# Patient Record
Sex: Male | Born: 1960 | Race: White | Hispanic: Yes | Marital: Married
Health system: Southern US, Academic
[De-identification: ages and names within clinical notes are randomized; demographics above are authoritative.]

## PROBLEM LIST (undated history)

## (undated) ENCOUNTER — Encounter

## (undated) ENCOUNTER — Telehealth

## (undated) ENCOUNTER — Ambulatory Visit: Payer: PRIVATE HEALTH INSURANCE

## (undated) ENCOUNTER — Encounter: Attending: Internal Medicine | Primary: Internal Medicine

## (undated) ENCOUNTER — Encounter: Attending: Physical Medicine & Rehabilitation | Primary: Physical Medicine & Rehabilitation

## (undated) ENCOUNTER — Telehealth: Attending: Registered" | Primary: Registered"

## (undated) ENCOUNTER — Ambulatory Visit: Attending: Internal Medicine | Primary: Internal Medicine

## (undated) ENCOUNTER — Ambulatory Visit

## (undated) ENCOUNTER — Encounter: Payer: PRIVATE HEALTH INSURANCE | Attending: Family | Primary: Family

## (undated) ENCOUNTER — Telehealth: Attending: Infectious Disease | Primary: Infectious Disease

## (undated) ENCOUNTER — Encounter: Attending: Radiation Oncology | Primary: Radiation Oncology

## (undated) ENCOUNTER — Telehealth: Attending: Clinical | Primary: Clinical

## (undated) ENCOUNTER — Encounter: Attending: Infectious Disease | Primary: Infectious Disease

## (undated) ENCOUNTER — Ambulatory Visit: Payer: PRIVATE HEALTH INSURANCE | Attending: Family | Primary: Family

## (undated) ENCOUNTER — Ambulatory Visit: Payer: PRIVATE HEALTH INSURANCE | Attending: Surgery | Primary: Surgery

## (undated) ENCOUNTER — Encounter
Payer: PRIVATE HEALTH INSURANCE | Attending: Physical Medicine & Rehabilitation | Primary: Physical Medicine & Rehabilitation

## (undated) ENCOUNTER — Non-Acute Institutional Stay: Payer: PRIVATE HEALTH INSURANCE

## (undated) ENCOUNTER — Encounter: Payer: PRIVATE HEALTH INSURANCE | Attending: Neurology | Primary: Neurology

## (undated) ENCOUNTER — Telehealth: Attending: Internal Medicine | Primary: Internal Medicine

## (undated) DIAGNOSIS — F32A Depression, unspecified: Secondary | ICD-10-CM

## (undated) DIAGNOSIS — J189 Pneumonia, unspecified organism: Secondary | ICD-10-CM

## (undated) DIAGNOSIS — F329 Major depressive disorder, single episode, unspecified: Secondary | ICD-10-CM

## (undated) DIAGNOSIS — F419 Anxiety disorder, unspecified: Secondary | ICD-10-CM

## (undated) DIAGNOSIS — K8020 Calculus of gallbladder without cholecystitis without obstruction: Secondary | ICD-10-CM

## (undated) DIAGNOSIS — F1020 Alcohol dependence, uncomplicated: Secondary | ICD-10-CM

## (undated) DIAGNOSIS — B20 Human immunodeficiency virus [HIV] disease: Secondary | ICD-10-CM

## (undated) DIAGNOSIS — N189 Chronic kidney disease, unspecified: Secondary | ICD-10-CM

## (undated) DIAGNOSIS — M1990 Unspecified osteoarthritis, unspecified site: Secondary | ICD-10-CM

## (undated) DIAGNOSIS — G510 Bell's palsy: Secondary | ICD-10-CM

## (undated) DIAGNOSIS — I639 Cerebral infarction, unspecified: Secondary | ICD-10-CM

## (undated) DIAGNOSIS — I10 Essential (primary) hypertension: Secondary | ICD-10-CM

## (undated) DIAGNOSIS — K219 Gastro-esophageal reflux disease without esophagitis: Secondary | ICD-10-CM

## (undated) DIAGNOSIS — Z21 Asymptomatic human immunodeficiency virus [HIV] infection status: Secondary | ICD-10-CM

## (undated) DIAGNOSIS — D649 Anemia, unspecified: Secondary | ICD-10-CM

## (undated) HISTORY — PX: COLONOSCOPY W/ POLYPECTOMY: SHX1380

## (undated) HISTORY — DX: Unspecified osteoarthritis, unspecified site: M19.90

## (undated) HISTORY — PX: CHOLECYSTECTOMY: SHX55

## (undated) HISTORY — DX: Alcohol dependence, uncomplicated: F10.20

## (undated) MED ORDER — GABAPENTIN 600 MG TABLET: tablet | 2 refills | 0 days

## (undated) MED ORDER — GABAPENTIN 600 MG TABLET: 900 mg | tablet | Freq: Three times a day (TID) | 2 refills | 30 days | Status: CN

## (undated) MED ORDER — NAPROXEN 500 MG TABLET,DELAYED RELEASE: Freq: Two times a day (BID) | ORAL | 0 days

---
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
## 2006-01-08 ENCOUNTER — Other Ambulatory Visit: Payer: Self-pay

## 2006-01-09 ENCOUNTER — Inpatient Hospital Stay: Payer: Self-pay | Admitting: Unknown Physician Specialty

## 2006-01-12 ENCOUNTER — Other Ambulatory Visit: Payer: Self-pay

## 2012-12-26 DIAGNOSIS — F3341 Major depressive disorder, recurrent, in partial remission: Secondary | ICD-10-CM | POA: Insufficient documentation

## 2014-03-27 DIAGNOSIS — F1010 Alcohol abuse, uncomplicated: Secondary | ICD-10-CM | POA: Insufficient documentation

## 2014-03-27 DIAGNOSIS — H9319 Tinnitus, unspecified ear: Secondary | ICD-10-CM | POA: Insufficient documentation

## 2015-02-26 DIAGNOSIS — K219 Gastro-esophageal reflux disease without esophagitis: Secondary | ICD-10-CM | POA: Insufficient documentation

## 2015-05-18 ENCOUNTER — Ambulatory Visit
Admission: EM | Admit: 2015-05-18 | Discharge: 2015-05-18 | Disposition: A | Discharge status: Home / Self Care | Payer: Self-pay | Attending: Family Medicine | Admitting: Family Medicine

## 2015-05-18 ENCOUNTER — Encounter: Payer: Self-pay | Admitting: *Deleted

## 2015-05-18 DIAGNOSIS — J411 Mucopurulent chronic bronchitis: Secondary | ICD-10-CM

## 2015-05-18 DIAGNOSIS — J0100 Acute maxillary sinusitis, unspecified: Secondary | ICD-10-CM

## 2015-05-18 DIAGNOSIS — J9801 Acute bronchospasm: Secondary | ICD-10-CM

## 2015-05-18 HISTORY — DX: Major depressive disorder, single episode, unspecified: F32.9

## 2015-05-18 HISTORY — DX: Asymptomatic human immunodeficiency virus (hiv) infection status: Z21

## 2015-05-18 HISTORY — DX: Anxiety disorder, unspecified: F41.9

## 2015-05-18 HISTORY — DX: Essential (primary) hypertension: I10

## 2015-05-18 HISTORY — DX: Depression, unspecified: F32.A

## 2015-05-18 HISTORY — DX: Human immunodeficiency virus (HIV) disease: B20

## 2015-05-18 MED ORDER — SULFAMETHOXAZOLE-TRIMETHOPRIM 800-160 MG PO TABS
1.0000 | ORAL_TABLET | Freq: Two times a day (BID) | ORAL | Status: DC
Start: 2015-05-18 — End: 2016-05-04

## 2015-05-18 MED ORDER — BENZONATATE 200 MG PO CAPS: 200.0000 mg | ORAL_CAPSULE | Freq: Three times a day (TID) | ORAL | Status: DC | PRN

## 2015-05-18 MED ORDER — ALBUTEROL SULFATE HFA 108 (90 BASE) MCG/ACT IN AERS: 1.0000 | INHALATION_SPRAY | Freq: Four times a day (QID) | RESPIRATORY_TRACT | Status: DC | PRN

## 2015-05-18 NOTE — ED Provider Notes (Signed)
CSN: [id number]     Arrival date & time 05/18/15  1219 History   First MD Initiated Contact with Patient 05/18/15 1330     Chief Complaint  Patient presents with  . Dizziness  . Cough   (Consider location/radiation/quality/duration/timing/severity/associated sxs/prior Treatment) HPI Comments: 54 yo male with chronic hypertension, HIV disease, presents  with a 2-3 weeks h/o progressively worsening productive cough, wheezing, chills, sinus pressure, nasal congestion, and headaches. States prior to these symptoms, he had "the flu". Patient is a smoker 1 ppd.   Patient is a 54 y.o. male presenting with dizziness and cough. The history is provided by the patient.  Dizziness Cough   Past Medical History  Diagnosis Date  . Hypertension   . Depression   . Anxiety   . HIV (human immunodeficiency virus infection) ([place])    History reviewed. No pertinent past surgical history. History reviewed. No pertinent family history. Social History  Substance Use Topics  . Smoking status: Current Every Day Smoker -- 1.00 packs/day    Types: Cigarettes  . Smokeless tobacco: Never Used  . Alcohol Use: Yes    Review of Systems  Respiratory: Positive for cough.   Neurological: Positive for dizziness.    Allergies  Review of patient's allergies indicates no known allergies.  Home Medications   Prior to Admission medications   Medication Sig Start Date End Date Taking? Authorizing Provider  gabapentin (NEURONTIN) 600 MG tablet Take 600 mg by mouth 3 (three) times daily.   Yes Historical Provider, MD  lisinopril-hydrochlorothiazide (PRINZIDE,ZESTORETIC) 20-25 MG tablet Take 1 tablet by mouth daily.   Yes Historical Provider, MD  omeprazole (PRILOSEC) 20 MG capsule Take 20 mg by mouth daily.   Yes Historical Provider, MD  pravastatin (PRAVACHOL) 40 MG tablet Take 40 mg by mouth daily.   Yes Historical Provider, MD  sertraline (ZOLOFT) 100 MG tablet Take 200 mg by mouth daily.   Yes Historical  Provider, MD  albuterol (PROVENTIL HFA;VENTOLIN HFA) 108 (90 BASE) MCG/ACT inhaler Inhale 1-2 puffs into the lungs every 6 (six) hours as needed for wheezing or shortness of breath. 05/18/15   Norval Gable, MD  benzonatate (TESSALON) 200 MG capsule Take 1 capsule (200 mg total) by mouth 3 (three) times daily as needed for cough. 05/18/15   Norval Gable, MD  sulfamethoxazole-trimethoprim (BACTRIM DS,SEPTRA DS) 800-160 MG tablet Take 1 tablet by mouth 2 (two) times daily. 05/18/15   Norval Gable, MD   Meds Ordered and Administered this Visit  Medications - No data to display  BP 145/70 mmHg  Pulse 91  Temp(Src) 98.9 F (37.2 C) (Oral)  Resp 18  Ht 5\' 10"  (1.778 m)  Wt 210 lb (95.255 kg)  BMI 30.13 kg/m2  SpO2 95% No data found.   Physical Exam  Constitutional: He appears well-developed and well-nourished. No distress.  HENT:  Head: Normocephalic and atraumatic.  Right Ear: Tympanic membrane, external ear and ear canal normal.  Left Ear: Tympanic membrane, external ear and ear canal normal.  Nose: Mucosal edema and rhinorrhea present.  Mouth/Throat: Uvula is midline, oropharynx is clear and moist and mucous membranes are normal. No oropharyngeal exudate, posterior oropharyngeal edema, posterior oropharyngeal erythema or tonsillar abscesses.  Eyes: Conjunctivae and EOM are normal. Pupils are equal, round, and reactive to light. Right eye exhibits no discharge. Left eye exhibits no discharge. No scleral icterus.  Neck: Normal range of motion. Neck supple. No tracheal deviation present. No thyromegaly present.  Cardiovascular: Normal rate, regular rhythm and normal  heart sounds.   Pulmonary/Chest: Effort normal. No stridor. No respiratory distress. He has wheezes. He has no rales. He exhibits no tenderness.  Diffuse rhonchi and wheezes  Lymphadenopathy:    He has no cervical adenopathy.  Neurological: He is alert.  Skin: Skin is warm and dry. No rash noted. He is not diaphoretic.   Nursing note and vitals reviewed.   ED Course  Procedures (including critical care time)  Labs Review Labs Reviewed - No data to display  Imaging Review No results found.   Visual Acuity Review  Right Eye Distance:   Left Eye Distance:   Bilateral Distance:    Right Eye Near:   Left Eye Near:    Bilateral Near:         MDM   1. Mucopurulent chronic bronchitis ([place])   2. Bronchospasm   3. Acute maxillary sinusitis, recurrence not specified    New Prescriptions   ALBUTEROL (PROVENTIL HFA;VENTOLIN HFA) 108 (90 BASE) MCG/ACT INHALER    Inhale 1-2 puffs into the lungs every 6 (six) hours as needed for wheezing or shortness of breath.   BENZONATATE (TESSALON) 200 MG CAPSULE    Take 1 capsule (200 mg total) by mouth 3 (three) times daily as needed for cough.   SULFAMETHOXAZOLE-TRIMETHOPRIM (BACTRIM DS,SEPTRA DS) 800-160 MG TABLET    Take 1 tablet by mouth 2 (two) times daily.    Plan: 1.Test/x-ray results and diagnosis reviewed with patient 2.Rx as per orders;  benefits, risks, potential side effects reviewed  3.Recommend supportive treatment with increased fluids, otc analgesics prn 4. Seek additional medical care if symptoms worsen or are not improving  Norval Gable, MD 05/18/15 1359

## 2015-05-18 NOTE — ED Notes (Signed)
Patient started having symptoms of cough and chest congestion Monday a week ago with symptoms getting worse this past Friday.  His cough have been productive, but unsure of color.

## 2016-05-04 ENCOUNTER — Emergency Department: Payer: Self-pay

## 2016-05-04 ENCOUNTER — Encounter: Payer: Self-pay | Admitting: Emergency Medicine

## 2016-05-04 ENCOUNTER — Observation Stay
Admission: EM | Admit: 2016-05-04 | Discharge: 2016-05-05 | Disposition: A | Discharge status: Home / Self Care | Payer: Self-pay | Attending: Specialist | Admitting: Specialist

## 2016-05-04 DIAGNOSIS — M6281 Muscle weakness (generalized): Secondary | ICD-10-CM

## 2016-05-04 DIAGNOSIS — I10 Essential (primary) hypertension: Secondary | ICD-10-CM | POA: Insufficient documentation

## 2016-05-04 DIAGNOSIS — Z7982 Long term (current) use of aspirin: Secondary | ICD-10-CM | POA: Insufficient documentation

## 2016-05-04 DIAGNOSIS — Z8249 Family history of ischemic heart disease and other diseases of the circulatory system: Secondary | ICD-10-CM | POA: Insufficient documentation

## 2016-05-04 DIAGNOSIS — I639 Cerebral infarction, unspecified: Secondary | ICD-10-CM

## 2016-05-04 DIAGNOSIS — E781 Pure hyperglyceridemia: Secondary | ICD-10-CM | POA: Insufficient documentation

## 2016-05-04 DIAGNOSIS — F329 Major depressive disorder, single episode, unspecified: Secondary | ICD-10-CM | POA: Insufficient documentation

## 2016-05-04 DIAGNOSIS — F419 Anxiety disorder, unspecified: Secondary | ICD-10-CM | POA: Insufficient documentation

## 2016-05-04 DIAGNOSIS — G510 Bell's palsy: Secondary | ICD-10-CM | POA: Insufficient documentation

## 2016-05-04 DIAGNOSIS — Z79899 Other long term (current) drug therapy: Secondary | ICD-10-CM | POA: Insufficient documentation

## 2016-05-04 DIAGNOSIS — Z809 Family history of malignant neoplasm, unspecified: Secondary | ICD-10-CM | POA: Insufficient documentation

## 2016-05-04 DIAGNOSIS — K219 Gastro-esophageal reflux disease without esophagitis: Secondary | ICD-10-CM | POA: Insufficient documentation

## 2016-05-04 DIAGNOSIS — R200 Anesthesia of skin: Secondary | ICD-10-CM

## 2016-05-04 DIAGNOSIS — E785 Hyperlipidemia, unspecified: Secondary | ICD-10-CM | POA: Insufficient documentation

## 2016-05-04 DIAGNOSIS — Z21 Asymptomatic human immunodeficiency virus [HIV] infection status: Secondary | ICD-10-CM | POA: Insufficient documentation

## 2016-05-04 DIAGNOSIS — G459 Transient cerebral ischemic attack, unspecified: Principal | ICD-10-CM

## 2016-05-04 DIAGNOSIS — Z23 Encounter for immunization: Secondary | ICD-10-CM | POA: Insufficient documentation

## 2016-05-04 DIAGNOSIS — F17210 Nicotine dependence, cigarettes, uncomplicated: Secondary | ICD-10-CM | POA: Insufficient documentation

## 2016-05-04 DIAGNOSIS — F1010 Alcohol abuse, uncomplicated: Secondary | ICD-10-CM | POA: Insufficient documentation

## 2016-05-04 LAB — APTT: aPTT: 26 seconds (ref 24–36)

## 2016-05-04 LAB — COMPREHENSIVE METABOLIC PANEL
ALT: 28 U/L (ref 17–63)
ANION GAP: 10 (ref 5–15)
AST: 32 U/L (ref 15–41)
Albumin: 4.5 g/dL (ref 3.5–5.0)
Alkaline Phosphatase: 60 U/L (ref 38–126)
BILIRUBIN TOTAL: 0.5 mg/dL (ref 0.3–1.2)
BUN: 22 mg/dL — AB (ref 6–20)
CO2: 28 mmol/L (ref 22–32)
Calcium: 9.5 mg/dL (ref 8.9–10.3)
Chloride: 102 mmol/L (ref 101–111)
Creatinine, Ser: 1.23 mg/dL (ref 0.61–1.24)
GFR calc Af Amer: >60 mL/min (ref >60)
Glucose, Bld: 100 mg/dL — ABNORMAL HIGH (ref 65–99)
POTASSIUM: 4.4 mmol/L (ref 3.5–5.1)
Sodium: 140 mmol/L (ref 135–145)
TOTAL PROTEIN: 8.3 g/dL — AB (ref 6.5–8.1)

## 2016-05-04 LAB — DIFFERENTIAL
Basophils Absolute: 0 10*3/uL (ref 0–0.1)
Basophils Relative: 0 %
EOS ABS: 0.1 10*3/uL (ref 0–0.7)
EOS PCT: 1 %
LYMPHS ABS: 1.9 10*3/uL (ref 1.0–3.6)
Lymphocytes Relative: 28 %
MONO ABS: 0.5 10*3/uL (ref 0.2–1.0)
MONOS PCT: 8 %
Neutro Abs: 4.3 10*3/uL (ref 1.4–6.5)
Neutrophils Relative %: 63 %

## 2016-05-04 LAB — PROTIME-INR
INR: 0.87
Prothrombin Time: 11.8 seconds (ref 11.4–15.2)

## 2016-05-04 LAB — CBC
HEMATOCRIT: 44.6 % (ref 40.0–52.0)
HEMOGLOBIN: 15.3 g/dL (ref 13.0–18.0)
MCH: 33.4 pg (ref 26.0–34.0)
MCHC: 34.2 g/dL (ref 32.0–36.0)
MCV: 97.4 fL (ref 80.0–100.0)
Platelets: 148 10*3/uL — ABNORMAL LOW (ref 150–440)
RBC: 4.58 MIL/uL (ref 4.40–5.90)
RDW: 14.9 % — ABNORMAL HIGH (ref 11.5–14.5)
WBC: 6.8 10*3/uL (ref 3.8–10.6)

## 2016-05-04 LAB — GLUCOSE, CAPILLARY: Glucose-Capillary: 110 mg/dL — ABNORMAL HIGH (ref 65–99)

## 2016-05-04 LAB — TROPONIN I

## 2016-05-04 LAB — LIPID PANEL
Cholesterol: 215 mg/dL — ABNORMAL HIGH (ref 0–200)
HDL: 72 mg/dL (ref >40)
LDL CALC: UNDETERMINED mg/dL (ref 0–99)
Total CHOL/HDL Ratio: 3 RATIO
Triglycerides: 488 mg/dL — ABNORMAL HIGH (ref <150)
VLDL: UNDETERMINED mg/dL (ref 0–40)

## 2016-05-04 MED ORDER — LORAZEPAM 2 MG PO TABS: 2.0000 mg | ORAL_TABLET | ORAL | Status: DC | PRN

## 2016-05-04 MED ORDER — MIRTAZAPINE 15 MG PO TABS
30.0000 mg | ORAL_TABLET | Freq: Every day | ORAL | Status: DC
  Administered 2016-05-04: 30 mg via ORAL
  Filled 2016-05-04: qty 2

## 2016-05-04 MED ORDER — LORAZEPAM 2 MG/ML IJ SOLN: 0.0000 mg | INTRAMUSCULAR | Status: DC

## 2016-05-04 MED ORDER — LORAZEPAM 2 MG/ML IJ SOLN: 0.0000 mg | Freq: Two times a day (BID) | INTRAMUSCULAR | Status: DC

## 2016-05-04 MED ORDER — ADULT MULTIVITAMIN W/MINERALS CH
1.0000 | ORAL_TABLET | Freq: Every day | ORAL | Status: DC
  Administered 2016-05-04 – 2016-05-05 (×2): 1 via ORAL
  Filled 2016-05-04 (×2): qty 1

## 2016-05-04 MED ORDER — INFLUENZA VAC SPLIT QUAD 0.5 ML IM SUSY
0.5000 mL | PREFILLED_SYRINGE | INTRAMUSCULAR | Status: AC
  Administered 2016-05-05: 09:00:00 0.5 mL via INTRAMUSCULAR
  Filled 2016-05-04: qty 0.5

## 2016-05-04 MED ORDER — NALTREXONE HCL 50 MG PO TABS
50.0000 mg | ORAL_TABLET | Freq: Every day | ORAL | Status: DC
  Administered 2016-05-05: 50 mg via ORAL
  Filled 2016-05-04: qty 1

## 2016-05-04 MED ORDER — THIAMINE HCL 100 MG/ML IJ SOLN
100.0000 mg | Freq: Every day | INTRAMUSCULAR | Status: DC
  Filled 2016-05-04 (×2): qty 1

## 2016-05-04 MED ORDER — ASPIRIN EC 81 MG PO TBEC
81.0000 mg | DELAYED_RELEASE_TABLET | Freq: Every day | ORAL | Status: DC
  Administered 2016-05-05: 09:00:00 81 mg via ORAL
  Filled 2016-05-04: qty 1

## 2016-05-04 MED ORDER — FOLIC ACID 1 MG PO TABS
1.0000 mg | ORAL_TABLET | Freq: Every day | ORAL | Status: DC
  Administered 2016-05-04 – 2016-05-05 (×2): 1 mg via ORAL
  Filled 2016-05-04 (×2): qty 1

## 2016-05-04 MED ORDER — LORAZEPAM 2 MG/ML IJ SOLN: 0.0000 mg | Freq: Four times a day (QID) | INTRAMUSCULAR | Status: DC

## 2016-05-04 MED ORDER — ELVITEG-COBIC-EMTRICIT-TENOFAF 150-150-200-10 MG PO TABS
1.0000 | ORAL_TABLET | Freq: Every day | ORAL | Status: DC
  Administered 2016-05-05: 1 via ORAL
  Filled 2016-05-04: qty 1

## 2016-05-04 MED ORDER — HYDROCHLOROTHIAZIDE 25 MG PO TABS
25.0000 mg | ORAL_TABLET | Freq: Every day | ORAL | Status: DC
  Administered 2016-05-05: 25 mg via ORAL
  Filled 2016-05-04: qty 1

## 2016-05-04 MED ORDER — PANTOPRAZOLE SODIUM 40 MG PO TBEC
40.0000 mg | DELAYED_RELEASE_TABLET | Freq: Every day | ORAL | Status: DC
  Administered 2016-05-05: 40 mg via ORAL
  Filled 2016-05-04: qty 1

## 2016-05-04 MED ORDER — SERTRALINE HCL 50 MG PO TABS
200.0000 mg | ORAL_TABLET | Freq: Every day | ORAL | Status: DC
  Administered 2016-05-05: 09:00:00 200 mg via ORAL
  Filled 2016-05-04: qty 4

## 2016-05-04 MED ORDER — LORAZEPAM 2 MG/ML IJ SOLN: 1.0000 mg | Freq: Four times a day (QID) | INTRAMUSCULAR | Status: DC | PRN

## 2016-05-04 MED ORDER — SODIUM CHLORIDE 0.9% FLUSH
3.0000 mL | Freq: Two times a day (BID) | INTRAVENOUS | Status: DC
  Administered 2016-05-04 – 2016-05-05 (×2): 3 mL via INTRAVENOUS

## 2016-05-04 MED ORDER — HEPARIN SODIUM (PORCINE) 5000 UNIT/ML IJ SOLN
5000.0000 [IU] | Freq: Three times a day (TID) | INTRAMUSCULAR | Status: DC
  Administered 2016-05-04 – 2016-05-05 (×3): 5000 [IU] via SUBCUTANEOUS
  Filled 2016-05-04 (×3): qty 1

## 2016-05-04 MED ORDER — ZINC SULFATE 220 (50 ZN) MG PO CAPS
220.0000 mg | ORAL_CAPSULE | Freq: Every day | ORAL | Status: DC
  Administered 2016-05-04 – 2016-05-05 (×2): 220 mg via ORAL
  Filled 2016-05-04 (×2): qty 1

## 2016-05-04 MED ORDER — VITAMIN B-1 100 MG PO TABS
100.0000 mg | ORAL_TABLET | Freq: Every day | ORAL | Status: DC
  Administered 2016-05-04 – 2016-05-05 (×2): 100 mg via ORAL
  Filled 2016-05-04 (×2): qty 1

## 2016-05-04 MED ORDER — LISINOPRIL 20 MG PO TABS
40.0000 mg | ORAL_TABLET | Freq: Every day | ORAL | Status: DC
Start: 2016-05-05 — End: 2016-05-05
  Administered 2016-05-05: 09:00:00 40 mg via ORAL
  Filled 2016-05-04: qty 2

## 2016-05-04 MED ORDER — GABAPENTIN 300 MG PO CAPS
600.0000 mg | ORAL_CAPSULE | Freq: Three times a day (TID) | ORAL | Status: DC
  Administered 2016-05-04 – 2016-05-05 (×3): 600 mg via ORAL
  Filled 2016-05-04 (×3): qty 2

## 2016-05-04 MED ORDER — FLUCONAZOLE 100 MG PO TABS: 150.0000 mg | ORAL_TABLET | ORAL | Status: DC

## 2016-05-04 MED ORDER — PRAVASTATIN SODIUM 40 MG PO TABS
40.0000 mg | ORAL_TABLET | Freq: Every day | ORAL | Status: DC
  Administered 2016-05-05: 09:00:00 40 mg via ORAL
  Filled 2016-05-04: qty 1

## 2016-05-04 NOTE — ED Triage Notes (Signed)
Pt presents with reports of inabilty to close his right eye when he wants to, right side facial droop and numbness and tingling to his left arm.

## 2016-05-04 NOTE — Progress Notes (Signed)
Chaplain was responding to a code stroke pager by a nurse. When the chaplain arrived the patient had been taken in have a CT scan, and when they brought him back, the chaplain located his son who was waiting outside to came and see his Dad. Chaplain offered silent prayer and was to do a follow-up.      05/04/16 1353  Clinical Encounter Type  Visited With Patient  Visit Type Spiritual support  Consult/Referral To Nurse  Spiritual Encounters  Spiritual Needs Prayer  Advance Directives (For Healthcare)  Does patient have an advance directive? No

## 2016-05-04 NOTE — H&P (Signed)
[facility] [place] NAME: Daryl Shelton    MR#:  [id number]  DATE OF BIRTH:  09-Feb-1961  DATE OF ADMISSION:  05/04/2016  PRIMARY CARE PHYSICIAN: Foye Spurling, MD   REQUESTING/REFERRING PHYSICIAN: Hoyle Sauer  CHIEF COMPLAINT:   Chief Complaint  Patient presents with  . Code Stroke    HISTORY OF PRESENT ILLNESS: Daryl Shelton  is a 54 y.o. male with a known history of HIV, Htn, Anxiety, Ch smoking and drinking alcohol, Came to ER after having left sided facial weakness noticed today morning, then also had weakness in left arm- which recovered now. He claims to have arm weakness in past also. In ER, CT head and MRI brain are negative- Seen by neurologist- Dr. Jules Husbands- and as per ER physicians- she suggested to admit for TIA work ups.  PAST MEDICAL HISTORY:   Past Medical History:  Diagnosis Date  . Anxiety   . Depression   . HIV (human immunodeficiency virus infection) ([place])   . Hypertension     PAST SURGICAL HISTORY: History reviewed. No pertinent surgical history.  SOCIAL HISTORY:  Social History  Substance Use Topics  . Smoking status: Current Every Day Smoker    Packs/day: 1.00    Types: Cigarettes  . Smokeless tobacco: Never Used  . Alcohol use Yes    FAMILY HISTORY:  Family History  Problem Relation Age of Onset  . CAD Father     DRUG ALLERGIES: No Known Allergies  REVIEW OF SYSTEMS:   CONSTITUTIONAL: No fever, fatigue or weakness.  EYES: No blurred or double vision.  EARS, NOSE, AND THROAT: No tinnitus or ear pain.  RESPIRATORY: No cough, shortness of breath, wheezing or hemoptysis.  CARDIOVASCULAR: No chest pain, orthopnea, edema.  GASTROINTESTINAL: No nausea, vomiting, diarrhea or abdominal pain.  GENITOURINARY: No dysuria, hematuria.  ENDOCRINE: No polyuria, nocturia,  HEMATOLOGY: No anemia, easy bruising or bleeding SKIN: No rash or lesion. MUSCULOSKELETAL: No joint pain or arthritis.   NEUROLOGIC: No  tingling, numbness, weakness.  PSYCHIATRY: No anxiety or depression.   MEDICATIONS AT HOME:  Prior to Admission medications   Medication Sig Start Date End Date Taking? Authorizing Provider  aspirin EC 81 MG tablet Take 81 mg by mouth daily.   Yes Historical Provider, MD  elvitegravir-cobicistat-emtricitabine-tenofovir (GENVOYA) 150-150-200-10 MG TABS tablet Take 1 tablet by mouth daily with breakfast.   Yes Historical Provider, MD  fluconazole (DIFLUCAN) 150 MG tablet Take 150 mg by mouth once a week.   Yes Historical Provider, MD  gabapentin (NEURONTIN) 600 MG tablet Take 600 mg by mouth 3 (three) times daily.   Yes Historical Provider, MD  hydrochlorothiazide (HYDRODIURIL) 25 MG tablet Take 25 mg by mouth daily.   Yes Historical Provider, MD  lisinopril (PRINIVIL,ZESTRIL) 40 MG tablet Take 40 mg by mouth daily.   Yes Historical Provider, MD  mirtazapine (REMERON) 30 MG tablet Take 30 mg by mouth at bedtime.   Yes Historical Provider, MD  naltrexone (DEPADE) 50 MG tablet Take 50 mg by mouth daily.   Yes Historical Provider, MD  omeprazole (PRILOSEC) 20 MG capsule Take 20 mg by mouth daily.   Yes Historical Provider, MD  pravastatin (PRAVACHOL) 40 MG tablet Take 40 mg by mouth daily.   Yes Historical Provider, MD  sertraline (ZOLOFT) 100 MG tablet Take 200 mg by mouth daily.   Yes Historical Provider, MD  zinc gluconate 50 MG tablet Take 50 mg by mouth daily.   Yes Historical Provider,  MD      PHYSICAL EXAMINATION:   VITAL SIGNS: Blood pressure (!) 148/90, pulse 82, temperature 98.1 F (36.7 C), resp. rate 18, height 5\' 10"  (1.778 m), weight 99.8 kg (220 lb), SpO2 100 %.  GENERAL:  55 y.o.-year-old patient lying in the bed with no acute distress.  EYES: Pupils equal, round, reactive to light and accommodation. No scleral icterus. Extraocular muscles intact.  HEENT: Head atraumatic, normocephalic. Oropharynx and nasopharynx clear.  NECK:  Supple, no jugular venous distention. No thyroid  enlargement, no tenderness.  LUNGS: Normal breath sounds bilaterally, no wheezing, rales,rhonchi or crepitation. No use of accessory muscles of respiration.  CARDIOVASCULAR: S1, S2 normal. No murmurs, rubs, or gallops.  ABDOMEN: Soft, nontender, nondistended. Bowel sounds present. No organomegaly or mass.  EXTREMITIES: No pedal edema, cyanosis, or clubbing.  NEUROLOGIC: Cranial nerves II through XII are intact except mild weakness on left face . Muscle strength 5/5 in all extremities. Sensation intact. Gait not checked.  PSYCHIATRIC: The patient is alert and oriented x 3.  SKIN: No obvious rash, lesion, or ulcer.   LABORATORY PANEL:   CBC  Recent Labs Lab 05/04/16 1351  WBC 6.8  HGB 15.3  HCT 44.6  PLT 148*  MCV 97.4  MCH 33.4  MCHC 34.2  RDW 14.9*  LYMPHSABS 1.9  MONOABS 0.5  EOSABS 0.1  BASOSABS 0.0   ------------------------------------------------------------------------------------------------------------------  Chemistries   Recent Labs Lab 05/04/16 1351  NA 140  K 4.4  CL 102  CO2 28  GLUCOSE 100*  BUN 22*  CREATININE 1.23  CALCIUM 9.5  AST 32  ALT 28  ALKPHOS 60  BILITOT 0.5   ------------------------------------------------------------------------------------------------------------------ estimated creatinine clearance is 79.4 mL/min (by C-G formula based on SCr of 1.23 mg/dL). ------------------------------------------------------------------------------------------------------------------ No results for input(s): TSH, T4TOTAL, T3FREE, THYROIDAB in the last 72 hours.  Invalid input(s): FREET3   Coagulation profile  Recent Labs Lab 05/04/16 1351  INR 0.87   ------------------------------------------------------------------------------------------------------------------- No results for input(s): DDIMER in the last 72  hours. -------------------------------------------------------------------------------------------------------------------  Cardiac Enzymes  Recent Labs Lab 05/04/16 1351  TROPONINI <0.03   ------------------------------------------------------------------------------------------------------------------ Invalid input(s): POCBNP  ---------------------------------------------------------------------------------------------------------------  Urinalysis No results found for: COLORURINE, APPEARANCEUR, LABSPEC, PHURINE, GLUCOSEU, HGBUR, BILIRUBINUR, KETONESUR, PROTEINUR, UROBILINOGEN, NITRITE, LEUKOCYTESUR   RADIOLOGY: Mr Brain Wo Contrast  Result Date: 05/04/2016 CLINICAL DATA:  55 year old male with right side facial droop and numbness. Unable to close right eye. Tingling in the left upper extremity. Initial encounter. EXAM: MRI HEAD WITHOUT CONTRAST TECHNIQUE: Multiplanar, multiecho pulse sequences of the brain and surrounding structures were obtained without intravenous contrast. COMPARISON:  Head CT without contrast 1342 hours today. FINDINGS: Brain: No restricted diffusion to suggest acute infarction. No midline shift, mass effect, evidence of mass lesion, ventriculomegaly, extra-axial collection or acute intracranial hemorrhage. Cervicomedullary junction and pituitary are within normal limits. Pearline Cables and white matter signal is within normal limits for age throughout the brain. No cortical encephalomalacia. No chronic cerebral blood products. Visible internal auditory structures appear normal. Stylomastoid foramina appear normal. See parotid gland findings below. Vascular: Major intracranial vascular flow voids appear normal. Skull and upper cervical spine: Negative. Normal bone marrow signal. Sinuses/Orbits: Visualized paranasal sinuses and mastoids are stable and well pneumatized. Orbits soft tissues appear normal. Negative scalp soft tissues. Other: Normal left parotid gland. Along the  posterior margin of the right parotid space there is a 17 by 12 by 24 mm (AP by transverse by CC) soft tissue nodule with heterogeneous T1 and T2 signal. This is circumscribed. The  remainder of the right parotid gland appears normal. Visible face soft tissues appear normal. IMPRESSION: 1. No acute intracranial abnormality. Normal for age noncontrast MRI appearance of the brain. 2. Posterior right parotid space small 2.4 cm soft tissue nodule. This appears unrelated to the presentation of right facial weakness, and benign etiology (such as Warthin's or Benign Mixed Tumor) is favored. Recommend outpatient follow-up with ENT. Electronically Signed   By: Genevie Ann M.D.   On: 05/04/2016 15:02   Ct Head Code Stroke W/o Cm  Result Date: 05/04/2016 CLINICAL DATA:  Code stroke. Left-sided facial droop beginning 7.5 hours ago. EXAM: CT HEAD WITHOUT CONTRAST TECHNIQUE: Contiguous axial images were obtained from the base of the skull through the vertex without intravenous contrast. COMPARISON:  None. FINDINGS: Brain: No acute infarct, hemorrhage, or mass lesion is present. Basal ganglia are intact. Insular ribbon is normal. No focal cortical defect is present. Mild atrophy is within normal limits for age. The ventricles are of normal size. No significant extra-axial fluid collection is present. The brainstem and cerebellum are normal. Vascular: No significant vascular calcifications are present. Skull: The calvarium is intact. Sinuses/Orbits: The paranasal sinuses and mastoid air cells are clear. Other: ASPECTS ([place] Stroke Program Early CT Score) - Ganglionic level infarction (caudate, lentiform nuclei, internal capsule, insula, M1-M3 cortex): 7/7 - Supraganglionic infarction (M4-M6 cortex): 3/3 Total score (0-10 with 10 being normal): 10/10 IMPRESSION: 1. Normal CT of the brain. 2. ASPECTS is 10/10 These results were called by telephone at the time of interpretation on 05/04/2016 at 1:54 pm to Dr. Doy Mince , who verbally  acknowledged these results. Electronically Signed   By: San Morelle M.D.   On: 05/04/2016 13:55    EKG: Orders placed or performed during the hospital encounter of 05/04/16  . ED EKG  . ED EKG  . EKG 12-Lead  . EKG 12-Lead    IMPRESSION AND PLAN:  * TIA   MRI negative   Echo, Carotid doppler, PT eval, HbA1c and lipid panel.  * Htn   Cont home meds  * HIV   Cont home meds, follows at [facility]  * Active smoking   Counceleld to quit smoking for 4 min, give nicotin patch in hospital  * Alcohol abuse   High risk for withdrawal.   Watch for symptoms, keep on CIWA    All the records are reviewed and case discussed with ED provider. Management plans discussed with the patient, family and they are in agreement.  CODE STATUS: FUll Code Status History    This patient does not have a recorded code status. Please follow your organizational policy for patients in this situation.       TOTAL TIME TAKING CARE OF THIS PATIENT: 45 minutes.    Vaughan Basta M.D on 05/04/2016   Between 7am to 6pm - Pager - [phone]  After 6pm go to [email or web address] - password [email or web address] [facility]  Office  [phone]  CC: Primary care physician; Foye Spurling, MD   Note: This dictation was prepared with Dragon dictation along with smaller phrase technology. Any transcriptional errors that result from this process are unintentional.

## 2016-05-04 NOTE — ED Provider Notes (Signed)
[facility] Emergency Department Provider Note  ____________________________________________  Time seen: Approximately 3:16 PM  I have reviewed the triage vital signs and the nursing notes.   HISTORY  Chief Complaint Code Stroke   HPI Daryl Shelton is a 55 y.o. male history of HIV, hypertension, anxiety, depression who presents for evaluation of left-sided facial droop and left-sided face and arm numbness. Patient reports that onset of symptoms was at 5:40 AM. He denies headache, chest pain, shortness of breath, palpitations, dizziness, gait instability. No prior history of stroke. No family history of stroke. Patient is a smoker. Patient was made a code STROKE on arrival.  Past Medical History:  Diagnosis Date  . Anxiety   . Depression   . HIV (human immunodeficiency virus infection) ([place])   . Hypertension     There are no active problems to display for this patient.   History reviewed. No pertinent surgical history.  Prior to Admission medications   Medication Sig Start Date End Date Taking? Authorizing Provider  aspirin EC 81 MG tablet Take 81 mg by mouth daily.   Yes Historical Provider, MD  elvitegravir-cobicistat-emtricitabine-tenofovir (GENVOYA) 150-150-200-10 MG TABS tablet Take 1 tablet by mouth daily with breakfast.   Yes Historical Provider, MD  fluconazole (DIFLUCAN) 150 MG tablet Take 150 mg by mouth once a week.   Yes Historical Provider, MD  gabapentin (NEURONTIN) 600 MG tablet Take 600 mg by mouth 3 (three) times daily.   Yes Historical Provider, MD  hydrochlorothiazide (HYDRODIURIL) 25 MG tablet Take 25 mg by mouth daily.   Yes Historical Provider, MD  lisinopril (PRINIVIL,ZESTRIL) 40 MG tablet Take 40 mg by mouth daily.   Yes Historical Provider, MD  mirtazapine (REMERON) 30 MG tablet Take 30 mg by mouth at bedtime.   Yes Historical Provider, MD  naltrexone (DEPADE) 50 MG tablet Take 50 mg by mouth daily.   Yes Historical  Provider, MD  omeprazole (PRILOSEC) 20 MG capsule Take 20 mg by mouth daily.   Yes Historical Provider, MD  pravastatin (PRAVACHOL) 40 MG tablet Take 40 mg by mouth daily.   Yes Historical Provider, MD  sertraline (ZOLOFT) 100 MG tablet Take 200 mg by mouth daily.   Yes Historical Provider, MD  zinc gluconate 50 MG tablet Take 50 mg by mouth daily.   Yes Historical Provider, MD    Allergies Review of patient's allergies indicates no known allergies.  No family history on file.  Social History Social History  Substance Use Topics  . Smoking status: Current Every Day Smoker    Packs/day: 1.00    Types: Cigarettes  . Smokeless tobacco: Never Used  . Alcohol use Yes    Review of Systems  Constitutional: Negative for fever. Eyes: Negative for visual changes. ENT: Negative for sore throat. Cardiovascular: Negative for chest pain. Respiratory: Negative for shortness of breath. Gastrointestinal: Negative for abdominal pain, vomiting or diarrhea. Genitourinary: Negative for dysuria. Musculoskeletal: Negative for back pain. Skin: Negative for rash. Neurological: Negative for headaches. + R facial droop and R sided numbness. No  weakness   ____________________________________________   PHYSICAL EXAM:  VITAL SIGNS: ED Triage Vitals  Enc Vitals Group     BP 05/04/16 1349 (!) 196/106     Pulse Rate 05/04/16 1349 92     Resp 05/04/16 1349 17     Temp 05/04/16 1349 98.1 F (36.7 C)     Temp Source 05/04/16 1349 Oral     SpO2 05/04/16 1349 100 %  Weight 05/04/16 1351 220 lb (99.8 kg)     Height 05/04/16 1351 5\' 10"  (1.778 m)     Head Circumference --      Peak Flow --      Pain Score --      Pain Loc --      Pain Edu? --      Excl. in [place]? --     Constitutional: Alert and oriented. Well appearing and in no apparent distress. HEENT:      Head: Normocephalic and atraumatic.         Eyes: Conjunctivae are normal. Sclera is non-icteric. EOMI. PERRL      Mouth/Throat:  Mucous membranes are moist.       Neck: Supple with no signs of meningismus. Cardiovascular: Regular rate and rhythm. No murmurs, gallops, or rubs. 2+ symmetrical distal pulses are present in all extremities. No JVD. Respiratory: Normal respiratory effort. Lungs are clear to auscultation bilaterally. No wheezes, crackles, or rhonchi.  Gastrointestinal: Soft, non tender, and non distended with positive bowel sounds. No rebound or guarding. Genitourinary: No CVA tenderness. Musculoskeletal: Nontender with normal range of motion in all extremities. No edema, cyanosis, or erythema of extremities. Neurologic: Normal speech and language. Left sided facial droop, remaining of CN II-XII intact. Strength and sensation intact. Negative dysmetria and pronator drift Skin: Skin is warm, dry and intact. No rash noted. Psychiatric: Mood and affect are normal. Speech and behavior are normal.  ____________________________________________   LABS (all labs ordered are listed, but only abnormal results are displayed)  Labs Reviewed  CBC - Abnormal; Notable for the following:       Result Value   RDW 14.9 (*)    Platelets 148 (*)    All other components within normal limits  COMPREHENSIVE METABOLIC PANEL - Abnormal; Notable for the following:    Glucose, Bld 100 (*)    BUN 22 (*)    Total Protein 8.3 (*)    All other components within normal limits  GLUCOSE, CAPILLARY - Abnormal; Notable for the following:    Glucose-Capillary 110 (*)    All other components within normal limits  PROTIME-INR  APTT  DIFFERENTIAL  TROPONIN I  CBG MONITORING, ED   ____________________________________________  EKG   ED ECG REPORT I, Rudene Re, the attending physician, personally viewed and interpreted this ECG.  Normal sinus rhythm, rate of 95, normal intervals, normal axis, no ST elevations or depressions, unchanged from prior. ____________________________________________  RADIOLOGY  Head CT:  Negative  MRI brain:  No acute intracranial abnormality. Normal for age noncontrast MRI appearance of the brain. ____________________________________________   PROCEDURES  Procedure(s) performed: None Procedures Critical Care performed:  None ____________________________________________   INITIAL IMPRESSION / ASSESSMENT AND PLAN / ED COURSE   55 y.o. male history of HIV, hypertension, anxiety, depression who presents for evaluation of left-sided facial droop and left-sided face and arm numbness. NIHSS 2 on arrival. Patient outside tPA window. The CT had an MRI with no acute findings. Patient evaluated by neurology who recommended admission for TIA workup. Discussed patient with the hospitalist for admission.  Clinical Course    Pertinent labs & imaging results that were available during my care of the patient were reviewed by me and considered in my medical decision making (see chart for details).    ____________________________________________   FINAL CLINICAL IMPRESSION(S) / ED DIAGNOSES  Final diagnoses:  Left sided numbness      NEW MEDICATIONS STARTED DURING THIS VISIT:  New Prescriptions  No medications on file     Note:  This document was prepared using Dragon voice recognition software and may include unintentional dictation errors.    Rudene Re, MD 05/04/16 [phone]

## 2016-05-04 NOTE — Consult Note (Signed)
Referring Physician: Alfred Levins    Chief Complaint: Left sided numbness  HPI: Daryl Shelton is a HIV positive 55 y.o. male with a history of hypertension who presents with acute onset of left facial droop with left face and arm paresthesias.  Symptoms have persisted throughout the day and with no improvement patient presented for evaluation.  Initial NIHSS of 2.    Date last known well: Date: 05/04/2016 Time last known well: Time: 05:40 tPA Given: No: Outside time window  MRankin: 0  Past Medical History:  Diagnosis Date  . Anxiety   . Depression   . HIV (human immunodeficiency virus infection) ([place])   . Hypertension     History reviewed. No pertinent surgical history.  Family history: Mother with cancer and hypertension.  Father with hypertension.  Maternal grandfather with HTN and heart disease  Social History:  reports that he has been smoking Cigarettes.  He has been smoking about 1.00 pack per day. He has never used smokeless tobacco. He reports that he drinks alcohol. He reports that he does not use drugs.  Allergies: No Known Allergies  Medications: I have reviewed the patient's current medications. Prior to Admission:  Prior to Admission medications   Medication Sig Start Date End Date Taking? Authorizing Provider  aspirin EC 81 MG tablet Take 81 mg by mouth daily.   Yes Historical Provider, MD  elvitegravir-cobicistat-emtricitabine-tenofovir (GENVOYA) 150-150-200-10 MG TABS tablet Take 1 tablet by mouth daily with breakfast.   Yes Historical Provider, MD  fluconazole (DIFLUCAN) 150 MG tablet Take 150 mg by mouth once a week.   Yes Historical Provider, MD  gabapentin (NEURONTIN) 600 MG tablet Take 600 mg by mouth 3 (three) times daily.   Yes Historical Provider, MD  hydrochlorothiazide (HYDRODIURIL) 25 MG tablet Take 25 mg by mouth daily.   Yes Historical Provider, MD  lisinopril (PRINIVIL,ZESTRIL) 40 MG tablet Take 40 mg by mouth daily.   Yes Historical Provider, MD   mirtazapine (REMERON) 30 MG tablet Take 30 mg by mouth at bedtime.   Yes Historical Provider, MD  naltrexone (DEPADE) 50 MG tablet Take 50 mg by mouth daily.   Yes Historical Provider, MD  omeprazole (PRILOSEC) 20 MG capsule Take 20 mg by mouth daily.   Yes Historical Provider, MD  pravastatin (PRAVACHOL) 40 MG tablet Take 40 mg by mouth daily.   Yes Historical Provider, MD  sertraline (ZOLOFT) 100 MG tablet Take 200 mg by mouth daily.   Yes Historical Provider, MD  zinc gluconate 50 MG tablet Take 50 mg by mouth daily.   Yes Historical Provider, MD   ROS: History obtained from the patient  General ROS: negative for - chills, fatigue, fever, night sweats, weight gain or weight loss Psychological ROS: negative for - behavioral disorder, hallucinations, memory difficulties, mood swings or suicidal ideation Ophthalmic ROS: negative for - blurry vision, double vision, eye pain or loss of vision ENT ROS: negative for - epistaxis, nasal discharge, oral lesions, sore throat, tinnitus or vertigo Allergy and Immunology ROS: negative for - hives or itchy/watery eyes Hematological and Lymphatic ROS: negative for - bleeding problems, bruising or swollen lymph nodes Endocrine ROS: negative for - galactorrhea, hair pattern changes, polydipsia/polyuria or temperature intolerance Respiratory ROS: negative for - cough, hemoptysis, shortness of breath or wheezing Cardiovascular ROS: negative for - chest pain, dyspnea on exertion, edema or irregular heartbeat Gastrointestinal ROS: negative for - abdominal pain, diarrhea, hematemesis, nausea/vomiting or stool incontinence Genito-Urinary ROS: negative for - dysuria, hematuria, incontinence or urinary  frequency/urgency Musculoskeletal ROS: negative for - joint swelling or muscular weakness Neurological ROS: as noted in HPI Dermatological ROS: negative for rash and skin lesion changes  Physical Examination: Blood pressure (!) 196/106, pulse 92, temperature 98.1  F (36.7 C), resp. rate 17, height 5\' 10"  (1.778 m), weight 99.8 kg (220 lb), SpO2 100 %.  HEENT-  Normocephalic, no lesions, without obvious abnormality.  Normal external eye and conjunctiva.  Normal TM's bilaterally.  Normal auditory canals and external ears. Normal external nose, mucus membranes and septum.  Normal pharynx. Cardiovascular- S1, S2 normal, pulses palpable throughout   Lungs- chest clear, no wheezing, rales, normal symmetric air entry Abdomen- soft, non-tender; bowel sounds normal; no masses,  no organomegaly Extremities- minimal LE edema Lymph-no adenopathy palpable Musculoskeletal-no joint tenderness, deformity or swelling Skin-warm and dry, no hyperpigmentation, vitiligo, or suspicious lesions  Neurological Examination Mental Status: Alert, oriented, thought content appropriate.  Speech fluent without evidence of aphasia.  Able to follow 3 step commands without difficulty. Cranial Nerves: II: Discs flat bilaterally; Visual fields grossly normal, pupils equal, round, reactive to light and accommodation III,IV, VI: ptosis not present, extra-ocular motions intact bilaterally V,VII: smile symmetric, facial light touch sensation decreased on the left  VIII: hearing normal bilaterally IX,X: gag reflex present XI: bilateral shoulder shrug XII: midline tongue extension Motor: Right : Upper extremity   5/5    Left:     Upper extremity   5/5  Lower extremity   5/5     Lower extremity   5/5 Tone and bulk:normal tone throughout; no atrophy noted Sensory: Pinprick and light touch intact throughout, bilaterally Deep Tendon Reflexes: 2+ and symmetric throughout Plantars: Right: downgoing   Left: downgoing Cerebellar: normal finger-to-nose, normal rapid alternating movements and normal heel-to-shin test Gait: normal gait and station    Laboratory Studies:  Basic Metabolic Panel: No results for input(s): NA, K, CL, CO2, GLUCOSE, BUN, CREATININE, CALCIUM, MG, PHOS in the last  168 hours.  Liver Function Tests: No results for input(s): AST, ALT, ALKPHOS, BILITOT, PROT, ALBUMIN in the last 168 hours. No results for input(s): LIPASE, AMYLASE in the last 168 hours. No results for input(s): AMMONIA in the last 168 hours.  CBC:  Recent Labs Lab 05/04/16 1351  WBC 6.8  NEUTROABS 4.3  HGB 15.3  HCT 44.6  MCV 97.4  PLT 148*    Cardiac Enzymes: No results for input(s): CKTOTAL, CKMB, CKMBINDEX, TROPONINI in the last 168 hours.  BNP: Invalid input(s): POCBNP  CBG:  Recent Labs Lab 05/04/16 1400  GLUCAP 110*    Microbiology: No results found for this or any previous visit.  Coagulation Studies: No results for input(s): LABPROT, INR in the last 72 hours.  Urinalysis: No results for input(s): COLORURINE, LABSPEC, PHURINE, GLUCOSEU, HGBUR, BILIRUBINUR, KETONESUR, PROTEINUR, UROBILINOGEN, NITRITE, LEUKOCYTESUR in the last 168 hours.  Invalid input(s): APPERANCEUR  Lipid Panel: No results found for: CHOL, TRIG, HDL, CHOLHDL, VLDL, LDLCALC  HgbA1C: No results found for: HGBA1C  Urine Drug Screen:  No results found for: LABOPIA, COCAINSCRNUR, LABBENZ, AMPHETMU, THCU, LABBARB  Alcohol Level: No results for input(s): ETH in the last 168 hours.  Other results: EKG: sinus rhythm at 95 bpm.  Imaging: Ct Head Code Stroke W/o Cm  Result Date: 05/04/2016 CLINICAL DATA:  Code stroke. Left-sided facial droop beginning 7.5 hours ago. EXAM: CT HEAD WITHOUT CONTRAST TECHNIQUE: Contiguous axial images were obtained from the base of the skull through the vertex without intravenous contrast. COMPARISON:  None. FINDINGS: Brain: No  acute infarct, hemorrhage, or mass lesion is present. Basal ganglia are intact. Insular ribbon is normal. No focal cortical defect is present. Mild atrophy is within normal limits for age. The ventricles are of normal size. No significant extra-axial fluid collection is present. The brainstem and cerebellum are normal. Vascular: No  significant vascular calcifications are present. Skull: The calvarium is intact. Sinuses/Orbits: The paranasal sinuses and mastoid air cells are clear. Other: ASPECTS ([place] Stroke Program Early CT Score) - Ganglionic level infarction (caudate, lentiform nuclei, internal capsule, insula, M1-M3 cortex): 7/7 - Supraganglionic infarction (M4-M6 cortex): 3/3 Total score (0-10 with 10 being normal): 10/10 IMPRESSION: 1. Normal CT of the brain. 2. ASPECTS is 10/10 These results were called by telephone at the time of interpretation on 05/04/2016 at 1:54 pm to Dr. Doy Mince , who verbally acknowledged these results. Electronically Signed   By: San Morelle M.D.   On: 05/04/2016 13:55    Assessment: 55 y.o.HIV positive male with a history of HTN who presents with left facial droop and left sided numbness.  Symptoms have persisted.  Head CT personally reviewed and shows no acute changes.  Patient on ASA at home.  With risk factors concern is for acute infarct.  Patient outside time window for tPA.  Further work up recommended.    Stroke Risk Factors - hypertension and smoking  Plan: 1. HgbA1c, fasting lipid panel 2. MRI, MRA  of the brain without contrast 3. PT consult, OT consult, Speech consult 4. Echocardiogram 5. Carotid dopplers 6. Prophylactic therapy-Antiplatelet med: Aspirin - dose 325mg  daily 7. NPO until RN stroke swallow screen 8. Telemetry monitoring 9. Frequent neuro checks  Case discussed with Dr. Unk Lightning, MD Neurology [phone] 05/04/2016, 2:16 PM

## 2016-05-04 NOTE — ED Notes (Signed)
Transported to CT with RN  

## 2016-05-05 ENCOUNTER — Observation Stay: Admit: 2016-05-05 | Payer: Self-pay

## 2016-05-05 ENCOUNTER — Observation Stay: Payer: Self-pay

## 2016-05-05 LAB — BASIC METABOLIC PANEL
ANION GAP: 7 (ref 5–15)
BUN: 17 mg/dL (ref 6–20)
CHLORIDE: 100 mmol/L — AB (ref 101–111)
CO2: 33 mmol/L — ABNORMAL HIGH (ref 22–32)
Calcium: 9.7 mg/dL (ref 8.9–10.3)
Creatinine, Ser: 1.21 mg/dL (ref 0.61–1.24)
GFR calc Af Amer: >60 mL/min (ref >60)
Glucose, Bld: 90 mg/dL (ref 65–99)
POTASSIUM: 4.4 mmol/L (ref 3.5–5.1)
SODIUM: 140 mmol/L (ref 135–145)

## 2016-05-05 LAB — CBC
HEMATOCRIT: 45.2 % (ref 40.0–52.0)
HEMOGLOBIN: 15.3 g/dL (ref 13.0–18.0)
MCH: 33.1 pg (ref 26.0–34.0)
MCHC: 33.9 g/dL (ref 32.0–36.0)
MCV: 97.7 fL (ref 80.0–100.0)
Platelets: 135 10*3/uL — ABNORMAL LOW (ref 150–440)
RBC: 4.62 MIL/uL (ref 4.40–5.90)
RDW: 14.7 % — AB (ref 11.5–14.5)
WBC: 4.8 10*3/uL (ref 3.8–10.6)

## 2016-05-05 LAB — HEMOGLOBIN A1C
Hgb A1c MFr Bld: 5.2 % (ref 4.8–5.6)
Mean Plasma Glucose: 103 mg/dL

## 2016-05-05 MED ORDER — NICOTINE 21 MG/24HR TD PT24
21.0000 mg | MEDICATED_PATCH | Freq: Every day | TRANSDERMAL | Status: DC
  Administered 2016-05-05: 21 mg via TRANSDERMAL
  Filled 2016-05-05: qty 1

## 2016-05-05 MED ORDER — PREDNISONE 10 MG PO TABS: ORAL_TABLET | ORAL | 0 refills | Status: DC

## 2016-05-05 MED ORDER — CLOPIDOGREL BISULFATE 75 MG PO TABS: 75.0000 mg | ORAL_TABLET | Freq: Every day | ORAL | 1 refills | Status: DC

## 2016-05-05 NOTE — Evaluation (Signed)
Physical Therapy Evaluation Patient Details Name: Daryl Shelton MRN: [id number] DOB: March 30, 1961 Today's Date: 05/05/2016   History of Present Illness  Pt is a 55 y/o male presenting with L sided UE numbness and L facial droop. Pt admitted for TIA/CVA workup. CT and MRI from 9/27 negative for any acute abnormalities related to symptoms and [place] B carotids indicates less than 50% stenosis. PMH of HIV, HTN, anxiety, depression, smoker, and ETOH abuse.   Clinical Impression  Pt lives in a mobile home alone with 5 steps (2 rails within reach) to enter. Pt was independent with all mobility and ADL's at baseline. At this time pt is independent with bed mobility and CGA for transfers/ambulation for safety only (no LOB or vc's required). Pt able to tolerate challenged balance during ambulation without LOB or concerns. Pt continues to demonstrate L sided facial droop with smiling; L tongue deviation when protruded; and numbness at the corner of the mouth (pt reports equal facial sensation B, including lateral cheeks otherwise). Pt demonstrated slight decreased coordination with the L UE and slightly decreased L grip strength (pt reports issues with these at baseline). Pt does not require any further PT services and is safe to return home.      Follow Up Recommendations No PT follow up    Equipment Recommendations  None recommended by PT    Recommendations for Other Services       Precautions / Restrictions Restrictions Weight Bearing Restrictions: No      Mobility  Bed Mobility Overal bed mobility: Independent             General bed mobility comments: Pt able to quickly and safely transfer to EOB without any reports of lightheadedness/dizziness  Transfers Overall transfer level: Needs assistance Equipment used: None Transfers: Sit to/from Stand Sit to Stand: Min guard         General transfer comment: min guard for safety only, no LOB or other safety concerns  noted  Ambulation/Gait Ambulation/Gait assistance: Min guard Ambulation Distance (Feet): 230 Feet Assistive device: None Gait Pattern/deviations: WFL(Within Functional Limits)   Gait velocity interpretation: at or above normal speed for age/gender General Gait Details: No significant gait deviations noted at this time  Stairs            Wheelchair Mobility    Modified Rankin (Stroke Patients Only)       Balance Overall balance assessment: Independent (Pt able to sit EOB unsupported; pt able to complete horizontal and vertical head turns along with speed changes in ambulation without LOB or need to slow/stop)                                           Pertinent Vitals/Pain Pain Assessment: No/denies pain  HR at 86-91 bpm at rest and 111 bpm with activity. 02 97% or greater on room air.    Home Living Family/patient expects to be discharged to:: Private residence Living Arrangements: Alone   Type of Home: Mobile home Home Access: Stairs to enter Entrance Stairs-Rails: Can reach both Entrance Stairs-Number of Steps: 5   Home Equipment: None      Prior Function Level of Independence: Independent               Hand Dominance   Dominant Hand: Right    Extremity/Trunk Assessment   Upper Extremity Assessment: Overall WFL for tasks assessed (B shoulder  flexion, R elbow extension, and B elbow flexion 5/5; L elbow extension 4/5; Good proprioception B; Sensation intact B; L coordination (thumb to finger; chin to finger) and gross grip strength L slightly decreased (pt reports this at baseline))           Lower Extremity Assessment: Overall WFL for tasks assessed (B hip flexion, B knee flexion/extension, and B DF 5/5; Good proprioception B; sensation intact B; good coordination B (heel to shin) in seated; no DF clonus B)         Communication   Communication: No difficulties  Cognition Arousal/Alertness: Awake/alert Behavior During  Therapy: WFL for tasks assessed/performed Overall Cognitive Status: Within Functional Limits for tasks assessed                      General Comments General comments (skin integrity, edema, etc.): Pt agreeable to PT session.     Exercises     Assessment/Plan    PT Assessment Patent does not need any further PT services  PT Problem List            PT Treatment Interventions      PT Goals (Current goals can be found in the Care Plan section)  Acute Rehab PT Goals Patient Stated Goal: To go home. PT Goal Formulation: With patient Time For Goal Achievement: 05/19/16 Potential to Achieve Goals: Good    Frequency     Barriers to discharge        Co-evaluation               End of Session Equipment Utilized During Treatment: Gait belt Activity Tolerance: Patient tolerated treatment well Patient left: in bed;with call bell/phone within reach;with family/visitor present (No bed alarm set; fall risk score of 3 and RN notified) Nurse Communication: Mobility status         Time: [id number] PT Time Calculation (min) (ACUTE ONLY): 24 min   Charges:         PT G Codes:        Chaney Maclaren, SPT 05/05/2016, 10:38 AM

## 2016-05-05 NOTE — Discharge Summary (Signed)
[facility] NAME: Daryl Shelton    MR#:  [id number]  DATE OF BIRTH:  18-Sep-1960  DATE OF ADMISSION:  05/04/2016 ADMITTING PHYSICIAN: Vaughan Basta, MD  DATE OF DISCHARGE: 05/05/2016 11:30 AM  PRIMARY CARE PHYSICIAN: Foye Spurling, MD    ADMISSION DIAGNOSIS:  CVA (cerebral infarction) [I63.9] Left sided numbness [R20.0] Transient cerebral ischemia, unspecified transient cerebral ischemia type [G45.9]  DISCHARGE DIAGNOSIS:  Principal Problem:   TIA (transient ischemic attack)   SECONDARY DIAGNOSIS:   Past Medical History:  Diagnosis Date  . Anxiety   . Depression   . HIV (human immunodeficiency virus infection) ([place])   . Hypertension     HOSPITAL COURSE:   55 year old male with past medical history of anxiety, depression, history of HIV, hypertension who presented to the hospital due to left sided facial and arm numbness.  1. TIA-this was a suspected diagnosis given patient's transient symptoms of left facial and arm numbness. -Patient underwent an extensive evaluation including CT head, MRI brain and carotid duplex which were all essentially within normal limits. -Patient was seen by neurology who recommended switching him from aspirin to Plavix which was done prior to discharge. Patient still having some left facial numbness and weakness but the left arm symptoms have not resolved.  2. Bell's palsy-this was the cause of patient's left facial weakness and numbness. Patient only had ipsilateral symptoms without any further upper or lower extremity symptoms. -Patient was empirically discharge. Low-dose prednisone taper.  3. Essential hypertension-patient will continue his HCTZ, lisinopril.  4. GERD-patient will resume his omeprazole.  5. Hx of HIV-patient will resume his HAART.   6. Hyperlipidemia - cont. Pravachol.   DISCHARGE CONDITIONS:   Stable.   CONSULTS OBTAINED:  Treatment Team:  Catarina Hartshorn, MD  DRUG ALLERGIES:  No Known Allergies  DISCHARGE MEDICATIONS:     Medication List    STOP taking these medications   aspirin EC 81 MG tablet     TAKE these medications   clopidogrel 75 MG tablet Commonly known as:  PLAVIX Take 1 tablet (75 mg total) by mouth daily.   fluconazole 150 MG tablet Commonly known as:  DIFLUCAN Take 150 mg by mouth once a week.   gabapentin 600 MG tablet Commonly known as:  NEURONTIN Take 600 mg by mouth 3 (three) times daily.   GENVOYA 150-150-200-10 MG Tabs tablet Generic drug:  elvitegravir-cobicistat-emtricitabine-tenofovir Take 1 tablet by mouth daily with breakfast.   hydrochlorothiazide 25 MG tablet Commonly known as:  HYDRODIURIL Take 25 mg by mouth daily.   lisinopril 40 MG tablet Commonly known as:  PRINIVIL,ZESTRIL Take 40 mg by mouth daily.   mirtazapine 30 MG tablet Commonly known as:  REMERON Take 30 mg by mouth at bedtime.   naltrexone 50 MG tablet Commonly known as:  DEPADE Take 50 mg by mouth daily.   omeprazole 20 MG capsule Commonly known as:  PRILOSEC Take 20 mg by mouth daily.   pravastatin 40 MG tablet Commonly known as:  PRAVACHOL Take 40 mg by mouth daily.   predniSONE 10 MG tablet Commonly known as:  DELTASONE Label  & dispense according to the schedule below. 4 Pills PO for 1 day, 3 Pills PO for 1 day, 2 Pills PO for 1 day, 1 Pill PO for 1 days then STOP.   sertraline 100 MG tablet Commonly known as:  ZOLOFT Take 200 mg by mouth daily.   zinc gluconate 50 MG tablet Take  50 mg by mouth daily.         DISCHARGE INSTRUCTIONS:   DIET:  Cardiac diet  DISCHARGE CONDITION:  Stable  ACTIVITY:  Activity as tolerated  OXYGEN:  Home Oxygen: No.   Oxygen Delivery: room air  DISCHARGE LOCATION:  home   If you experience worsening of your admission symptoms, develop shortness of breath, life threatening emergency, suicidal or homicidal thoughts you must seek medical attention  immediately by calling 911 or calling your MD immediately  if symptoms less severe.  You Must read complete instructions/literature along with all the possible adverse reactions/side effects for all the Medicines you take and that have been prescribed to you. Take any new Medicines after you have completely understood and accpet all the possible adverse reactions/side effects.   Please note  You were cared for by a hospitalist during your hospital stay. If you have any questions about your discharge medications or the care you received while you were in the hospital after you are discharged, you can call the unit and asked to speak with the hospitalist on call if the hospitalist that took care of you is not available. Once you are discharged, your primary care physician will handle any further medical issues. Please note that NO REFILLS for any discharge medications will be authorized once you are discharged, as it is imperative that you return to your primary care physician (or establish a relationship with a primary care physician if you do not have one) for your aftercare needs so that they can reassess your need for medications and monitor your lab values.     Today   Still having some left facial weakness, numbness.  No headache and left arm numbness now resolved.    VITAL SIGNS:  Blood pressure 140/82, pulse 83, temperature 98.1 F (36.7 C), temperature source Oral, resp. rate 16, height 5\' 10"  (1.778 m), weight 102.4 kg (225 lb 12.8 oz), SpO2 96 %.  I/O:   Intake/Output Summary (Last 24 hours) at 05/05/16 1613 Last data filed at 05/05/16 1004  Gross per 24 hour  Intake              120 ml  Output                0 ml  Net              120 ml    PHYSICAL EXAMINATION:  GENERAL:  55 y.o.-year-old patient lying in the bed with no acute distress.  EYES: Pupils equal, round, reactive to light and accommodation. No scleral icterus. Extraocular muscles intact.  HEENT: Head atraumatic,  normocephalic. Oropharynx and nasopharynx clear.  NECK:  Supple, no jugular venous distention. No thyroid enlargement, no tenderness.  LUNGS: Normal breath sounds bilaterally, no wheezing, rales,rhonchi. No use of accessory muscles of respiration.  CARDIOVASCULAR: S1, S2 normal. No murmurs, rubs, or gallops.  ABDOMEN: Soft, non-tender, non-distended. Bowel sounds present. No organomegaly or mass.  EXTREMITIES: No pedal edema, cyanosis, or clubbing.  NEUROLOGIC: Cranial nerves II through XII are intact. No focal motor or sensory defecits b/l. Left sided facial weakness.   PSYCHIATRIC: The patient is alert and oriented x 3. Good affect.  SKIN: No obvious rash, lesion, or ulcer.   DATA REVIEW:   CBC  Recent Labs Lab 05/05/16 0450  WBC 4.8  HGB 15.3  HCT 45.2  PLT 135*    Chemistries   Recent Labs Lab 05/04/16 1351 05/05/16 0450  NA 140 140  K 4.4  4.4  CL 102 100*  CO2 28 33*  GLUCOSE 100* 90  BUN 22* 17  CREATININE 1.23 1.21  CALCIUM 9.5 9.7  AST 32  --   ALT 28  --   ALKPHOS 60  --   BILITOT 0.5  --     Cardiac Enzymes  Recent Labs Lab 05/04/16 1351  TROPONINI <0.03    Microbiology Results  No results found for this or any previous visit.  RADIOLOGY:  Mr Brain 62 Contrast  Result Date: 05/04/2016 CLINICAL DATA:  55 year old male with right side facial droop and numbness. Unable to close right eye. Tingling in the left upper extremity. Initial encounter. EXAM: MRI HEAD WITHOUT CONTRAST TECHNIQUE: Multiplanar, multiecho pulse sequences of the brain and surrounding structures were obtained without intravenous contrast. COMPARISON:  Head CT without contrast 1342 hours today. FINDINGS: Brain: No restricted diffusion to suggest acute infarction. No midline shift, mass effect, evidence of mass lesion, ventriculomegaly, extra-axial collection or acute intracranial hemorrhage. Cervicomedullary junction and pituitary are within normal limits. Pearline Cables and white matter signal  is within normal limits for age throughout the brain. No cortical encephalomalacia. No chronic cerebral blood products. Visible internal auditory structures appear normal. Stylomastoid foramina appear normal. See parotid gland findings below. Vascular: Major intracranial vascular flow voids appear normal. Skull and upper cervical spine: Negative. Normal bone marrow signal. Sinuses/Orbits: Visualized paranasal sinuses and mastoids are stable and well pneumatized. Orbits soft tissues appear normal. Negative scalp soft tissues. Other: Normal left parotid gland. Along the posterior margin of the right parotid space there is a 17 by 12 by 24 mm (AP by transverse by CC) soft tissue nodule with heterogeneous T1 and T2 signal. This is circumscribed. The remainder of the right parotid gland appears normal. Visible face soft tissues appear normal. IMPRESSION: 1. No acute intracranial abnormality. Normal for age noncontrast MRI appearance of the brain. 2. Posterior right parotid space small 2.4 cm soft tissue nodule. This appears unrelated to the presentation of right facial weakness, and benign etiology (such as Warthin's or Benign Mixed Tumor) is favored. Recommend outpatient follow-up with ENT. Electronically Signed   By: Genevie Ann M.D.   On: 05/04/2016 15:02   US Carotid Bilateral  Result Date: 05/05/2016 CLINICAL DATA:  Cerebrovascular accident. EXAM: BILATERAL CAROTID DUPLEX ULTRASOUND TECHNIQUE: Pearline Cables scale imaging, color Doppler and duplex ultrasound were performed of bilateral carotid and vertebral arteries in the neck. COMPARISON:  None. FINDINGS: Criteria: Quantification of carotid stenosis is based on velocity parameters that correlate the residual internal carotid diameter with NASCET-based stenosis levels, using the diameter of the distal internal carotid lumen as the denominator for stenosis measurement. The following velocity measurements were obtained: RIGHT ICA:  63/25 cm/sec CCA:  [id number] cm/sec SYSTOLIC  ICA/CCA RATIO:  0.9 DIASTOLIC ICA/CCA RATIO:  1.6 ECA:  84 cm/sec LEFT ICA:  57/19 cm/sec CCA:  [id number] cm/sec SYSTOLIC ICA/CCA RATIO:  0.8 DIASTOLIC ICA/CCA RATIO:  0.8 ECA:  57 cm/sec RIGHT CAROTID ARTERY: Mild eccentric plaque formation is noted in the proximal right internal carotid artery consistent with less than 50% diameter stenosis based on ultrasound and Doppler criteria. RIGHT VERTEBRAL ARTERY:  Antegrade flow is noted. LEFT CAROTID ARTERY: Mild eccentric plaque formation is noted in the left carotid bulb and proximal left internal carotid artery consistent with less than 50% diameter stenosis based on ultrasound and Doppler criteria. LEFT VERTEBRAL ARTERY:  Antegrade flow is noted. IMPRESSION: Mild plaque formation is noted in the proximal portions of both internal carotid arteries  consistent with less than 50% diameter stenosis based on ultrasound and Doppler criteria. Electronically Signed   By: Marijo Conception, M.D.   On: 05/05/2016 08:55   Ct Head Code Stroke W/o Cm  Result Date: 05/04/2016 CLINICAL DATA:  Code stroke. Left-sided facial droop beginning 7.5 hours ago. EXAM: CT HEAD WITHOUT CONTRAST TECHNIQUE: Contiguous axial images were obtained from the base of the skull through the vertex without intravenous contrast. COMPARISON:  None. FINDINGS: Brain: No acute infarct, hemorrhage, or mass lesion is present. Basal ganglia are intact. Insular ribbon is normal. No focal cortical defect is present. Mild atrophy is within normal limits for age. The ventricles are of normal size. No significant extra-axial fluid collection is present. The brainstem and cerebellum are normal. Vascular: No significant vascular calcifications are present. Skull: The calvarium is intact. Sinuses/Orbits: The paranasal sinuses and mastoid air cells are clear. Other: ASPECTS ([place] Stroke Program Early CT Score) - Ganglionic level infarction (caudate, lentiform nuclei, internal capsule, insula, M1-M3 cortex): 7/7 -  Supraganglionic infarction (M4-M6 cortex): 3/3 Total score (0-10 with 10 being normal): 10/10 IMPRESSION: 1. Normal CT of the brain. 2. ASPECTS is 10/10 These results were called by telephone at the time of interpretation on 05/04/2016 at 1:54 pm to Dr. Doy Mince , who verbally acknowledged these results. Electronically Signed   By: San Morelle M.D.   On: 05/04/2016 13:55      Management plans discussed with the patient, family and they are in agreement.  CODE STATUS:  Code Status History    Date Active Date Inactive Code Status Order ID Comments User Context   05/04/2016  4:25 PM 05/05/2016  7:27 AM Full Code [id number]  Vaughan Basta, MD Inpatient      TOTAL TIME TAKING CARE OF THIS PATIENT: 40 minutes.    Henreitta Leber M.D on 05/05/2016 at 4:13 PM  Between 7am to 6pm - Pager - [phone]  After 6pm go to [email or web address] - Proofreader  [facility]  Office  [phone]  CC: Primary care physician; Foye Spurling, MD

## 2016-05-05 NOTE — Progress Notes (Signed)
Discharge instructions along with home medications and follow up gone over with patient. Pt verbalizes that he understood instructions. Two prescriptions given to patient. IV and tele removed. Pt being discharged home on room air, no distress noted. Pt refused wheelchair, ambulated to visitors entrance to private vehicle and drove himself home. Ammie Dalton, RN

## 2016-05-05 NOTE — Progress Notes (Signed)
Subjective: Reports LUE symptoms have resolved but continues to have left facial numbness and left facial droop.    Objective: Current vital signs: BP 140/82 (BP Location: Left Arm)   Pulse 83   Temp 98.1 F (36.7 C) (Oral)   Resp 16   Ht 5\' 10"  (1.778 m)   Wt 102.4 kg (225 lb 12.8 oz)   SpO2 96%   BMI 32.40 kg/m  Vital signs in last 24 hours: Temp:  [97.3 F (36.3 C)-98.1 F (36.7 C)] 98.1 F (36.7 C) (09/28 0927) Pulse Rate:  [73-92] 83 (09/28 0927) Resp:  [16-20] 16 (09/28 0927) BP: (132-196)/(74-106) 140/82 (09/28 0927) SpO2:  [96 %-100 %] 96 % (09/28 0927) Weight:  [99.8 kg (220 lb)-102.4 kg (225 lb 12.8 oz)] 102.4 kg (225 lb 12.8 oz) (09/27 1625)  Intake/Output from previous day: No intake/output data recorded. Intake/Output this shift: Total I/O In: 120 [P.O.:120] Out: -  Nutritional status: Diet Heart Room service appropriate? Yes; Fluid consistency: Thin Diet - low sodium heart healthy  Neurologic Exam: Mental Status: Alert, oriented, thought content appropriate.  Speech fluent without evidence of aphasia.  Able to follow 3 step commands without difficulty. Cranial Nerves: II: Discs flat bilaterally; Visual fields grossly normal, pupils equal, round, reactive to light and accommodation III,IV, VI: ptosis not present, extra-ocular motions intact bilaterally V,VII: left facial droop, facial light touch sensation decreased on the left  VIII: hearing normal bilaterally IX,X: gag reflex present XI: bilateral shoulder shrug XII: midline tongue extension Motor: Right :  Upper extremity   5/5                                      Left:     Upper extremity   5/5             Lower extremity   5/5                                                  Lower extremity   5/5 Tone and bulk:normal tone throughout; no atrophy noted Sensory: Pinprick and light touch intact throughout, bilaterally  Lab Results: Basic Metabolic Panel:  Recent Labs Lab 05/04/16 1351  05/05/16 0450  NA 140 140  K 4.4 4.4  CL 102 100*  CO2 28 33*  GLUCOSE 100* 90  BUN 22* 17  CREATININE 1.23 1.21  CALCIUM 9.5 9.7    Liver Function Tests:  Recent Labs Lab 05/04/16 1351  AST 32  ALT 28  ALKPHOS 60  BILITOT 0.5  PROT 8.3*  ALBUMIN 4.5   No results for input(s): LIPASE, AMYLASE in the last 168 hours. No results for input(s): AMMONIA in the last 168 hours.  CBC:  Recent Labs Lab 05/04/16 1351 05/05/16 0450  WBC 6.8 4.8  NEUTROABS 4.3  --   HGB 15.3 15.3  HCT 44.6 45.2  MCV 97.4 97.7  PLT 148* 135*    Cardiac Enzymes:  Recent Labs Lab 05/04/16 1351  TROPONINI <0.03    Lipid Panel:  Recent Labs Lab 05/04/16 1351  CHOL 215*  TRIG 488*  HDL 72  CHOLHDL 3.0  VLDL UNABLE TO CALCULATE IF TRIGLYCERIDE OVER 400 mg/dL  LDLCALC UNABLE TO CALCULATE IF TRIGLYCERIDE OVER 400 mg/dL    CBG:  Recent  Labs Lab 05/04/16 1400  GLUCAP 110*    Microbiology: No results found for this or any previous visit.  Coagulation Studies:  Recent Labs  05/04/16 1351  LABPROT 11.8  INR 0.87    Imaging: Mr Brain Wo Contrast  Result Date: 05/04/2016 CLINICAL DATA:  55 year old male with right side facial droop and numbness. Unable to close right eye. Tingling in the left upper extremity. Initial encounter. EXAM: MRI HEAD WITHOUT CONTRAST TECHNIQUE: Multiplanar, multiecho pulse sequences of the brain and surrounding structures were obtained without intravenous contrast. COMPARISON:  Head CT without contrast 1342 hours today. FINDINGS: Brain: No restricted diffusion to suggest acute infarction. No midline shift, mass effect, evidence of mass lesion, ventriculomegaly, extra-axial collection or acute intracranial hemorrhage. Cervicomedullary junction and pituitary are within normal limits. Pearline Cables and white matter signal is within normal limits for age throughout the brain. No cortical encephalomalacia. No chronic cerebral blood products. Visible internal  auditory structures appear normal. Stylomastoid foramina appear normal. See parotid gland findings below. Vascular: Major intracranial vascular flow voids appear normal. Skull and upper cervical spine: Negative. Normal bone marrow signal. Sinuses/Orbits: Visualized paranasal sinuses and mastoids are stable and well pneumatized. Orbits soft tissues appear normal. Negative scalp soft tissues. Other: Normal left parotid gland. Along the posterior margin of the right parotid space there is a 17 by 12 by 24 mm (AP by transverse by CC) soft tissue nodule with heterogeneous T1 and T2 signal. This is circumscribed. The remainder of the right parotid gland appears normal. Visible face soft tissues appear normal. IMPRESSION: 1. No acute intracranial abnormality. Normal for age noncontrast MRI appearance of the brain. 2. Posterior right parotid space small 2.4 cm soft tissue nodule. This appears unrelated to the presentation of right facial weakness, and benign etiology (such as Warthin's or Benign Mixed Tumor) is favored. Recommend outpatient follow-up with ENT. Electronically Signed   By: Genevie Ann M.D.   On: 05/04/2016 15:02   US Carotid Bilateral  Result Date: 05/05/2016 CLINICAL DATA:  Cerebrovascular accident. EXAM: BILATERAL CAROTID DUPLEX ULTRASOUND TECHNIQUE: Pearline Cables scale imaging, color Doppler and duplex ultrasound were performed of bilateral carotid and vertebral arteries in the neck. COMPARISON:  None. FINDINGS: Criteria: Quantification of carotid stenosis is based on velocity parameters that correlate the residual internal carotid diameter with NASCET-based stenosis levels, using the diameter of the distal internal carotid lumen as the denominator for stenosis measurement. The following velocity measurements were obtained: RIGHT ICA:  63/25 cm/sec CCA:  [id number] cm/sec SYSTOLIC ICA/CCA RATIO:  0.9 DIASTOLIC ICA/CCA RATIO:  1.6 ECA:  84 cm/sec LEFT ICA:  57/19 cm/sec CCA:  [id number] cm/sec SYSTOLIC ICA/CCA RATIO:  0.8  DIASTOLIC ICA/CCA RATIO:  0.8 ECA:  57 cm/sec RIGHT CAROTID ARTERY: Mild eccentric plaque formation is noted in the proximal right internal carotid artery consistent with less than 50% diameter stenosis based on ultrasound and Doppler criteria. RIGHT VERTEBRAL ARTERY:  Antegrade flow is noted. LEFT CAROTID ARTERY: Mild eccentric plaque formation is noted in the left carotid bulb and proximal left internal carotid artery consistent with less than 50% diameter stenosis based on ultrasound and Doppler criteria. LEFT VERTEBRAL ARTERY:  Antegrade flow is noted. IMPRESSION: Mild plaque formation is noted in the proximal portions of both internal carotid arteries consistent with less than 50% diameter stenosis based on ultrasound and Doppler criteria. Electronically Signed   By: Marijo Conception, M.D.   On: 05/05/2016 08:55   Ct Head Code Stroke W/o Cm  Result Date: 05/04/2016 CLINICAL DATA:  Code stroke. Left-sided facial droop beginning 7.5 hours ago. EXAM: CT HEAD WITHOUT CONTRAST TECHNIQUE: Contiguous axial images were obtained from the base of the skull through the vertex without intravenous contrast. COMPARISON:  None. FINDINGS: Brain: No acute infarct, hemorrhage, or mass lesion is present. Basal ganglia are intact. Insular ribbon is normal. No focal cortical defect is present. Mild atrophy is within normal limits for age. The ventricles are of normal size. No significant extra-axial fluid collection is present. The brainstem and cerebellum are normal. Vascular: No significant vascular calcifications are present. Skull: The calvarium is intact. Sinuses/Orbits: The paranasal sinuses and mastoid air cells are clear. Other: ASPECTS ([place] Stroke Program Early CT Score) - Ganglionic level infarction (caudate, lentiform nuclei, internal capsule, insula, M1-M3 cortex): 7/7 - Supraganglionic infarction (M4-M6 cortex): 3/3 Total score (0-10 with 10 being normal): 10/10 IMPRESSION: 1. Normal CT of the brain. 2. ASPECTS  is 10/10 These results were called by telephone at the time of interpretation on 05/04/2016 at 1:54 pm to Dr. Doy Mince , who verbally acknowledged these results. Electronically Signed   By: San Morelle M.D.   On: 05/04/2016 13:55    Medications:  I have reviewed the patient's current medications. Scheduled: . aspirin EC  81 mg Oral Daily  . elvitegravir-cobicistat-emtricitabine-tenofovir  1 tablet Oral Q breakfast  . [START ON 05/11/2016] fluconazole  150 mg Oral Weekly  . folic acid  1 mg Oral Daily  . gabapentin  600 mg Oral TID  . heparin  5,000 Units Subcutaneous Q8H  . hydrochlorothiazide  25 mg Oral Daily  . lisinopril  40 mg Oral Daily  . LORazepam  0-4 mg Intravenous Q6H   Followed by  . [START ON 05/06/2016] LORazepam  0-4 mg Intravenous Q12H  . mirtazapine  30 mg Oral QHS  . multivitamin with minerals  1 tablet Oral Daily  . naltrexone  50 mg Oral Daily  . nicotine  21 mg Transdermal Daily  . pantoprazole  40 mg Oral Daily  . pravastatin  40 mg Oral Daily  . sertraline  200 mg Oral Daily  . sodium chloride flush  3 mL Intravenous Q12H  . thiamine  100 mg Oral Daily   Or  . thiamine  100 mg Intravenous Daily  . zinc sulfate  220 mg Oral Daily    Assessment/Plan: Although facial droop has peripheral characteristics, LUE symptoms are not characteristic of a peripheral seventh nerve palsy.  Concern is for a small infarct not noted on MRI.  MRI of the brain personally reviewed and shows no acute changes.  Carotid dopplers show no evidence of hemodynamically significant stenosis.  Echocardiogram pending.  A1c 5.2, LDL unable to determine secondary to high triglycerides.  Recommendations: 1.  Change ASA to Plavix 75mg  daily 2.  Echocardiogram to be followed up  3.  Aggressive lipid management   LOS: 0 days   Alexis Goodell, MD Neurology [phone] 05/05/2016  1:42 PM

## 2016-11-09 DIAGNOSIS — Z720 Tobacco use: Secondary | ICD-10-CM | POA: Insufficient documentation

## 2017-02-14 MED ORDER — OMEPRAZOLE 20 MG CAPSULE,DELAYED RELEASE
ORAL_CAPSULE | Freq: Every day | ORAL | 3 refills | 0 days | Status: CP
Start: 2017-02-14 — End: 2017-06-02

## 2017-04-12 MED ORDER — SERTRALINE 100 MG TABLET
ORAL_TABLET | Freq: Every day | ORAL | 3 refills | 0.00000 days | Status: CP
Start: 2017-04-12 — End: 2017-07-25

## 2017-04-12 MED ORDER — MIRTAZAPINE 30 MG TABLET
ORAL_TABLET | Freq: Every evening | ORAL | 3 refills | 0 days | Status: CP
Start: 2017-04-12 — End: 2017-07-25

## 2017-04-12 MED ORDER — GABAPENTIN 600 MG TABLET
ORAL_TABLET | Freq: Three times a day (TID) | ORAL | 3 refills | 0.00000 days | Status: CP
Start: 2017-04-12 — End: 2017-07-25

## 2017-05-10 ENCOUNTER — Ambulatory Visit
Admission: RE | Admit: 2017-05-10 | Discharge: 2017-05-10 | Disposition: A | Discharge status: Home / Self Care | Attending: Internal Medicine | Admitting: Internal Medicine

## 2017-05-10 DIAGNOSIS — Z9229 Personal history of other drug therapy: Secondary | ICD-10-CM

## 2017-05-10 DIAGNOSIS — Z113 Encounter for screening for infections with a predominantly sexual mode of transmission: Secondary | ICD-10-CM

## 2017-05-10 DIAGNOSIS — N189 Chronic kidney disease, unspecified: Secondary | ICD-10-CM

## 2017-05-10 DIAGNOSIS — B20 Human immunodeficiency virus [HIV] disease: Secondary | ICD-10-CM

## 2017-05-10 DIAGNOSIS — R85619 Unspecified abnormal cytological findings in specimens from anus: Secondary | ICD-10-CM

## 2017-05-10 DIAGNOSIS — I10 Essential (primary) hypertension: Secondary | ICD-10-CM

## 2017-05-10 DIAGNOSIS — Z23 Encounter for immunization: Principal | ICD-10-CM

## 2017-05-10 DIAGNOSIS — Z8042 Family history of malignant neoplasm of prostate: Secondary | ICD-10-CM

## 2017-06-02 MED ORDER — OMEPRAZOLE 20 MG CAPSULE,DELAYED RELEASE
ORAL_CAPSULE | Freq: Every day | ORAL | 3 refills | 0 days | Status: CP
Start: 2017-06-02 — End: 2017-10-04

## 2017-06-28 MED ORDER — LISINOPRIL 40 MG TABLET
ORAL_TABLET | Freq: Every day | ORAL | 11 refills | 0 days | Status: CP
Start: 2017-06-28 — End: 2017-07-06

## 2017-06-28 MED ORDER — HYDROCHLOROTHIAZIDE 25 MG TABLET
ORAL_TABLET | Freq: Every day | ORAL | 11 refills | 0.00000 days | Status: CP
Start: 2017-06-28 — End: 2018-05-15

## 2017-07-06 MED ORDER — LISINOPRIL 40 MG TABLET
ORAL_TABLET | Freq: Every day | ORAL | 11 refills | 0 days | Status: CP
Start: 2017-07-06 — End: 2018-05-15

## 2017-07-07 ENCOUNTER — Ambulatory Visit: Admission: RE | Admit: 2017-07-07 | Discharge: 2017-07-07 | Disposition: A | Discharge status: Home / Self Care

## 2017-07-07 ENCOUNTER — Ambulatory Visit
Admission: RE | Admit: 2017-07-07 | Discharge: 2017-07-07 | Disposition: A | Discharge status: Home / Self Care | Attending: Anesthesiology | Admitting: Anesthesiology

## 2017-07-07 DIAGNOSIS — Z1211 Encounter for screening for malignant neoplasm of colon: Principal | ICD-10-CM

## 2017-07-26 MED ORDER — SERTRALINE 100 MG TABLET
ORAL_TABLET | 0 refills | 0 days | Status: CP
Start: 2017-07-26 — End: 2017-08-30

## 2017-07-26 MED ORDER — MIRTAZAPINE 30 MG TABLET
ORAL_TABLET | 0 refills | 0 days | Status: CP
Start: 2017-07-26 — End: 2017-08-30

## 2017-07-26 MED ORDER — GABAPENTIN 600 MG TABLET
ORAL_TABLET | 0 refills | 0 days | Status: CP
Start: 2017-07-26 — End: 2017-08-30

## 2017-08-30 MED ORDER — SERTRALINE 100 MG TABLET
ORAL_TABLET | 11 refills | 0 days | Status: CP
Start: 2017-08-30 — End: 2018-07-11

## 2017-08-30 MED ORDER — MIRTAZAPINE 30 MG TABLET
ORAL_TABLET | 11 refills | 0 days | Status: CP
Start: 2017-08-30 — End: 2018-07-11

## 2017-08-30 MED ORDER — GABAPENTIN 600 MG TABLET
ORAL_TABLET | 11 refills | 0 days | Status: CP
Start: 2017-08-30 — End: 2018-07-11

## 2017-09-13 ENCOUNTER — Ambulatory Visit: Admit: 2017-09-13 | Discharge: 2017-09-14 | Attending: Internal Medicine | Primary: Internal Medicine

## 2017-09-13 DIAGNOSIS — F1010 Alcohol abuse, uncomplicated: Secondary | ICD-10-CM

## 2017-09-13 DIAGNOSIS — Z720 Tobacco use: Secondary | ICD-10-CM

## 2017-09-13 DIAGNOSIS — B20 Human immunodeficiency virus [HIV] disease: Principal | ICD-10-CM

## 2017-09-13 MED ORDER — BICTEGRAVIR 50 MG-EMTRICITABINE 200 MG-TENOFOVIR ALAFENAM 25 MG TABLET
ORAL_TABLET | Freq: Every day | ORAL | 11 refills | 0.00000 days | Status: CP
Start: 2017-09-13 — End: 2018-07-20

## 2017-09-13 MED ORDER — NICOTINE 14 MG/24 HR DAILY TRANSDERMAL PATCH
MEDICATED_PATCH | TRANSDERMAL | 0 refills | 0.00000 days
Start: 2017-09-13 — End: 2018-05-07

## 2017-09-13 MED ORDER — NICOTINE 7 MG/24 HR DAILY TRANSDERMAL PATCH
MEDICATED_PATCH | TRANSDERMAL | 0 refills | 0.00000 days
Start: 2017-09-13 — End: 2018-05-07

## 2017-10-04 MED ORDER — OMEPRAZOLE 20 MG CAPSULE,DELAYED RELEASE
ORAL_CAPSULE | Freq: Every day | ORAL | 3 refills | 0.00000 days | Status: CP
Start: 2017-10-04 — End: 2018-01-26

## 2017-10-04 MED ORDER — PRAVASTATIN 40 MG TABLET
ORAL_TABLET | Freq: Every day | ORAL | 11 refills | 0 days | Status: CP
Start: 2017-10-04 — End: 2018-08-17

## 2018-01-27 MED ORDER — OMEPRAZOLE 20 MG CAPSULE,DELAYED RELEASE
ORAL_CAPSULE | Freq: Every day | ORAL | 3 refills | 0 days | Status: CP
Start: 2018-01-27 — End: 2018-05-15

## 2018-05-07 ENCOUNTER — Ambulatory Visit: Admit: 2018-05-07 | Discharge: 2018-05-08

## 2018-05-07 DIAGNOSIS — I10 Essential (primary) hypertension: Secondary | ICD-10-CM

## 2018-05-07 DIAGNOSIS — E669 Obesity, unspecified: Secondary | ICD-10-CM

## 2018-05-07 DIAGNOSIS — F1010 Alcohol abuse, uncomplicated: Secondary | ICD-10-CM

## 2018-05-07 DIAGNOSIS — B20 Human immunodeficiency virus [HIV] disease: Principal | ICD-10-CM

## 2018-05-07 DIAGNOSIS — Z720 Tobacco use: Secondary | ICD-10-CM

## 2018-05-07 DIAGNOSIS — K029 Dental caries, unspecified: Secondary | ICD-10-CM

## 2018-05-07 DIAGNOSIS — Z0184 Encounter for antibody response examination: Secondary | ICD-10-CM

## 2018-05-07 DIAGNOSIS — N189 Chronic kidney disease, unspecified: Secondary | ICD-10-CM

## 2018-05-07 MED ORDER — NICOTINE (POLACRILEX) 4 MG BUCCAL LOZENGE
BUCCAL | 0 refills | 0.00000 days | Status: CP | PRN
Start: 2018-05-07 — End: 2018-06-06

## 2018-05-07 MED ORDER — NICOTINE 21 MG/24 HR DAILY TRANSDERMAL PATCH
MEDICATED_PATCH | TRANSDERMAL | 2 refills | 0 days | Status: CP
Start: 2018-05-07 — End: 2018-08-17

## 2018-05-15 MED ORDER — HYDROCHLOROTHIAZIDE 25 MG TABLET
ORAL_TABLET | Freq: Every day | ORAL | 11 refills | 0 days | Status: CP
Start: 2018-05-15 — End: 2019-02-15

## 2018-05-15 MED ORDER — OMEPRAZOLE 20 MG CAPSULE,DELAYED RELEASE
ORAL_CAPSULE | Freq: Every day | ORAL | 3 refills | 0 days | Status: CP
Start: 2018-05-15 — End: 2018-08-17

## 2018-05-15 MED ORDER — LISINOPRIL 40 MG TABLET
ORAL_TABLET | Freq: Every day | ORAL | 11 refills | 0.00000 days | Status: CP
Start: 2018-05-15 — End: 2019-04-02

## 2018-05-31 ENCOUNTER — Ambulatory Visit: Admit: 2018-05-31 | Discharge: 2018-05-31

## 2018-05-31 DIAGNOSIS — Z720 Tobacco use: Principal | ICD-10-CM

## 2018-07-11 MED ORDER — SERTRALINE 100 MG TABLET
ORAL_TABLET | 0 refills | 0 days | Status: CP
Start: 2018-07-11 — End: 2018-08-17

## 2018-07-11 MED ORDER — MIRTAZAPINE 30 MG TABLET
ORAL_TABLET | 0 refills | 0 days | Status: CP
Start: 2018-07-11 — End: 2018-07-20

## 2018-07-11 MED ORDER — GABAPENTIN 600 MG TABLET
ORAL_TABLET | 0 refills | 0 days | Status: CP
Start: 2018-07-11 — End: 2018-07-20

## 2018-07-20 MED ORDER — BIKTARVY 50 MG-200 MG-25 MG TABLET
ORAL_TABLET | 0 refills | 0 days | Status: CP
Start: 2018-07-20 — End: 2018-08-17

## 2018-07-20 MED ORDER — GABAPENTIN 600 MG TABLET
ORAL_TABLET | 0 refills | 0 days | Status: CP
Start: 2018-07-20 — End: 2018-08-17

## 2018-07-20 MED ORDER — MIRTAZAPINE 30 MG TABLET
ORAL_TABLET | 0 refills | 0 days | Status: CP
Start: 2018-07-20 — End: 2018-08-17

## 2018-08-17 MED ORDER — PRAVASTATIN 40 MG TABLET
ORAL_TABLET | 11 refills | 0 days | Status: CP
Start: 2018-08-17 — End: 2018-10-09

## 2018-08-17 MED ORDER — OMEPRAZOLE 20 MG CAPSULE,DELAYED RELEASE
ORAL_CAPSULE | 3 refills | 0 days | Status: CP
Start: 2018-08-17 — End: 2018-12-13

## 2018-08-20 MED ORDER — MIRTAZAPINE 30 MG TABLET
ORAL_TABLET | Freq: Every evening | ORAL | 0 refills | 0 days | Status: CP
Start: 2018-08-20 — End: 2018-08-23

## 2018-08-20 MED ORDER — GABAPENTIN 600 MG TABLET
ORAL_TABLET | Freq: Three times a day (TID) | ORAL | 0 refills | 0 days | Status: CP
Start: 2018-08-20 — End: 2018-08-23

## 2018-08-20 MED ORDER — NICOTINE 21 MG/24 HR DAILY TRANSDERMAL PATCH
MEDICATED_PATCH | TRANSDERMAL | 2 refills | 0 days | Status: CP
Start: 2018-08-20 — End: 2018-08-23

## 2018-08-20 MED ORDER — BICTEGRAVIR 50 MG-EMTRICITABINE 200 MG-TENOFOVIR ALAFENAM 25 MG TABLET
ORAL_TABLET | Freq: Every day | ORAL | 0 refills | 0 days | Status: CP
Start: 2018-08-20 — End: 2018-08-23

## 2018-08-20 NOTE — Unmapped (Signed)
Refills provided to patient. He has been on this medication regimen for some time and tolerating it well. Will need follow-up within the next month.

## 2018-08-22 MED ORDER — SERTRALINE 100 MG TABLET
ORAL_TABLET | Freq: Every day | ORAL | 0 refills | 0 days | Status: CP
Start: 2018-08-22 — End: 2018-09-20

## 2018-08-23 MED ORDER — MIRTAZAPINE 30 MG TABLET
ORAL_TABLET | Freq: Every evening | ORAL | 0 refills | 0 days | Status: CP
Start: 2018-08-23 — End: 2018-09-19

## 2018-08-23 MED ORDER — BICTEGRAVIR 50 MG-EMTRICITABINE 200 MG-TENOFOVIR ALAFENAM 25 MG TABLET
ORAL_TABLET | Freq: Every day | ORAL | 0 refills | 0.00000 days | Status: CP
Start: 2018-08-23 — End: 2018-09-19

## 2018-08-23 MED ORDER — NICOTINE 21 MG/24 HR DAILY TRANSDERMAL PATCH
MEDICATED_PATCH | TRANSDERMAL | 2 refills | 0 days | Status: CP
Start: 2018-08-23 — End: 2018-11-07

## 2018-08-23 MED ORDER — GABAPENTIN 600 MG TABLET
ORAL_TABLET | Freq: Three times a day (TID) | ORAL | 0 refills | 0.00000 days | Status: CP
Start: 2018-08-23 — End: 2018-09-19

## 2018-08-23 NOTE — Unmapped (Signed)
[facility] called requesting refill on patient medication. After looking in the chart the medication was sent to the wrong pharmacy. The Rx should all be sent to [facility] in [place].

## 2018-09-12 NOTE — Unmapped (Signed)
Pt sent in hmap app via email.    App incomplete due to missing:  1) proof of income: Brother's 2020 SS Award letter    Emailed pt regarding this    Larry Montes

## 2018-09-19 MED ORDER — GABAPENTIN 600 MG TABLET
ORAL_TABLET | 0 refills | 0 days | Status: CP
Start: 2018-09-19 — End: 2018-10-02

## 2018-09-19 MED ORDER — MIRTAZAPINE 30 MG TABLET
ORAL_TABLET | 0 refills | 0 days | Status: CP
Start: 2018-09-19 — End: 2018-10-02

## 2018-09-19 MED ORDER — BIKTARVY 50 MG-200 MG-25 MG TABLET
ORAL_TABLET | 0 refills | 0 days | Status: CP
Start: 2018-09-19 — End: 2018-10-02

## 2018-09-20 MED ORDER — SERTRALINE 100 MG TABLET
ORAL_TABLET | Freq: Every day | ORAL | 0 refills | 0.00000 days | Status: CP
Start: 2018-09-20 — End: 2018-10-02

## 2018-09-20 NOTE — Unmapped (Signed)
Left discreet VM requesting call back.     Need to relay to pt :    Larry Montes,   Please call patient and let them know I gave them 1 month supply of medication but they need to schedule a follow-up with me in order to get more refills since it's been a while since I saw him.   Thanks,   Minerva Areola

## 2018-09-20 NOTE — Unmapped (Signed)
PT returned call. Appt made for Tues 10/02/18 at 3:30pm.

## 2018-09-20 NOTE — Unmapped (Signed)
PHARMACY CALLED REQUESTING REFILL FOR PATIENT.

## 2018-10-02 ENCOUNTER — Encounter: Admit: 2018-10-02 | Discharge: 2018-10-03 | Payer: PRIVATE HEALTH INSURANCE

## 2018-10-02 DIAGNOSIS — F1010 Alcohol abuse, uncomplicated: Secondary | ICD-10-CM

## 2018-10-02 DIAGNOSIS — I10 Essential (primary) hypertension: Secondary | ICD-10-CM

## 2018-10-02 DIAGNOSIS — G629 Polyneuropathy, unspecified: Secondary | ICD-10-CM

## 2018-10-02 DIAGNOSIS — Z720 Tobacco use: Secondary | ICD-10-CM

## 2018-10-02 DIAGNOSIS — B20 Human immunodeficiency virus [HIV] disease: Principal | ICD-10-CM

## 2018-10-02 DIAGNOSIS — E785 Hyperlipidemia, unspecified: Secondary | ICD-10-CM

## 2018-10-02 LAB — ALT (SGPT): Alanine aminotransferase:CCnc:Pt:Ser/Plas:Qn:: 51 — ABNORMAL HIGH

## 2018-10-02 LAB — CBC W/ AUTO DIFF
BASOPHILS ABSOLUTE COUNT: 0 10*9/L (ref 0.0–0.1)
BASOPHILS RELATIVE PERCENT: 0.5 %
EOSINOPHILS ABSOLUTE COUNT: 0.1 10*9/L (ref 0.0–0.4)
EOSINOPHILS RELATIVE PERCENT: 2 %
HEMATOCRIT: 42.1 % (ref 41.0–53.0)
HEMOGLOBIN: 13.5 g/dL (ref 13.5–17.5)
LARGE UNSTAINED CELLS: 2 % (ref 0–4)
LYMPHOCYTES ABSOLUTE COUNT: 1.4 10*9/L — ABNORMAL LOW (ref 1.5–5.0)
LYMPHOCYTES RELATIVE PERCENT: 24.1 %
MEAN CORPUSCULAR HEMOGLOBIN: 32.1 pg (ref 26.0–34.0)
MEAN CORPUSCULAR VOLUME: 100 fL (ref 80.0–100.0)
MEAN PLATELET VOLUME: 8.3 fL (ref 7.0–10.0)
MONOCYTES ABSOLUTE COUNT: 0.4 10*9/L (ref 0.2–0.8)
MONOCYTES RELATIVE PERCENT: 6 %
NEUTROPHILS ABSOLUTE COUNT: 3.8 10*9/L (ref 2.0–7.5)
NEUTROPHILS RELATIVE PERCENT: 65.7 %
PLATELET COUNT: 181 10*9/L (ref 150–440)
RED BLOOD CELL COUNT: 4.22 10*12/L — ABNORMAL LOW (ref 4.50–5.90)
WBC ADJUSTED: 5.8 10*9/L (ref 4.5–11.0)

## 2018-10-02 LAB — LIPID PANEL
CHOLESTEROL: 222 mg/dL — ABNORMAL HIGH (ref 100–199)
HDL CHOLESTEROL: 64 mg/dL — ABNORMAL HIGH (ref 40–59)
NON-HDL CHOLESTEROL: 158 mg/dL
TRIGLYCERIDES: 498 mg/dL — ABNORMAL HIGH (ref 1–149)

## 2018-10-02 LAB — LDL CHOLESTEROL CALCULATED: Cholesterol.in LDL:MCnc:Pt:Ser/Plas:Qn:Calculated: 0

## 2018-10-02 LAB — BASIC METABOLIC PANEL
ANION GAP: 13 mmol/L (ref 7–15)
BLOOD UREA NITROGEN: 18 mg/dL (ref 7–21)
BUN / CREAT RATIO: 13
CALCIUM: 9.7 mg/dL (ref 8.5–10.2)
CHLORIDE: 102 mmol/L (ref 98–107)
CREATININE: 1.39 mg/dL — ABNORMAL HIGH (ref 0.70–1.30)
EGFR CKD-EPI AA MALE: 64 mL/min/{1.73_m2} (ref >=60)
EGFR CKD-EPI NON-AA MALE: 55 mL/min/{1.73_m2} — ABNORMAL LOW (ref >=60)
GLUCOSE RANDOM: 103 mg/dL (ref 70–179)
SODIUM: 142 mmol/L (ref 135–145)

## 2018-10-02 LAB — BUN / CREAT RATIO: Urea nitrogen/Creatinine:MRto:Pt:Ser/Plas:Qn:: 13

## 2018-10-02 LAB — AST (SGOT): Aspartate aminotransferase:CCnc:Pt:Ser/Plas:Qn:: 55

## 2018-10-02 LAB — RED BLOOD CELL COUNT: Lab: 4.22 — ABNORMAL LOW

## 2018-10-02 LAB — BILIRUBIN TOTAL: Bilirubin:MCnc:Pt:Ser/Plas:Qn:: 0.5

## 2018-10-02 LAB — FOLATE: Folate:MCnc:Pt:Ser/Plas:Qn:: 9.8

## 2018-10-02 LAB — VITAMIN B-12: Cobalamins:MCnc:Pt:Ser/Plas:Qn:: 279

## 2018-10-02 MED ORDER — MIRTAZAPINE 30 MG TABLET
ORAL_TABLET | Freq: Every evening | ORAL | 5 refills | 0.00000 days | Status: CP
Start: 2018-10-02 — End: 2019-04-02

## 2018-10-02 MED ORDER — SERTRALINE 100 MG TABLET
ORAL_TABLET | Freq: Every day | ORAL | 5 refills | 0.00000 days | Status: CP
Start: 2018-10-02 — End: ?

## 2018-10-02 MED ORDER — GABAPENTIN 600 MG TABLET
ORAL_TABLET | Freq: Three times a day (TID) | ORAL | 5 refills | 0.00000 days | Status: CP
Start: 2018-10-02 — End: 2019-04-02

## 2018-10-02 MED ORDER — BIKTARVY 50 MG-200 MG-25 MG TABLET
ORAL_TABLET | Freq: Every day | ORAL | 5 refills | 0 days | Status: CP
Start: 2018-10-02 — End: 2019-04-02

## 2018-10-03 LAB — SYPHILIS RPR SCREEN: Reagin Ab:PrThr:Pt:Ser:Ord:RPR: NONREACTIVE

## 2018-10-03 NOTE — Unmapped (Signed)
A financial assessment was completed to determine eligibility for patient financial assistance programs.   Completed assessment received via mail    Household FPL: 207%  Individual FPL: 206%    Insurance  Patient has a Location manager through the [employer] ([email or web address]).    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 RW flag was set based on IPL for Caps on Charges.     HMAP/PCAP (Premium Copay Assistance program) eligibility:   RENEWAL APPLICATION. Patient has coverage with the PCAP sub-program for the current period and is eligible for medication assistance. The renewal application is pending review for the next coverage period. PCAP helps patient with insurance premium and medication copays.    Other PCAP sub-program details:   - Patient can fill their medications at any pharmacy within the health insurance plan pharmacy network.   -If the patient's insurance premium changes, the patient must provide an insurance billing statement showing this NEW premium amount (i.e. after open enrollment between Nov. & Dec. when the patient re-enrolls in a plan for the next year).     Patient was informed about the following programs:   [facility] for financial assistance for medical and hospital bills. Patient was provided financial assistance material and referred to a [facility].     Patient was referred to the following programs:   None needed at this time    Additional Comments:    Time Duration of intervention in minutes:  10    Sherilynn Phen

## 2018-10-03 NOTE — Unmapped (Signed)
It was great to see you today.    Contacting us   During working hours  [phone]  After hours or weekends [phone] and ask for the ID doctor on call  Fax number   [phone]    MEDICATIONS  For refills, please contact your pharmacy and ask them to electronically send or fax the request to the clinic.     Please bring all medications in original bottles to every appointment.    HMAP (formerly ADAP) or [employer] Eligibility (required even if you do not receive medication through [facility])  Please remember to renew your Juanell Fairly eligibility during renewal periods which occur twice a year: January-March and July-September.     The following are needed for each renewal:   - [facility] Identification (if you don't have one, then a bill with your name and address in [place])   - proof of income (award letter, W-2, or last three check stubs)   If you are unable to come in for renewal, let us know if we can mail, fax or e-mail paperwork to you.   HMAP Contact: [phone]      Urgent Care Clinic  Monday, Tuesday, and Thursday from 8:30 - 12 noon  Please call ahead to speak with the nursing staff if you think you need to be seen urgently!    Lab info:  Your most recent CD4 T-cell counts and viral loads are below. Here are a few things to keep in mind when looking at your numbers:  ?? For most people, we're checking CD4 counts fairly infrequently (once a year or less)  ?? It's normal for your CD4 count to be different from visit to visit.   ?? We consider your viral load to be undetectable if it says <40 or if it says Not detected.  ?? Our goal is to get your virus to be undetectable and keep it undetectable. You can help by taking your medications at about the same time, every single day. If you're having trouble with taking your medications, it's important to let us know.    YOUR RECENT LAB RESULTS:  CD4 and VIRAL LOAD  Lab Results   Component Value Date    ACD4 889 05/07/2018    HIVRS Detected (A) 05/07/2018

## 2018-10-03 NOTE — Unmapped (Addendum)
Assessment/Plan:      Akaash Vandewater, a 58 y.o. male seen today for routine HIV follow up.    Plan:    Emre was seen today for HIV positive/aids.    Diagnoses and all orders for this visit:    Mr. Diloreto is a 58 yo man with a history of well controlled HIV, tobacco use disorder, daily alcohol use, TIA, HLD, HTN, depression here for follow-up. He does not have a primary care provider at this time, but plans to establish with a PCP within the next 1-2 months, which was highly encouraged considering his multiple comorbidities. He is interested in continuing to work on decreasing his tobacco use but is not interested in cutting down on daily alcohol use.    HIV  - Has been on Biktarvy since 09/2017. Prior to this he was on Genvoya. His VL has been <40 over the past 5+ years and his CD4 was >800 as of September 2019. He is taking Biktarvy daily without any recent missed doses.   ?? Continue current therapy. E-prescribed today.  ?? Checking HIV RNA & safety labs (brief return).  ?? Encouraged continued excellent ARV adherence.  ?? No need for OI prophylaxis as CD4= 889  We reviewed how to take medications and importance of NOT intermittently interrupting medications.  ?? We discussed importance of separating administration of integrase inhibitor from dietary supplements and antacids.  ?? CD4 counts have been stably over 300 for >2Y on suppressive ART, so rechecks will now be annual (per US-DHHS guidelines).  Lab Results   Component Value Date    ACD4 889 05/07/2018    CD4 57 05/07/2018    HIVCP <40 (H) 05/07/2018    HIVRS Detected (A) 05/07/2018     Essential hypertension  - Well controlled on Lisinopril and HCTZ. Continue current regimen.    H/o TIA:  - On Plavix since 04/2016 per [facility] neurology. Recently stopped Plavix on his own but has restarted aspirin. Advised to follow-up with neurology to discuss recommendations.    Neuropathy  - He continues to experience neuropathy in his bilateral feet. Improved with gabapentin. We discussed importance of decreasing alcohol use, as this is likely a major contributor to his symptoms.   -     gabapentin (NEURONTIN) 600 MG tablet; Take 1 tablet (600 mg total) by mouth Three (3) times a day.  -     Vitamin B12 (Cobalamin); Future  -     Folate; Future  -     Syphilis Screen; Future    Hyperlipidemia, unspecified hyperlipidemia type  - History of hyperlipidemia, specifically elevated triglycerides. He has been on Pravastatin 40 mg for a number of years. Will recheck lipids today and adjust statin as needed. Discussed role of alcohol use and obesity in elevated triglycerides.    Tobacco use  - Started smoking at age 11, smoked 1 ppd for most of his life, but now at 10cpd.  He has been using the nicotine patch and requests refills. His partner, who he lives with, also smokes, which has made it difficult for Renae Fickle to quit. They plan to move to a new home this summer where they will not smoke in the home, which should help him decrease his use.  He did not like the taste of nicotine gum. He may try to buy some nicotine lozenges OTC. He could also use sugar free gum with cravings. He tried Chantix in the past but this caused psychiatric side effects.  - Continue  21mg  patch  - Lung Cancer screening 04/2018 with: Emphysema. Subcentimeter bilateral solid and groundglass nodules measuring up to 3 mm. Lung-RADS Category: 2 Follow-up Recommendation: Follow Up LDCT in 1 Year    Alcohol abuse, daily use  - He continues to drink vodka daily, usually 6+ servings, although he doesn't measure. He has been working with his previous ID provider to obtain naltrexone, but has never initiated it. He is not interested in decreasing his alcohol consumption at this time. He declines to meet with substance use counselor.    Mental Health  - History of depression, and has been on Zoloft and Remeron for a number of years. He reports his mood is stable. He feels medication is working well. Advised regular exercise, good sleep hygiene, to reach out if he develops any worsening symptoms. May benefit from counseling, but not interested at this time.  -     mirtazapine (REMERON) 30 MG tablet; Take 1 tablet (30 mg total) by mouth nightly.  -     sertraline (ZOLOFT) 100 MG tablet; Take 2 tablets (200 mg total) by mouth daily.    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 Pt says has been there for years and is unchanged.  -??Not addressed today.    Sexual health & secondary prevention  Sex with men. Monogamous with single partner. Declines STI testing today.     Lab Results   Component Value Date    RPR Nonreactive 10/02/2018    CTNAA Negative 05/10/2017    GCNAA Negative 05/10/2017    SPECTYPE Swab 05/10/2017    SPECSOURCE Rectal-[place] Only 05/10/2017     ?? RPR -- for screening obtained today    Health maintenance  Lab Results   Component Value Date    CREATININE 1.39 (H) 10/02/2018    QFTTBGOLD Negative 03/24/2016    HEPCAB Nonreactive 11/09/2016    HCVRNA Not Detected 10/01/2015    CHOL 222 (H) 10/02/2018    HDL 64 (H) 10/02/2018    LDL  10/02/2018      Comment:      Unable to calculate due to triglyceride greater than 400 mg/dL.    NONHDL 158 10/02/2018    TRIG 498 (H) 10/02/2018    PSA 0.90 05/10/2017    A1C 5.4 11/09/2016    FINALDX  07/07/2017     A.  Colon, transverse, biopsy  - Hyperplastic polyp (multiple fragments)    B.  Colon, descending, biopsy  - Hyperplastic polyp (multiple fragments)    C.  Colon, sigmoid, biopsy  - Hyperplastic polyp (multiple fragments)    D.  Colon, sigmoid #2, biopsy  - Hyperplastic polyp (multiple fragments)    E.  Colon, rectum, biopsy  - Hyperplastic polyp (multiple fragments)         Communicable diseases  # TB - no longer needed; negative 03/24/2016 and low/no risk  # HCV - negative 11/09/2016; rescreen w/Ab q1-2y    Cancer screening  # Anorectal - neg 05/2017- DUE- To address at follow-up  # Colorectal - Numerous colon polyps on 05/2017 c-scope. Per Dr. Fara Boros, 10 year follow-up recommended  # Liver - not applicable  # Lung - neg 04/2018. Repeat in 12 months    Cardiovascular disease  # The 10-year ASCVD risk score Denman George DC Jr., et al., 2013) is: 12.2%    Immunization History   Administered Date(s) Administered   ??? Hepatitis A 04/21/2010, 12/29/2010   ??? Hepatitis B, Adult 07/08/2009, 11/04/2009, 04/21/2010, 05/10/2017   ???  INFLUENZA TIV (TRI) PF (IM) 07/08/2009, 04/21/2010, 06/23/2011   ??? Influenza Vaccine Quad (IIV4 PF) 21mo+ injectable 06/28/2012, 05/02/2013, 08/28/2014, 10/01/2015, 05/05/2016, 05/10/2017, 05/07/2018   ??? PNEUMOCOCCAL POLYSACCHARIDE 23 02/19/2009, 02/26/2015   ??? PPD Test 11/04/2009, 12/29/2010   ??? Pneumococcal Conjugate 13-Valent 03/27/2014   ??? TdaP 02/19/2009     ?? Screening ordered today: none  ?? Immunizations ordered today: none    Counseling services took 15 minutes of today's visit time.  Counseled as documented above regarding decrease in substance use, medication adherence, weight management and healthy lifestyle choices and need for recommended screening tests.    Next Visit:  - Established PCP?  - Follow-up on hyperlipidemia   - Reassess alcohol/tobacco use  - Anal pap    Disposition  Return to clinic 5-6 months or sooner if needed.    Lahoma Rocker, PA-C  [facility]   [address], 1st floor   [place], [place]. [address]   Phone: [phone]   Fax: [phone]     Subjective:      Chief Complaint   HIV followup    HPI  Return patient visit for Larry Montes, a 58 y.o. male.     - Taking Biktarvy every day. He has not missed any doses in the last month.  - He continues to smoke 10cpd. This is down from his past use of 1ppd but up from our last visit, when he was at 4-5 cpd. He lives with his partner, who also smokes, so it has been difficult to cut down. His partner is not interested in quitting. He is using the 21mg  patch, which helps with cravings. Not interested in Chantix.  - He continues to drink daily- will fill up large glass 3/4 of the way with vodka, but doesn't measure amount. He does not see an issue with his drinking. He knows he should cut back but isn't interested in meeting with a counselor to discuss approach to this.  - He reports he stopped taking Plavix a while ago but it is unclear why. He has not followed up with Neurology after a TIA in 2017. He plans to do so.  - He will establish care with PCP closer to home. He plans to do so within the next month.  - He reports ongoing neuropathy in his feet bilaterally. He feels this is the same as in the past, not worsening.     Past Medical History:   Diagnosis Date   ??? Alcohol abuse     Hospitalized in the past - has been able to abstain for periods of time   ??? Depression    ??? HIV disease (CMS-HCC)    ??? Hyperlipidemia    ??? Hypertension      Medications and Allergies   Reviewed and updated today. See bottom of this visit's encounter summary for details.  Current Outpatient Medications on File Prior to Visit   Medication Sig   ??? aspirin (ECOTRIN) 81 MG tablet Take 81 mg by mouth daily.   ??? hydroCHLOROthiazide (HYDRODIURIL) 25 MG tablet Take 1 tablet (25 mg total) by mouth daily.   ??? lisinopril (PRINIVIL,ZESTRIL) 40 MG tablet Take 1 tablet (40 mg total) by mouth daily.   ??? nicotine (NICODERM CQ) 21 mg/24 hr patch Place 1 patch on the skin daily.   ??? omeprazole (PRILOSEC) 20 MG capsule TAKE 1 CAPSULE(20 MG) BY MOUTH DAILY   ??? pravastatin (PRAVACHOL) 40 MG tablet TAKE 1 TABLET(40 MG) BY MOUTH DAILY   ???  clopidogrel (PLAVIX) 75 mg tablet Take 75 mg by mouth.     No current facility-administered medications on file prior to visit.      Allergies   Allergen Reactions   ??? Chantix [Varenicline] Other (See Comments)     CNS symptoms - anxiety, agitation   ??? Pollen Extracts Other (See Comments)     Congestion         Social History  Lives with his >22yr male partner in [place]. 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 Likes his job but wishes it paid more.  ??  Has sex with men only. Bottom (receptive) only. Gives and receives oral sex.  No personal h/o STDs -- says he was celibate for years before meeting his current partner.  ??  Past history of IV cocaine use in the late 1990s, but nothing since.  Uses MJ occasionally. Heavy ETOH (6+ servings of vodka/day). Ongoing smoking 1/2ppd.  Social History     Tobacco Use   ??? Smoking status: Current Every Day Smoker     Packs/day: 1.00     Start date: 05/02/1973   ??? Smokeless tobacco: Never Used   Substance Use Topics   ??? Alcohol use: Yes     Alcohol/week: 40.0 standard drinks     Types: 40 Shots of liquor per week     Comment: 1/2 gallon of vodka per week       Review of Systems  As per HPI. Remainder of 10 systems reviewed, negative.      Objective:      BP 123/78  - Pulse 96  - Temp 37.2 ??C (99 ??F)   Wt Readings from Last 3 Encounters:   05/07/18 (!) 106.6 kg (235 lb)   09/13/17 (!) 110 kg (242 lb 9.6 oz)   07/07/17 (!) 111.6 kg (246 lb)     Const overweight,, looks well and attentive, alert, appropriate   Eyes sclerae anicteric, noninjected OU   ENT no thrush, leukoplakia or oral lesions   Lymph no cervical or supraclavicular LAD   CV RRR. No murmurs. No rub or gallop. S1/S2.   Resp CTAB ant/post, normal work of breathing and no clubbing or cyanosis   GI Soft, no organomegaly. NTND. NABS.   GU deferred   Rectal deferred   Skin no petechiae, ecchymoses or obvious rashes on clothed exam   MSK no joint tenderness and normal ROM throughout   Neuro CN II-XII grossly intact, MAEE, non focal   Psych Appropriate affect. Eye contact good. Linear thoughts. Fluent speech.     Laboratory Data  Reviewed in Epic today, using Synopsis and Chart Review filters.    Lab Results   Component Value Date    CREATININE 1.39 (H) 10/02/2018    QFTTBGOLD Negative 03/24/2016    HEPCAB Nonreactive 11/09/2016    HCVRNA Not Detected 10/01/2015    CHOL 222 (H) 10/02/2018    HDL 64 (H) 10/02/2018    LDL  10/02/2018      Comment: Unable to calculate due to triglyceride greater than 400 mg/dL.    NONHDL 158 10/02/2018    TRIG 498 (H) 10/02/2018    PSA 0.90 05/10/2017    A1C 5.4 11/09/2016    FINALDX  07/07/2017     A.  Colon, transverse, biopsy  - Hyperplastic polyp (multiple fragments)    B.  Colon, descending, biopsy  - Hyperplastic polyp (multiple fragments)    C.  Colon, sigmoid, biopsy  -  Hyperplastic polyp (multiple fragments)    D.  Colon, sigmoid #2, biopsy  - Hyperplastic polyp (multiple fragments)    E.  Colon, rectum, biopsy  - Hyperplastic polyp (multiple fragments)

## 2018-10-04 LAB — HIV RNA, QUANTITATIVE, PCR

## 2018-10-04 LAB — HIV RNA COMMENT: Lab: 0

## 2018-10-09 MED ORDER — ATORVASTATIN 40 MG TABLET
ORAL_TABLET | Freq: Every day | ORAL | 3 refills | 0 days | Status: CP
Start: 2018-10-09 — End: 2019-04-02

## 2018-10-09 MED ORDER — THIAMINE HCL (VITAMIN B1) 100 MG TABLET
ORAL_TABLET | Freq: Every day | ORAL | 11 refills | 0.00000 days | Status: CP
Start: 2018-10-09 — End: 2019-10-09

## 2018-10-09 NOTE — Unmapped (Addendum)
1) Triglycerides significantly elevated and total cholesterol 222. Discussed results with patient and will change from Pravastatin to Atorvastatin 40mg  daily. Discussed role of diet on cholesterol level, including heavy alcohol use. Encouraged patient to think about engaging in treatment to decrease alcohol use.  2) Advised to continue aspirin for secondary stroke prevention.  3) Will Start patient on thiamine 100mg  qd and OTC multivitamin for chronic alcohol use. Advised to space multivitamin and Biktarvy by 6 hours.    Patient verbalized understanding and denied questions.

## 2018-10-09 NOTE — Unmapped (Signed)
Addended byMicheline Maze, Lajuan Kovaleski on: 10/09/2018 11:14 AM     Modules accepted: Orders

## 2018-10-09 NOTE — Unmapped (Signed)
Received VM from pt 10/08/18 at 3:30pm stating they are returning Micheline Maze, PA-C call regarding labs. Pt request a call back at office phone number on chart.     Message forwarded to provider.

## 2018-10-10 ENCOUNTER — Other Ambulatory Visit: Payer: Self-pay

## 2018-10-10 ENCOUNTER — Ambulatory Visit
Admission: EM | Admit: 2018-10-10 | Discharge: 2018-10-10 | Disposition: A | Discharge status: Home / Self Care | Payer: BLUE CROSS/BLUE SHIELD | Attending: Family Medicine | Admitting: Family Medicine

## 2018-10-10 DIAGNOSIS — F17200 Nicotine dependence, unspecified, uncomplicated: Secondary | ICD-10-CM | POA: Diagnosis not present

## 2018-10-10 DIAGNOSIS — J111 Influenza due to unidentified influenza virus with other respiratory manifestations: Secondary | ICD-10-CM

## 2018-10-10 DIAGNOSIS — J101 Influenza due to other identified influenza virus with other respiratory manifestations: Secondary | ICD-10-CM | POA: Diagnosis not present

## 2018-10-10 HISTORY — DX: Bell's palsy: G51.0

## 2018-10-10 LAB — RAPID INFLUENZA A&B ANTIGENS
Influenza A (ARMC): POSITIVE — AB
Influenza B (ARMC): NEGATIVE

## 2018-10-10 MED ORDER — OSELTAMIVIR PHOSPHATE 75 MG PO CAPS: 75.0000 mg | ORAL_CAPSULE | Freq: Two times a day (BID) | ORAL | 0 refills | Status: DC

## 2018-10-10 MED ORDER — HYDROCODONE-HOMATROPINE 5-1.5 MG/5ML PO SYRP: 5.0000 mL | ORAL_SOLUTION | Freq: Four times a day (QID) | ORAL | 0 refills | Status: DC | PRN

## 2018-10-10 NOTE — ED Triage Notes (Signed)
Pt with 2 days of cough, bodyaches, and reports fever 100.3

## 2018-10-10 NOTE — Discharge Instructions (Signed)
Rest. Fluids.  Medication as prescribed.   Take care  Dr. Samone Guhl  

## 2018-10-10 NOTE — ED Provider Notes (Signed)
[facility]    CSN: [id number] Arrival date & time: 10/10/18  1152   History   Chief Complaint Chief Complaint  Patient presents with  . Cough  . Generalized Body Aches   HPI  58 year old male presents with concern for influenza.  Patient reports that his symptoms started on Monday.  He reports cough, chills, body aches, fever.  Fever has been as high as 100.3.  Patient states that he feels like he has the flu.  No reported sick contacts.  No known exacerbating factors.  Patient reports associated rib pain from his cough.  Moderate in severity.  No other associated symptoms.  No other complaints.  Hx reviewed as below. Past Medical History:  Diagnosis Date  . Anxiety   . Bell's palsy   . Depression   . HIV (human immunodeficiency virus infection) ([place])   . Hypertension    Patient Active Problem List   Diagnosis Date Noted  . TIA (transient ischemic attack) 05/04/2016   Home Medications    Prior to Admission medications   Medication Sig Start Date End Date Taking? Authorizing Provider  aspirin EC 81 MG tablet Take 81 mg by mouth daily.   Yes [provider]  bictegravir-emtricitabine-tenofovir AF (BIKTARVY) 50-200-25 MG TABS tablet Take by mouth. 09/13/17  Yes [provider]  gabapentin (NEURONTIN) 600 MG tablet Take 600 mg by mouth 3 (three) times daily.    [provider]  hydrochlorothiazide (HYDRODIURIL) 25 MG tablet Take 25 mg by mouth daily.    [provider]  HYDROcodone-homatropine (HYCODAN) 5-1.5 MG/5ML syrup Take 5 mLs by mouth every 6 (six) hours as needed. 10/10/18   Coral Spikes, DO  lisinopril (PRINIVIL,ZESTRIL) 40 MG tablet Take 40 mg by mouth daily.    [provider]  mirtazapine (REMERON) 30 MG tablet Take 30 mg by mouth at bedtime.    [provider]  omeprazole (PRILOSEC) 20 MG capsule Take 20 mg by mouth daily.    [provider]  oseltamivir (TAMIFLU) 75 MG capsule Take 1 capsule  (75 mg total) by mouth every 12 (twelve) hours. 10/10/18   Coral Spikes, DO  pravastatin (PRAVACHOL) 40 MG tablet Take 40 mg by mouth daily.    [provider]  sertraline (ZOLOFT) 100 MG tablet Take 200 mg by mouth daily.    [provider]    Family History Family History  Problem Relation Age of Onset  . CAD Father     Social History Social History   Tobacco Use  . Smoking status: Current Every Day Smoker    Packs/day: 0.50    Types: Cigarettes  . Smokeless tobacco: Never Used  Substance Use Topics  . Alcohol use: Yes    Comment: 1/4 half gallon vodka monthly  . Drug use: No   Allergies   Varenicline and Pollen extract   Review of Systems Review of Systems  Constitutional: Positive for chills, fatigue and fever.  Respiratory: Positive for cough.   Musculoskeletal:       Body aches.   Physical Exam Triage Vital Signs ED Triage Vitals  Enc Vitals Group     BP 10/10/18 1209 (!) 144/81     Pulse Rate 10/10/18 1209 100     Resp 10/10/18 1209 18     Temp 10/10/18 1209 98.4 F (36.9 C)     Temp Source 10/10/18 1209 Oral     SpO2 10/10/18 1209 96 %     Weight 10/10/18 1210  240 lb (108.9 kg)     Height 10/10/18 1210 5\' 10"  (1.778 m)     Head Circumference --      Peak Flow --      Pain Score 10/10/18 1209 6     Pain Loc --      Pain Edu? --      Excl. in [place]? --    Updated Vital Signs BP (!) 144/81 (BP Location: Right Arm)   Pulse 100   Temp 98.4 F (36.9 C) (Oral)   Resp 18   Ht 5\' 10"  (1.778 m)   Wt 108.9 kg   SpO2 96%   BMI 34.44 kg/m   Visual Acuity Right Eye Distance:   Left Eye Distance:   Bilateral Distance:    Right Eye Near:   Left Eye Near:    Bilateral Near:     Physical Exam Constitutional:      General: He is not in acute distress.    Appearance: Normal appearance.  HENT:     Head: Normocephalic and atraumatic.     Mouth/Throat:     Pharynx: Oropharynx is clear.  Eyes:     General:        Right eye: No  discharge.        Left eye: No discharge.     Conjunctiva/sclera: Conjunctivae normal.  Cardiovascular:     Rate and Rhythm: Regular rhythm. Tachycardia present.  Pulmonary:     Effort: Pulmonary effort is normal.     Breath sounds: Normal breath sounds.  Neurological:     Mental Status: He is alert.  Psychiatric:        Mood and Affect: Mood normal.        Behavior: Behavior normal.    UC Treatments / Results  Labs (all labs ordered are listed, but only abnormal results are displayed) Labs Reviewed  RAPID INFLUENZA A&B ANTIGENS ([facility] ONLY) - Abnormal; Notable for the following components:      Result Value   Influenza A ([facility]) POSITIVE (*)    All other components within normal limits    EKG None  Radiology No results found.  Procedures Procedures (including critical care time)  Medications Ordered in UC Medications - No data to display  Initial Impression / Assessment and Plan / UC Course  I have reviewed the triage vital signs and the nursing notes.  Pertinent labs & imaging results that were available during my care of the patient were reviewed by me and considered in my medical decision making (see chart for details).    58 year old male presents with influenza. Treating with tamiflu. Hycodan for cough.  Final Clinical Impressions(s) / UC Diagnoses   Final diagnoses:  Influenza     Discharge Instructions     Rest. Fluids.  Medication as prescribed.  Take care  Dr. Lacinda Axon    ED Prescriptions    Medication Sig Dispense Auth. Provider   oseltamivir (TAMIFLU) 75 MG capsule Take 1 capsule (75 mg total) by mouth every 12 (twelve) hours. 10 capsule Autumne Kallio G, DO   HYDROcodone-homatropine (HYCODAN) 5-1.5 MG/5ML syrup Take 5 mLs by mouth every 6 (six) hours as needed. 120 mL Coral Spikes, DO     Controlled Substance Prescriptions [place] Controlled Substance Registry consulted? Not Applicable   Coral Spikes, DO 10/10/18 1338

## 2018-11-07 MED ORDER — NICOTINE 21 MG/24 HR DAILY TRANSDERMAL PATCH
MEDICATED_PATCH | 2 refills | 0 days | Status: CP
Start: 2018-11-07 — End: 2019-02-14

## 2018-12-13 MED ORDER — OMEPRAZOLE 20 MG CAPSULE,DELAYED RELEASE
ORAL_CAPSULE | 3 refills | 0 days | Status: CP
Start: 2018-12-13 — End: ?

## 2019-02-14 MED ORDER — NICOTINE 14 MG/24 HR DAILY TRANSDERMAL PATCH
MEDICATED_PATCH | TRANSDERMAL | 0 refills | 0.00000 days | Status: CP
Start: 2019-02-14 — End: 2019-04-02

## 2019-02-14 NOTE — Unmapped (Signed)
Received RF request for nicoderm - step down to 14 mg patch  Provided 1 month supply.  Andres Ege, FNP  Covering for Lahoma Rocker PA

## 2019-02-15 MED ORDER — HYDROCHLOROTHIAZIDE 25 MG TABLET
ORAL_TABLET | 0 refills | 0 days | Status: CP
Start: 2019-02-15 — End: 2019-04-02

## 2019-04-02 ENCOUNTER — Telehealth: Admit: 2019-04-02 | Discharge: 2019-04-03 | Payer: PRIVATE HEALTH INSURANCE

## 2019-04-02 DIAGNOSIS — Z720 Tobacco use: Secondary | ICD-10-CM | POA: Diagnosis not present

## 2019-04-02 DIAGNOSIS — I10 Essential (primary) hypertension: Secondary | ICD-10-CM | POA: Diagnosis not present

## 2019-04-02 DIAGNOSIS — B20 Human immunodeficiency virus [HIV] disease: Secondary | ICD-10-CM | POA: Diagnosis not present

## 2019-04-02 DIAGNOSIS — G459 Transient cerebral ischemic attack, unspecified: Secondary | ICD-10-CM | POA: Diagnosis not present

## 2019-04-02 DIAGNOSIS — E782 Mixed hyperlipidemia: Secondary | ICD-10-CM

## 2019-04-02 DIAGNOSIS — F1010 Alcohol abuse, uncomplicated: Secondary | ICD-10-CM

## 2019-04-02 NOTE — Unmapped (Signed)
Assessment/Plan:      Larry Montes, a 58 y.o. male seen today for routine HIV follow up.    I spent 27 minutes on the phone with the patient. I spent an additional 20 minutes on pre- and post-visit activities.     The patient was physically located in [place] or a state in which I am permitted to provide care. The patient and/or parent/guardian understood that s/he may incur co-pays and cost sharing, and agreed to the telemedicine visit. The visit was reasonable and appropriate under the circumstances given the patient's presentation at the time.    The patient and/or parent/guardian has been advised of the potential risks and limitations of this mode of treatment (including, but not limited to, the absence of in-person examination) and has agreed to be treated using telemedicine. The patient's/patient's family's questions regarding telemedicine have been answered.     If the visit was completed in an ambulatory setting, the patient and/or parent/guardian has also been advised to contact their provider???s office for worsening conditions, and seek emergency medical treatment and/or call 911 if the patient deems either necessary.    Plan:    Larry Montes is a 58 yo man with a history of well controlled HIV, tobacco use disorder, daily alcohol use, TIA, HLD, HTN, depression here for follow-up. He does not have a primary care provider at this time, but plans to establish with a PCP. He is interested in continuing to work on decreasing his tobacco and alcohol use.    HIV  - Has been on Biktarvy since 09/2017. Prior to this he was on Genvoya. His VL has been <40 over the past 5+ years and his CD4 was >800 as of September 2019. He is taking Biktarvy daily without any recent missed doses.   ?? Continue current therapy. E-prescribed today.  ?? Checking CD4, HIV RNA, & safety labs (full return). Will present for in person visit in 2 weeks- can obtain labs at that visit.   ?? Encouraged continued excellent ARV adherence.  ?? No need for OI prophylaxis as CD4= 889  We reviewed how to take medications and importance of NOT intermittently interrupting medications.  ?? We discussed importance of separating administration of integrase inhibitor from dietary supplements and antacids.  ?? CD4 counts have been stably over 300 for >2Y on suppressive ART, so rechecks will now be annual (per [place]-DHHS guidelines).  Lab Results   Component Value Date    ACD4 889 05/07/2018    CD4 57 05/07/2018    HIVCP <40 (H) 05/07/2018    HIVRS Not Detected 10/02/2018     Essential hypertension  - Well controlled on Lisinopril and HCTZ in the past. Continue current regimen.    Low back pain with radiculopathy  - Longstanding history of bilateral low back and buttock pain with radiculopathy.  MRI 12/2008 with spinal stenosis and multilevel DDD.  - He reports this has become more bothersome recently, causing pain after even short periods of standing. No red flag symptoms, but advised he follow-up with local orthopedic doctor within the next 1-2 weeks.   - We discussed ER precautions should symptoms worsen.    H/o TIA:  - On Plavix since 04/2016 per [facility] neurology. Recently stopped Plavix on his own but has restarted aspirin.   -Follow-up with neurology to discuss recommendations.    Neuropathy  - He continues to experience neuropathy in his bilateral feet. Improved with gabapentin but still with daily discomfort.   - Vit  B12 and Folate WNL.   - Increase Gabapentin to 900mg  TID.   - We discussed importance of decreasing alcohol use, as this is likely a major contributor to his symptoms.    Hyperlipidemia, unspecified hyperlipidemia type  - At last visit, triglycerides significantly elevated and total cholesterol 222. Changed from Pravastatin to Atorvastatin 40mg  daily, which he is tolerating.  -Discussed role of diet on cholesterol level, including heavy alcohol use.   - Continue Atorvastatin 40mg .     Alcohol abuse, daily use  - He continues to drink vodka daily, but has decreased by around 50% (now drinking 3/4 glass of vodka daily).  - Congratulated on this progress!  - Now on thiamine 100mg  qd and OTC multivitamin once daily.  -He has been working with his previous ID provider to obtain naltrexone, but has never initiated it.    Mental Health  - History of depression, and has been on Zoloft and Remeron for a number of years.   -Mood is stable; feels medication is working well.   - Continue current regimen.     Tobacco use- Not fully addressed at this visit. Due for LDCT.  - Started smoking at age 10, smoked 1 ppd for most of his life, but now at 10cpd.  He has been using the nicotine patch and requests refills. His partner, who he lives with, also smokes, which has made it difficult for Larry Montes to quit. They plan to move to a new home this summer where they will not smoke in the home, which should help him decrease his use.  He did not like the taste of nicotine gum. He may try to buy some nicotine lozenges OTC. He could also use sugar free gum with cravings. He tried Chantix in the past but this caused psychiatric side effects.  - Continue 21mg  patch  - Lung Cancer screening 04/2018 with: Emphysema. Subcentimeter bilateral solid and groundglass nodules measuring up to 3 mm. 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  -??Not addressed today.    Sexual health & secondary prevention  Sex with men. Monogamous with single partner. Declines STI testing today.     Lab Results   Component Value Date    RPR Nonreactive 10/02/2018    CTNAA Negative 05/10/2017    GCNAA Negative 05/10/2017    SPECTYPE Swab 05/10/2017    SPECSOURCE Rectal- Only 05/10/2017     Health maintenance  Lab Results   Component Value Date    CREATININE 1.39 (H) 10/02/2018    QFTTBGOLD Negative 03/24/2016    HEPCAB Nonreactive 11/09/2016    HCVRNA Not Detected 10/01/2015    CHOL 222 (H) 10/02/2018    HDL 64 (H) 10/02/2018    LDL  10/02/2018      Comment:      Unable to calculate due to triglyceride greater than 400 mg/dL.    NONHDL 158 10/02/2018    TRIG 498 (H) 10/02/2018    PSA 0.90 05/10/2017    A1C 5.4 11/09/2016    FINALDX  07/07/2017     A.  Colon, transverse, biopsy  - Hyperplastic polyp (multiple fragments)    B.  Colon, descending, biopsy  - Hyperplastic polyp (multiple fragments)    C.  Colon, sigmoid, biopsy  - Hyperplastic polyp (multiple fragments)    D.  Colon, sigmoid #2, biopsy  - Hyperplastic polyp (multiple fragments)  E.  Colon, rectum, biopsy  - Hyperplastic polyp (multiple fragments)         Communicable diseases  # TB - no longer needed; negative 03/24/2016 and low/no risk  # HCV - negative 11/09/2016; rescreen w/Ab q1-2y    Cancer screening  # Anorectal - neg 05/2017- DUE- To address at follow-up  # Colorectal - Numerous colon polyps on 05/2017 c-scope. Per Dr. Fara Boros, 10 year follow-up recommended  # Liver - not applicable  # Lung - neg 04/2018. Repeat in 12 months    Cardiovascular disease  # The ASCVD Risk score Denman George DC Montez Hageman, et al., 2013) failed to calculate.    Immunization History   Administered Date(s) Administered   ??? Hepatitis A 04/21/2010, 12/29/2010   ??? Hepatitis B, Adult 07/08/2009, 11/04/2009, 04/21/2010, 05/10/2017   ??? INFLUENZA TIV (TRI) PF (IM) 07/08/2009, 04/21/2010, 06/23/2011   ??? Influenza Vaccine Quad (IIV4 PF) 90mo+ injectable 06/28/2012, 05/02/2013, 08/28/2014, 10/01/2015, 05/05/2016, 05/10/2017, 05/07/2018   ??? PNEUMOCOCCAL POLYSACCHARIDE 23 02/19/2009, 02/26/2015   ??? PPD Test 11/04/2009, 12/29/2010   ??? Pneumococcal Conjugate 13-Valent 03/27/2014   ??? TdaP 02/19/2009     ?? Screening ordered today: none  ?? Immunizations ordered today: none    Counseling services took 15 minutes of today's visit time.  Counseled as documented above regarding decrease in substance use, medication adherence, weight management and healthy lifestyle choices and need for recommended screening tests.    Next Visit:  - Discuss radiculopathy symptoms and swelling of feet.  - Established PCP?  - Reassess alcohol/tobacco use  - Anal pap    Disposition  Return to clinic 4-6 weeks or sooner if needed.    Lahoma Rocker, PA-C  [facility]   [address] 1st floor   [place], [place]. [address]   Phone: [phone]   Fax: [phone]     Subjective:      Chief Complaint   HIV followup    HPI  Return patient visit for Larry Montes, a 58 y.o. male.     -Larry Montes is taking Biktarvy every day and denies any missed doses of the medication. He does not have any side effects from the medicine.     - Main concern today is pain in his buttock that radiates down his leg. He reports this has been going on for almost a year but that it seems to be getting worse. The pain started mainly on the right buttock and right side but is now bilateral. It is worsened by standing for long amounts of time. He denies any loss of bowel/bladder control, numbness in his pelvic area, leg weakness.     - He has been trying to decrease his vodka consumption. In the past he reports drinking 1-2 glasses of vodka and now he tries to keep it to around 3/4 of a glass.     Past Medical History:   Diagnosis Date   ??? Alcohol abuse     Hospitalized in the past - has been able to abstain for periods of time   ??? Depression    ??? HIV disease (CMS-HCC)    ??? Hyperlipidemia    ??? Hypertension      Medications and Allergies   Reviewed and updated today. See bottom of this visit's encounter summary for details.  Current Outpatient Medications on File Prior to Visit   Medication Sig   ??? aspirin (ECOTRIN) 81 MG tablet Take 81 mg by mouth daily.   ??? atorvastatin (  LIPITOR) 40 MG tablet Take 1 tablet (40 mg total) by mouth daily.   ??? bictegrav-emtricit-tenofov ala (BIKTARVY) 50-200-25 mg tablet Take 1 tablet by mouth daily.   ??? clopidogrel (PLAVIX) 75 mg tablet Take 75 mg by mouth.   ??? gabapentin (NEURONTIN) 600 MG tablet Take 1 tablet (600 mg total) by mouth Three (3) times a day.   ??? hydroCHLOROthiazide (HYDRODIURIL) 25 MG tablet TAKE 1 TABLET(25 MG) BY MOUTH DAILY   ??? lisinopril (PRINIVIL,ZESTRIL) 40 MG tablet Take 1 tablet (40 mg total) by mouth daily.   ??? mirtazapine (REMERON) 30 MG tablet Take 1 tablet (30 mg total) by mouth nightly.   ??? nicotine (NICODERM CQ) 14 mg/24 hr patch Place 1 patch on the skin daily.   ??? omeprazole (PRILOSEC) 20 MG capsule TAKE 1 CAPSULE(20 MG) BY MOUTH DAILY   ??? sertraline (ZOLOFT) 100 MG tablet Take 2 tablets (200 mg total) by mouth daily.   ??? thiamine (B-1) 100 MG tablet Take 1 tablet (100 mg total) by mouth daily.     No current facility-administered medications on file prior to visit.      Allergies   Allergen Reactions   ??? Chantix [Varenicline] Other (See Comments)     CNS symptoms - anxiety, agitation   ??? Pollen Extracts Other (See Comments)     Congestion       Social History  Lives with his >70yr male partner in [place]. 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 Likes his job but wishes it paid more.  ??  Has sex with men only. Bottom (receptive) only. Gives and receives oral sex.  No personal h/o STDs -- says he was celibate for years before meeting his current partner.  ??  Past history of IV cocaine use in the late 1990s, but nothing since.  Uses MJ occasionally. Heavy ETOH (3/4 of a glass of vodka/day, decreased from prior visits). Ongoing smoking 1/2ppd.  Social History     Tobacco Use   ??? Smoking status: Current Every Day Smoker     Packs/day: 1.00     Start date: 05/02/1973   ??? Smokeless tobacco: Never Used   Substance Use Topics   ??? Alcohol use: Yes     Alcohol/week: 40.0 standard drinks     Types: 40 Shots of liquor per week     Comment: 1/2 gallon of vodka per week     Review of Systems  As per HPI. Remainder of 10 systems reviewed, negative.      Objective:      No physical exam as this was a video visit.     Laboratory Data  Reviewed in Epic today, using Synopsis and Chart Review filters.    Lab Results   Component Value Date    CREATININE 1.39 (H) 10/02/2018    QFTTBGOLD Negative 03/24/2016    HEPCAB Nonreactive 11/09/2016    HCVRNA Not Detected 10/01/2015    CHOL 222 (H) 10/02/2018    HDL 64 (H) 10/02/2018    LDL  10/02/2018      Comment:      Unable to calculate due to triglyceride greater than 400 mg/dL.    NONHDL 158 10/02/2018    TRIG 498 (H) 10/02/2018    PSA 0.90 05/10/2017    A1C 5.4 11/09/2016    FINALDX  07/07/2017     A.  Colon, transverse, biopsy  - Hyperplastic polyp (multiple fragments)    B.  Colon, descending,  biopsy  - Hyperplastic polyp (multiple fragments)    C.  Colon, sigmoid, biopsy  - Hyperplastic polyp (multiple fragments)    D.  Colon, sigmoid #2, biopsy  - Hyperplastic polyp (multiple fragments)    E.  Colon, rectum, biopsy  - Hyperplastic polyp (multiple fragments)

## 2019-04-03 MED ORDER — LISINOPRIL 40 MG TABLET
ORAL_TABLET | Freq: Every day | ORAL | 2 refills | 90.00000 days | Status: CP
Start: 2019-04-03 — End: 2019-12-29

## 2019-04-03 MED ORDER — ASPIRIN 81 MG TABLET,DELAYED RELEASE
Freq: Every day | ORAL | 2 refills | 90 days | Status: CP
Start: 2019-04-03 — End: ?

## 2019-04-03 MED ORDER — BIKTARVY 50 MG-200 MG-25 MG TABLET
ORAL_TABLET | Freq: Every day | ORAL | 5 refills | 90.00000 days | Status: CP
Start: 2019-04-03 — End: ?

## 2019-04-03 MED ORDER — GABAPENTIN 600 MG TABLET
ORAL_TABLET | Freq: Three times a day (TID) | ORAL | 5 refills | 30.00000 days | Status: CP
Start: 2019-04-03 — End: ?

## 2019-04-03 MED ORDER — HYDROCHLOROTHIAZIDE 25 MG TABLET
ORAL_TABLET | Freq: Every day | ORAL | 2 refills | 90.00000 days | Status: CP
Start: 2019-04-03 — End: ?

## 2019-04-03 MED ORDER — MIRTAZAPINE 30 MG TABLET
ORAL_TABLET | Freq: Every evening | ORAL | 5 refills | 30 days | Status: CP
Start: 2019-04-03 — End: ?

## 2019-04-03 MED ORDER — NICOTINE 14 MG/24 HR DAILY TRANSDERMAL PATCH
MEDICATED_PATCH | TRANSDERMAL | 2 refills | 28 days | Status: CP
Start: 2019-04-03 — End: ?

## 2019-04-03 MED ORDER — ATORVASTATIN 40 MG TABLET
ORAL_TABLET | Freq: Every day | ORAL | 3 refills | 90 days | Status: CP
Start: 2019-04-03 — End: 2020-04-02

## 2019-04-17 ENCOUNTER — Ambulatory Visit: Admit: 2019-04-17 | Payer: PRIVATE HEALTH INSURANCE

## 2019-04-17 NOTE — Unmapped (Signed)
RD called pt in regards to hygiene and food bags based on FPL%, RW eligibility and in-person clinic visit on 04/18/2019. RD left VM for pt to call back clinic to be assessed by RD or SW.    Unk Pinto, MS, RD, LDN

## 2019-04-18 NOTE — Unmapped (Signed)
Hello,    I called the patient left a voice mail to call back and reschedule.      Thanks

## 2019-05-02 ENCOUNTER — Encounter: Admit: 2019-05-02 | Discharge: 2019-05-02 | Payer: PRIVATE HEALTH INSURANCE

## 2019-05-02 DIAGNOSIS — B20 Human immunodeficiency virus [HIV] disease: Secondary | ICD-10-CM | POA: Diagnosis not present

## 2019-05-02 DIAGNOSIS — M48061 Spinal stenosis, lumbar region without neurogenic claudication: Secondary | ICD-10-CM | POA: Diagnosis not present

## 2019-05-02 DIAGNOSIS — E782 Mixed hyperlipidemia: Secondary | ICD-10-CM | POA: Diagnosis not present

## 2019-05-02 DIAGNOSIS — F1010 Alcohol abuse, uncomplicated: Secondary | ICD-10-CM | POA: Diagnosis not present

## 2019-05-02 DIAGNOSIS — I10 Essential (primary) hypertension: Secondary | ICD-10-CM | POA: Diagnosis not present

## 2019-05-02 DIAGNOSIS — E785 Hyperlipidemia, unspecified: Secondary | ICD-10-CM | POA: Diagnosis not present

## 2019-05-02 DIAGNOSIS — M5116 Intervertebral disc disorders with radiculopathy, lumbar region: Secondary | ICD-10-CM | POA: Diagnosis not present

## 2019-05-02 DIAGNOSIS — Z7902 Long term (current) use of antithrombotics/antiplatelets: Secondary | ICD-10-CM | POA: Diagnosis not present

## 2019-05-02 DIAGNOSIS — F17210 Nicotine dependence, cigarettes, uncomplicated: Secondary | ICD-10-CM | POA: Diagnosis not present

## 2019-05-02 DIAGNOSIS — Z23 Encounter for immunization: Secondary | ICD-10-CM | POA: Diagnosis not present

## 2019-05-02 DIAGNOSIS — Z8673 Personal history of transient ischemic attack (TIA), and cerebral infarction without residual deficits: Secondary | ICD-10-CM | POA: Diagnosis not present

## 2019-05-02 DIAGNOSIS — Z79899 Other long term (current) drug therapy: Secondary | ICD-10-CM | POA: Diagnosis not present

## 2019-05-02 DIAGNOSIS — F329 Major depressive disorder, single episode, unspecified: Secondary | ICD-10-CM | POA: Diagnosis not present

## 2019-05-02 DIAGNOSIS — R600 Localized edema: Secondary | ICD-10-CM | POA: Diagnosis not present

## 2019-05-02 DIAGNOSIS — Z1159 Encounter for screening for other viral diseases: Secondary | ICD-10-CM

## 2019-05-02 DIAGNOSIS — M545 Low back pain: Secondary | ICD-10-CM

## 2019-05-02 DIAGNOSIS — M7989 Other specified soft tissue disorders: Secondary | ICD-10-CM

## 2019-05-02 DIAGNOSIS — M5442 Lumbago with sciatica, left side: Secondary | ICD-10-CM

## 2019-05-02 DIAGNOSIS — Z720 Tobacco use: Secondary | ICD-10-CM

## 2019-05-02 DIAGNOSIS — M5441 Lumbago with sciatica, right side: Secondary | ICD-10-CM

## 2019-05-02 LAB — URINALYSIS
BACTERIA: NONE SEEN /HPF
BILIRUBIN UA: NEGATIVE
BLOOD UA: NEGATIVE
GLUCOSE UA: NEGATIVE
HYALINE CASTS: 1 /LPF (ref 0–1)
KETONES UA: NEGATIVE
NITRITE UA: NEGATIVE
PH UA: 5 (ref 5.0–9.0)
PROTEIN UA: NEGATIVE
RBC UA: <1 /HPF (ref <=3)
SPECIFIC GRAVITY UA: 1.023 (ref 1.003–1.030)
SQUAMOUS EPITHELIAL: <1 /HPF (ref 0–5)
UROBILINOGEN UA: 0.2
WBC UA: 2 /HPF (ref <=2)

## 2019-05-02 LAB — COMPREHENSIVE METABOLIC PANEL
ALBUMIN: 5 g/dL (ref 3.5–5.0)
ALKALINE PHOSPHATASE: 93 U/L (ref 38–126)
ALT (SGPT): 50 U/L — ABNORMAL HIGH (ref <50)
ANION GAP: 14 mmol/L (ref 7–15)
AST (SGOT): 63 U/L — ABNORMAL HIGH (ref 19–55)
BILIRUBIN TOTAL: 0.8 mg/dL (ref 0.0–1.2)
BLOOD UREA NITROGEN: 21 mg/dL (ref 7–21)
BUN / CREAT RATIO: 18
CALCIUM: 10 mg/dL (ref 8.5–10.2)
CO2: 22 mmol/L (ref 22.0–30.0)
CREATININE: 1.19 mg/dL (ref 0.70–1.30)
EGFR CKD-EPI AA MALE: 77 mL/min/{1.73_m2} (ref >=60)
EGFR CKD-EPI NON-AA MALE: 66 mL/min/{1.73_m2} (ref >=60)
GLUCOSE RANDOM: 98 mg/dL (ref 70–179)
POTASSIUM: 4.3 mmol/L (ref 3.5–5.0)
PROTEIN TOTAL: 8.4 g/dL — ABNORMAL HIGH (ref 6.5–8.3)
SODIUM: 137 mmol/L (ref 135–145)

## 2019-05-02 LAB — CBC W/ AUTO DIFF
BASOPHILS ABSOLUTE COUNT: 0 10*9/L (ref 0.0–0.1)
BASOPHILS RELATIVE PERCENT: 0.7 %
EOSINOPHILS ABSOLUTE COUNT: 0.1 10*9/L (ref 0.0–0.4)
EOSINOPHILS RELATIVE PERCENT: 1.9 %
HEMATOCRIT: 45.6 % (ref 41.0–53.0)
HEMOGLOBIN: 14.4 g/dL (ref 13.5–17.5)
LARGE UNSTAINED CELLS: 2 % (ref 0–4)
LYMPHOCYTES ABSOLUTE COUNT: 1.3 10*9/L — ABNORMAL LOW (ref 1.5–5.0)
LYMPHOCYTES RELATIVE PERCENT: 22.3 %
MEAN CORPUSCULAR VOLUME: 97.9 fL (ref 80.0–100.0)
MEAN PLATELET VOLUME: 10.6 fL — ABNORMAL HIGH (ref 7.0–10.0)
MONOCYTES ABSOLUTE COUNT: 0.4 10*9/L (ref 0.2–0.8)
MONOCYTES RELATIVE PERCENT: 6.6 %
NEUTROPHILS ABSOLUTE COUNT: 4 10*9/L (ref 2.0–7.5)
NEUTROPHILS RELATIVE PERCENT: 66.6 %
RED BLOOD CELL COUNT: 4.66 10*12/L (ref 4.50–5.90)
RED CELL DISTRIBUTION WIDTH: 14.5 % (ref 12.0–15.0)
WBC ADJUSTED: 5.9 10*9/L (ref 4.5–11.0)

## 2019-05-02 LAB — RED CELL DISTRIBUTION WIDTH: Lab: 14.5

## 2019-05-02 LAB — PROTEIN / CREATININE RATIO, URINE: PROTEIN/CREAT RATIO, URINE: 0.067

## 2019-05-02 LAB — SPECIFIC GRAVITY UA: Specific gravity:Rden:Pt:Urine:Qn:: 1.023

## 2019-05-02 LAB — LIPID PANEL
CHOLESTEROL/HDL RATIO SCREEN: 3.1 (ref <5.0)
LDL CHOLESTEROL CALCULATED: 42 mg/dL — ABNORMAL LOW (ref 60–99)
NON-HDL CHOLESTEROL: 118 mg/dL
TRIGLYCERIDES: 382 mg/dL — ABNORMAL HIGH (ref 1–149)
VLDL CHOLESTEROL CAL: 76.4 mg/dL — ABNORMAL HIGH (ref 12–47)

## 2019-05-02 LAB — CHOLESTEROL: Cholesterol:MCnc:Pt:Ser/Plas:Qn:: 174

## 2019-05-02 LAB — THYROID STIMULATING HORMONE: Thyrotropin:ACnc:Pt:Ser/Plas:Qn:: 0.903

## 2019-05-02 LAB — PROTEIN URINE: Protein:MCnc:Pt:Urine:Qn:: 12.2

## 2019-05-02 LAB — CO2: Carbon dioxide:SCnc:Pt:Ser/Plas:Qn:: 22

## 2019-05-02 MED ORDER — GABAPENTIN 600 MG TABLET
ORAL_TABLET | Freq: Three times a day (TID) | ORAL | 2 refills | 20 days | Status: CP
Start: 2019-05-02 — End: 2019-07-01

## 2019-05-02 MED ORDER — NICOTINE 21 MG/24 HR DAILY TRANSDERMAL PATCH
MEDICATED_PATCH | TRANSDERMAL | 2 refills | 28 days | Status: CP
Start: 2019-05-02 — End: ?

## 2019-05-02 NOTE — Unmapped (Signed)
Pt sent me an email asking about an insurance bill statement he received, asking if he needed to pay it.     Sent pt the following message:     Hello Mr. Larry Montes,    It looks like you still have not done the [facility] summer renewal application. I have attached the application to the email. Please return asap as your coverage is due to end 05/08/19.     Regarding the bill, the program can pay the bill once your application has been processed and are re-enrolled onto the program. The application is late so the bill will be paid late.     If you want to go ahead and pay the bill you can, or you can wait for the program to pay it for you once you???ve been approved.     Sherene Sires    Time Duration of intervention in minutes: 5 mins

## 2019-05-02 NOTE — Unmapped (Signed)
It was great to see you today.    - Continue Biktarvy every day.  - For your nerve pain, you can take 900mg  of Gabapentin three times a day.  -I have referred you to [facility] in [place]-You can call them at [phone] to schedule an appointment.  - I have ordered a MRI. I will ask Darrell Jewel in our clinic to help coordinate this for you.  - If you have any worsening pain, weakness in your legs or loss of control over your bladder or bowels you need to go right to the ER.  - Labs today.  - Follow-up in 3 months.     Please note:  New Clinic Location at [facility]: [address] , [address] [place], [place] -  [address]    Contacting us   During working hours  [phone]  After hours or weekends [phone] and ask for the ID doctor on call  Fax number   [phone]    MEDICATIONS  For refills, please contact your pharmacy and ask them to electronically send or fax the request to the clinic.     Please bring all medications in original bottles to every appointment.    [employer] (formerly [employer]) or [employer] Eligibility (required even if you do not receive medication through [facility])  Please remember to renew your Juanell Fairly eligibility during renewal periods which occur twice a year: January-March and July-September.     The following are needed for each renewal:   - [facility] Identification (if you don't have one, then a bill with your name and address in [place])   - proof of income (award letter, W-2, or last three check stubs)   If you are unable to come in for renewal, let us know if we can mail, fax or e-mail paperwork to you.   [facility] Contact: [phone]      Urgent Care Clinic  Monday, Tuesday, and Thursday from 8:30 - 12 noon  Please call ahead to speak with the nursing staff if you think you need to be seen urgently!    Lab info:  Your most recent CD4 T-cell counts and viral loads are below. Here are a few things to keep in mind when looking at your numbers:  ?? For most people, we're checking CD4 counts fairly infrequently (once a year or less)  ?? It's normal for your CD4 count to be different from visit to visit.   ?? We consider your viral load to be undetectable if it says <40 or if it says Not detected.  ?? Our goal is to get your virus to be undetectable and keep it undetectable. You can help by taking your medications at about the same time, every single day. If you're having trouble with taking your medications, it's important to let us know.    YOUR RECENT LAB RESULTS:  CD4 and VIRAL LOAD  Lab Results   Component Value Date    ACD4 889 05/07/2018    HIVRS Not Detected 10/02/2018

## 2019-05-02 NOTE — Unmapped (Signed)
Assessment/Plan:      Larry Montes, a 58 y.o. male seen today for routine HIV follow up.    Plan:    Larry Montes is a 58 yo man with a history of well controlled HIV, tobacco use disorder, daily alcohol use, TIA, HLD, HTN, depression here for follow-up. He does not have a primary care provider at this time, but was referred to [facility] today. He is interested in continuing to work on decreasing his tobacco and alcohol use and agrees to be referred to substance use services through [facility].    HIV  - He is doing very well from an HIV perspective. He has been on Biktarvy since 09/2017.  He is taking Biktarvy daily without any recent missed doses.   ?? Continue current therapy. E-prescribed today.  ?? Checking CD4, HIV RNA, & safety labs (full return). Will present for in person visit in 2 weeks- can obtain labs at that visit.   ?? Encouraged continued excellent ARV adherence.  ?? No need for OI prophylaxis as CD4= 889  We reviewed how to take medications and importance of NOT intermittently interrupting medications.  ?? We discussed importance of separating administration of integrase inhibitor from dietary supplements and antacids.  ?? CD4 counts have been stably over 300 for >2Y on suppressive ART, so rechecks will now be annual (per [place]-DHHS guidelines).  Lab Results   Component Value Date    ACD4 889 05/07/2018    CD4 57 05/07/2018    HIVCP <40 (H) 05/07/2018    HIVRS Not Detected 10/02/2018     Essential hypertension  - Well controlled on Lisinopril and HCTZ.  - Continue current regimen.  - Referred to PCP today.    Low back pain with radiculopathy  - Longstanding history of bilateral low back and buttock pain with radiculopathy.  Last spinal imaging I can see in our system was a MRI from 2010 with spinal stenosis and multilevel DDD. He reports worsening of pain with bilateral leg numbness after walking/standing for short periods of time. No fevers, chills, myalgias, weakness, loss of bowel or bladder control.   - MRI lumbar spine w wo contrast to further evaluate.  - Referral to orthopedic surgery placed.  - We discussed ER precautions should symptoms worsen.    Bilateral LE Edema  -2 month history of intermittent bilateral edema with minimal improvement with elevation. On exam, trace edema bilaterally without warmth, erythema, tenderness. Low suspicion for DVT (Wells= -2). He denies symptoms suggestive of CHF. Most likely venous insufficiency, although he has a significant history of alcohol use as well as recent increase in creatinine so this could be related to renal or hepatic disease.  - Will start by obtaining additional labs including CMP, UA, TSH.  - Consider echocardiogram to evaluate for CHF or pulmonary hypertension if above work-up unrevealing.     H/o TIA:  - On Plavix since 04/2016 per [facility] neurology. Recently stopped Plavix on his own but has restarted aspirin.   -Follow-up with neurology to discuss recommendations.    Neuropathy  - He continues to experience neuropathy in his bilateral feet. He did not increase from 600mg  to 900mg  per our last visit.  - Advised to increase Gabapentin to 900mg  TID.    - We discussed importance of decreasing alcohol use, as this is likely a major contributor to his symptoms.    Hyperlipidemia, unspecified hyperlipidemia type  - Changed from Pravastatin to Atorvastatin 09/2018. Currently taking Atorvastatin 40mg  daily  and tolerating it well. Significantly elevated triglycerides (~500) in the past.   - Will recheck lipids today.     Alcohol abuse, daily use  - He reports drinking 1/2 gallon (~42 servings) of Vodka every  4 days, which has decreased by 50% compared to previous consumption.   - Not taking thiamine 100mg  qd and OTC multivitamin once daily, but will start.  - Agrees to referral to [facility]. He is interested in total cessation and would like to try naltrexone.    Mental Health  - History of depression, and has been on Zoloft and Remeron for a number of years.   - Mood is stable; feels medication is working well.   - Continue current regimen.     Tobacco use-    - Started smoking at age 43, smoked 1 ppd for most of his life, now at 15-20 cpd.  He has been using the nicotine patch and requests refills. He feels 21mg  patch helps with nicotine and alcohol cravings.  -His partner, who he lives with, also smokes, which has made it difficult for Larry Fickle to quit.   -  He tried Chantix in the past but this caused psychiatric side effects.  - Currently on 14mg  patch- will increase back to 21mg  patch.  - Interested in referral to Tobacco Treatment Program- placed today.  - Lung Cancer screening 04/2018 with: Emphysema. Subcentimeter bilateral solid and groundglass nodules measuring up to 3 mm. Lung-RADS Category: 2 Follow-up Recommendation: 1 year- Due for LDCT.    Right posterior parotid mass: -??Not addressed today.  Found incidentally on OSH MRI. Pt says has been there for years and is unchanged.    Sexual health & secondary prevention  Sex with men. Monogamous with single partner. Declines STI testing today.     Lab Results   Component Value Date    RPR Nonreactive 10/02/2018    CTNAA Negative 05/10/2017    GCNAA Negative 05/10/2017    SPECTYPE Swab 05/10/2017    SPECSOURCE Rectal-Centre Only 05/10/2017     Health maintenance  Lab Results   Component Value Date    CREATININE 1.19 05/02/2019    QFTTBGOLD Negative 03/24/2016    HEPCAB Nonreactive 11/09/2016    HCVRNA Not Detected 10/01/2015    CHOL 174 05/02/2019    HDL 56 05/02/2019    LDL 42 (L) 05/02/2019    NONHDL 118 05/02/2019    TRIG 382 (H) 05/02/2019    PSA 0.90 05/10/2017    A1C 5.4 11/09/2016    FINALDX  07/07/2017     A.  Colon, transverse, biopsy  - Hyperplastic polyp (multiple fragments)    B.  Colon, descending, biopsy  - Hyperplastic polyp (multiple fragments)    C.  Colon, sigmoid, biopsy  - Hyperplastic polyp (multiple fragments)    D.  Colon, sigmoid #2, biopsy  - Hyperplastic polyp (multiple fragments)    E.  Colon, rectum, biopsy  - Hyperplastic polyp (multiple fragments)         Communicable diseases  # TB - no longer needed; negative 03/24/2016 and low/no risk  # HCV - negative 11/09/2016; rescreen w/Ab q1-2y    Cancer screening  # Anorectal - neg 05/2017- Overdue  # Colorectal - Numerous colon polyps on 05/2017 c-scope. Per Dr. Fara Boros, 10 year follow-up recommended  # Liver - not applicable  # Lung - neg 04/2018. Repeat in 12 months- Overdue    Immunization History   Administered Date(s) Administered   ???  Hepatitis A 04/21/2010, 12/29/2010   ??? Hepatitis B, Adult 07/08/2009, 11/04/2009, 04/21/2010, 05/10/2017   ??? INFLUENZA TIV (TRI) PF (IM) 07/08/2009, 04/21/2010, 06/23/2011   ??? Influenza Vaccine Quad (IIV4 PF) 16mo+ injectable 06/28/2012, 05/02/2013, 08/28/2014, 10/01/2015, 05/05/2016, 05/10/2017, 05/07/2018, 05/02/2019   ??? PNEUMOCOCCAL POLYSACCHARIDE 23 02/19/2009, 02/26/2015   ??? PPD Test 11/04/2009, 12/29/2010   ??? Pneumococcal Conjugate 13-Valent 03/27/2014   ??? TdaP 02/19/2009     ?? Screening ordered today: low-dose CT chest and lipid panel  ?? Immunizations ordered today: influenza    Counseling services took 15 minutes of today's visit time.  Counseled as documented above regarding decrease in substance use, medication adherence, weight management and healthy lifestyle choices and need for recommended screening tests.    Next Visit:  - Follow-up MRI and Ortho Referral  - Established PCP?  - Reassess alcohol/tobacco use  - Anal pap  - LDCT completed for lung cancer screening?    Disposition  Return to clinic 3-4 months or sooner if needed.    Lahoma Rocker, PA-C  [facility]   [address] 1st floor   [place], [place]. [address]   Phone: [phone]   Fax: [phone]     Subjective:      Chief Complaint   HIV followup    HPI  Return patient visit for Larry Montes, a 58 y.o. male.     See Assessment & Plan for additional details    Larry Montes main concern today is his low back pain.  He reports longstanding history of low back pain which is now more in his bilateral buttocks and radiates down both legs.  He reports legs will become numb if he stands for more than 10 minutes or walks for long periods of time.  He denies any lower extremity weakness, or loss of bowel or bladder control.  The pain has been going on for years, but he feels it is worsened in the last couple of months.  No recent injury or fall.  -He has bilateral foot swelling over the last few months.  He reports it is achy when he walks for long periods of time.  He does not think he gets better overnight when his legs are elevated, as he wears socks to bed and he can see the sock lines when he wakes up.  He denies any warmth, redness, tenderness.  No recent fevers, chills, cough, shortness of breath, orthopnea, PND, fatigue, other edema.  -He is taking Biktarvy daily as scheduled.  No recent missed doses.  -Taking 600 mg gabapentin but reports continued neuropathy in feet.  -He is drinking around half a gallon of vodka every 4 days.  Prior 1/2 gallon would last only 2 days.  He is trying to cut down and is interested in stopping entirely.  He denies any withdrawal symptoms at this time.    Past Medical History:   Diagnosis Date   ??? Alcohol abuse     Hospitalized in the past - has been able to abstain for periods of time   ??? Depression    ??? HIV disease (CMS-HCC)    ??? Hyperlipidemia    ??? Hypertension      Medications and Allergies   Reviewed and updated today. See bottom of this visit's encounter summary for details.  Current Outpatient Medications on File Prior to Visit   Medication Sig   ??? aspirin (ECOTRIN) 81 MG tablet Take 1 tablet (81 mg total) by mouth daily.   ???  atorvastatin (LIPITOR) 40 MG tablet Take 1 tablet (40 mg total) by mouth daily.   ??? bictegrav-emtricit-tenofov ala (BIKTARVY) 50-200-25 mg tablet Take 1 tablet by mouth daily.   ??? hydroCHLOROthiazide (HYDRODIURIL) 25 MG tablet Take 1 tablet (25 mg total) by mouth daily. TAKE 1 TABLET(25 MG) BY MOUTH DAILY   ??? lisinopriL (PRINIVIL,ZESTRIL) 40 MG tablet Take 1 tablet (40 mg total) by mouth daily.   ??? mirtazapine (REMERON) 30 MG tablet Take 1 tablet (30 mg total) by mouth nightly.   ??? omeprazole (PRILOSEC) 20 MG capsule TAKE 1 CAPSULE(20 MG) BY MOUTH DAILY   ??? sertraline (ZOLOFT) 100 MG tablet Take 2 tablets (200 mg total) by mouth daily.   ??? thiamine (B-1) 100 MG tablet Take 1 tablet (100 mg total) by mouth daily.   ??? clopidogrel (PLAVIX) 75 mg tablet Take 75 mg by mouth.     No current facility-administered medications on file prior to visit.      Allergies   Allergen Reactions   ??? Chantix [Varenicline] Other (See Comments)     CNS symptoms - anxiety, agitation   ??? Pollen Extracts Other (See Comments)     Congestion       Social History  Lives with his >24yr male partner in [place]. 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 Likes his job but wishes it paid more.  ??  Has sex with men only. Bottom (receptive) only. Gives and receives oral sex.  No personal h/o STDs -- says he was celibate for years before meeting his current partner.  ??  Past history of IV cocaine use in the late 1990s, but nothing since.  Uses MJ occasionally. Heavy ETOH (10 shots per day, decreased from prior visits). Ongoing smoking 3/4 ppd.  Social History     Tobacco Use   ??? Smoking status: Current Every Day Smoker     Packs/day: 1.00     Start date: 05/02/1973   ??? Smokeless tobacco: Never Used   Substance Use Topics   ??? Alcohol use: Yes     Alcohol/week: 40.0 standard drinks     Types: 40 Shots of liquor per week     Comment: 1/2 gallon of vodka per week     Review of Systems  As per HPI. Remainder of 10 systems reviewed, negative.      Objective:      Physical Exam   Constitutional: He is oriented to person, place, and time and well-developed, well-nourished, and in no distress. No distress.   Neck: Normal range of motion. Neck supple.   Cardiovascular: Normal rate, regular rhythm and normal heart sounds.   Pulses:       Dorsalis pedis pulses are 2+ on the right side and 2+ on the left side.   Pulmonary/Chest: Effort normal and breath sounds normal.   Abdominal: Soft. Bowel sounds are normal. He exhibits distension. There is no abdominal tenderness. There is no rebound and no guarding.   Musculoskeletal: Normal range of motion.      Comments: Trace edema bilaterally without warmth, tenderness, erythema. Distal pulses intact.   Neurological: He is alert and oriented to person, place, and time.   Skin: Skin is warm and dry. He is not diaphoretic.   Psychiatric: Mood, memory, affect and judgment normal.     Laboratory Data  Reviewed in Epic today, using Synopsis and Chart Review filters.    Lab Results   Component Value Date  CREATININE 1.19 05/02/2019    QFTTBGOLD Negative 03/24/2016    HEPCAB Nonreactive 11/09/2016    HCVRNA Not Detected 10/01/2015    CHOL 174 05/02/2019    HDL 56 05/02/2019    LDL 42 (L) 05/02/2019    NONHDL 118 05/02/2019    TRIG 382 (H) 05/02/2019    PSA 0.90 05/10/2017    A1C 5.4 11/09/2016    FINALDX  07/07/2017     A.  Colon, transverse, biopsy  - Hyperplastic polyp (multiple fragments)    B.  Colon, descending, biopsy  - Hyperplastic polyp (multiple fragments)    C.  Colon, sigmoid, biopsy  - Hyperplastic polyp (multiple fragments)    D.  Colon, sigmoid #2, biopsy  - Hyperplastic polyp (multiple fragments)    E.  Colon, rectum, biopsy  - Hyperplastic polyp (multiple fragments)

## 2019-05-03 LAB — LYMPH MARKER LIMITED,FLOW
ABSOLUTE CD4 CNT: 737 {cells}/uL (ref 510–2320)
CD3% (T CELLS)": 69 % (ref 61–86)
CD4% (T HELPER)": 52 % (ref 34–58)
CD4:CD8 RATIO: 2.9 (ref 0.9–4.8)
CD8% T SUPPRESR": 18 % (ref 12–38)

## 2019-05-03 LAB — ABSOLUTE CD4 CNT: Cells.CD3+CD4+:NCnc:Pt:XXX:Qn:: 737

## 2019-05-03 LAB — HEPATITIS C ANTIBODY: Hepatitis C virus Ab:PrThr:Pt:Ser:Ord:: NONREACTIVE

## 2019-05-06 MED ORDER — SERTRALINE 100 MG TABLET
ORAL_TABLET | 5 refills | 0 days | Status: CP
Start: 2019-05-06 — End: ?

## 2019-05-06 NOTE — Unmapped (Signed)
Med Refill: Sertraline 100 mg   Last visit: 05/02/2019  Upcoming visit:08/06/2019

## 2019-05-06 NOTE — Unmapped (Signed)
[facility]) clinic  Dept of Psychiatry  Documentation of Referral request     Date of Referral: 05/03/19  Status of Referral: accepted:   Intake date, time and provider:  Marianna Payment at 8am Thursday 10/1  Reason for referral: medication eval for AUD  Referring provider: Lahoma Rocker PA, Infectious Disease     Pre-Intake Assessment  [facility]  Department of Psychiatry    Pt is scheduled for an intake assessment with a [facility] provider for treatment of their substance use disorder.  This patient was contacted prior to the intake and the following were reviewed/discussed:    Review of clinic operations:    Review of [facility] model and that treatment in [facility] is short term: Yes.  Patients will meet with their [facility] provider weekly: Yes.  Patients will be required to come to clinic in-person for tox screens: Yes.  Pharmacy assistance called and approved: n/a  Pharmacy updated in chart: Yes.  Verified phone number and video capability: Yes.  Reviewed video appointments: Yes.    Ray Church, MSW

## 2019-05-07 LAB — HIV RNA, QUANTITATIVE, PCR

## 2019-05-07 LAB — HIV RNA LOG(10): Lab: 0

## 2019-05-09 ENCOUNTER — Telehealth: Admit: 2019-05-09 | Discharge: 2019-05-10 | Payer: PRIVATE HEALTH INSURANCE

## 2019-05-09 DIAGNOSIS — F1020 Alcohol dependence, uncomplicated: Secondary | ICD-10-CM

## 2019-05-09 DIAGNOSIS — F1010 Alcohol abuse, uncomplicated: Secondary | ICD-10-CM

## 2019-05-09 DIAGNOSIS — F329 Major depressive disorder, single episode, unspecified: Secondary | ICD-10-CM

## 2019-05-09 DIAGNOSIS — F4310 Post-traumatic stress disorder, unspecified: Secondary | ICD-10-CM

## 2019-05-09 DIAGNOSIS — F419 Anxiety disorder, unspecified: Secondary | ICD-10-CM

## 2019-05-09 MED ORDER — NALTREXONE 50 MG TABLET
ORAL_TABLET | Freq: Every day | ORAL | 3 refills | 30 days | Status: CP
Start: 2019-05-09 — End: 2020-05-08
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
## 2019-05-15 ENCOUNTER — Encounter: Admit: 2019-05-15 | Discharge: 2019-05-16 | Payer: PRIVATE HEALTH INSURANCE

## 2019-05-15 DIAGNOSIS — M9953 Intervertebral disc stenosis of neural canal of lumbar region: Secondary | ICD-10-CM | POA: Diagnosis not present

## 2019-05-15 DIAGNOSIS — M9954 Intervertebral disc stenosis of neural canal of sacral region: Secondary | ICD-10-CM | POA: Diagnosis not present

## 2019-05-15 DIAGNOSIS — F1010 Alcohol abuse, uncomplicated: Secondary | ICD-10-CM | POA: Diagnosis not present

## 2019-05-15 DIAGNOSIS — M4607 Spinal enthesopathy, lumbosacral region: Secondary | ICD-10-CM | POA: Diagnosis not present

## 2019-05-15 DIAGNOSIS — M4807 Spinal stenosis, lumbosacral region: Secondary | ICD-10-CM | POA: Diagnosis not present

## 2019-05-15 DIAGNOSIS — K829 Disease of gallbladder, unspecified: Secondary | ICD-10-CM | POA: Diagnosis not present

## 2019-05-15 DIAGNOSIS — M4726 Other spondylosis with radiculopathy, lumbar region: Secondary | ICD-10-CM | POA: Diagnosis not present

## 2019-05-15 DIAGNOSIS — R160 Hepatomegaly, not elsewhere classified: Secondary | ICD-10-CM | POA: Diagnosis not present

## 2019-05-15 DIAGNOSIS — M48061 Spinal stenosis, lumbar region without neurogenic claudication: Secondary | ICD-10-CM | POA: Diagnosis not present

## 2019-05-15 DIAGNOSIS — R937 Abnormal findings on diagnostic imaging of other parts of musculoskeletal system: Secondary | ICD-10-CM | POA: Diagnosis not present

## 2019-05-15 DIAGNOSIS — B20 Human immunodeficiency virus [HIV] disease: Secondary | ICD-10-CM | POA: Diagnosis not present

## 2019-05-15 DIAGNOSIS — K828 Other specified diseases of gallbladder: Secondary | ICD-10-CM | POA: Diagnosis not present

## 2019-05-15 DIAGNOSIS — M545 Low back pain: Secondary | ICD-10-CM | POA: Diagnosis not present

## 2019-05-17 NOTE — Unmapped (Signed)
A financial assessment was completed to determine eligibility for patient financial assistance programs.   Completed assessment 05/07/2019    Household FPL: 215%  Individual FPL: 142%    Insurance  Patient has a Location manager through the [employer] ([email or web address]).    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 RW flag was set based on IPL for Caps on Charges.     HMAP/PCAP (Premium Copay Assistance program) eligibility:   RENEWAL APPLICATION. Patient has coverage with the PCAP sub-program for the current period and is eligible for medication assistance. The renewal application is pending review for the next coverage period.    Other PCAP sub-program details:   - Patient can fill their medications at any pharmacy within the health insurance plan pharmacy network.   -If the patient's insurance premium changes, the patient must provide an insurance billing statement showing this NEW premium amount (i.e. after open enrollment between Nov. & Dec. when the patient re-enrolls in a plan for the next year).     Patient was informed about the following programs:   None need at this time    Patient was referred to the following programs:   None needed at this time    Additional Comments:    Time Duration of intervention in minutes: 15 mins

## 2019-05-18 NOTE — Unmapped (Signed)
Note reviewed and approved by Sherene Sires, Patient Benefits Coordinator.     Sherene Sires.

## 2019-05-20 NOTE — Unmapped (Signed)
Called to review recent spinal MRI and abdominal US and discuss next steps.    [place] with enlarged echogenic liver, consistent with fibrofatty changes likely related to body habitus and alcohol intake. Currently enrolled in substance use program and started on naltrexone.    Lumbar spine MRI with mod to severe neural foraminal narrowing and spinal canal stenosis as well as exuberant facet arthropathy. Referred to Orthopedic Surgery but has not scheduled. Would like to assess symptoms and ensure there is no interval worsening.

## 2019-06-04 ENCOUNTER — Ambulatory Visit: Admit: 2019-06-04 | Discharge: 2019-06-05 | Payer: PRIVATE HEALTH INSURANCE

## 2019-06-04 ENCOUNTER — Telehealth: Admit: 2019-06-04 | Discharge: 2019-06-05 | Payer: PRIVATE HEALTH INSURANCE

## 2019-06-04 DIAGNOSIS — Z720 Tobacco use: Secondary | ICD-10-CM | POA: Diagnosis not present

## 2019-06-04 DIAGNOSIS — B20 Human immunodeficiency virus [HIV] disease: Secondary | ICD-10-CM | POA: Diagnosis not present

## 2019-06-04 NOTE — Unmapped (Signed)
Assessment/Plan:      Larry Montes, a 58 y.o. male seen today for routine HIV follow up.    Plan:    Larry Montes is a 58 yo man with a history of well controlled HIV, tobacco use disorder, daily alcohol use, HLD, HTN, depression here for follow-up. He does not have a primary care provider at this time, but was referred to [facility] at his last visit. Recently connected to Alcohol and Substance Abuse Program due to alcohol use disorder.     HIV  - He is doing very well from an HIV perspective. He has been on Biktarvy since 09/2017-  taking Biktarvy daily without any recent missed doses.   ?? Continue current therapy.   ?? Encouraged continued excellent ARV adherence.  ?? VL undetectable and CD4 of 737 as of last month.   Lab Results   Component Value Date    ACD4 737 05/02/2019    CD4 52 05/02/2019    HIVCP <40 (H) 05/07/2018    HIVRS Not Detected 05/02/2019     Low back pain with radiculopathy  - Chronic low back pain with bilateral sciatica that has worsened over the past year.   - 10/7- MRI with moderate to severe bilateral neural foraminal narrowing and spinal canal stenosis, worst at the L4-S1 levels (see below for full report)  - [facility]uled [facility] on 11/3 (Dr. Lorenda Peck)    Essential hypertension  - Well controlled on Lisinopril and HCTZ.  - Continue current regimen.  - Referred to PCP at prior visit for management.    Bilateral LE Edema  - 3 month history of intermittent bilateral edema. No associated orthopnea, PND, or other symptoms c/w CHF. No history of CKD. He does have severe alcohol use disorder- recent abdominal US with fibrofatty changes but no signs of cirrhosis. Edema may be related to his excessive alcohol intake vs venous insufficiency.   - Can consider echocardiogram to evaluate for CHF or pulmonary hypertension, but patient declines at this time.  - CTM    Hyperlipidemia, unspecified hyperlipidemia type - . Currently taking Atorvastatin 40mg  daily and tolerating it well. Significantly elevated triglycerides (~500) in the past.   - Recent lipid panel (non-fasting) with: LDL- 42, HDL- 56, Total- 174, Triglycerides- 382.  - Discussed dietary/lifestyle recommendations including decreasing fats, sugars and alcohol as well as tobacco use.     Alcohol abuse, daily use  - He has recently been very committed to decreasing alcohol use. He has decreased from around 24oz of vodka daily to 12 oz over the last 3 weeks.   - Followed by [facility] clinic and started on Naltrexone 10/1.  - Congratulated on his progress and encouraged continued engagement with his sobriety.      Tobacco use-Not addressed today  - Started smoking at age 41, smoked 1 ppd for most of his life, now at 15-20 cpd.  He has been using the nicotine patch and requests refills. He feels 21mg  patch helps with nicotine and alcohol cravings.  -His partner, who he lives with, also smokes, which has made it difficult for Renae Fickle to quit.   -  He tried Chantix in the past but this caused psychiatric side effects.  - Currently on 14mg  patch- will increase back to 21mg  patch.  - Interested in referral to Tobacco Treatment Program- placed today.  - Lung Cancer screening 04/2018 with: Emphysema. Subcentimeter bilateral solid and groundglass nodules measuring up to 3 mm. Lung-RADS Category: 2  Follow-up Recommendation: 1 year- Due for LDCT.    Right posterior parotid mass: -??Not addressed today.  Found incidentally on OSH MRI. Pt says has been there for years and is unchanged.    Sexual health & secondary prevention  Sex with men. Monogamous with single partner. Declines STI testing today.     Lab Results   Component Value Date    RPR Nonreactive 10/02/2018    CTNAA Negative 05/10/2017    GCNAA Negative 05/10/2017    SPECTYPE Swab 05/10/2017    SPECSOURCE Rectal-[place] Only 05/10/2017     Health maintenance  Lab Results   Component Value Date    CREATININE 1.19 05/02/2019 QFTTBGOLD Negative 03/24/2016    HEPCAB Nonreactive 05/02/2019    HCVRNA Not Detected 10/01/2015    CHOL 174 05/02/2019    HDL 56 05/02/2019    LDL 42 (L) 05/02/2019    NONHDL 118 05/02/2019    TRIG 382 (H) 05/02/2019    PSA 0.90 05/10/2017    A1C 5.4 11/09/2016    FINALDX  07/07/2017     A.  Colon, transverse, biopsy  - Hyperplastic polyp (multiple fragments)    B.  Colon, descending, biopsy  - Hyperplastic polyp (multiple fragments)    C.  Colon, sigmoid, biopsy  - Hyperplastic polyp (multiple fragments)    D.  Colon, sigmoid #2, biopsy  - Hyperplastic polyp (multiple fragments)    E.  Colon, rectum, biopsy  - Hyperplastic polyp (multiple fragments)         Communicable diseases  # TB - no longer needed; negative 03/24/2016 and low/no risk  # HCV - negative 11/09/2016; rescreen w/Ab q1-2y    Cancer screening  # Anorectal - neg 05/2017- Overdue  # Colorectal - Numerous colon polyps on 05/2017 c-scope. Per Dr. Fara Boros, 10 year follow-up recommended  # Liver - not applicable  # Lung - neg 04/2018. Repeat in 12 months- Overdue    Immunization History   Administered Date(s) Administered   ??? Hepatitis A 04/21/2010, 12/29/2010   ??? Hepatitis B, Adult 07/08/2009, 11/04/2009, 04/21/2010, 05/10/2017   ??? INFLUENZA TIV (TRI) PF (IM) 07/08/2009, 04/21/2010, 06/23/2011   ??? Influenza Vaccine Quad (IIV4 PF) 86mo+ injectable 06/28/2012, 05/02/2013, 08/28/2014, 10/01/2015, 05/05/2016, 05/10/2017, 05/07/2018, 05/02/2019   ??? PNEUMOCOCCAL POLYSACCHARIDE 23 02/19/2009, 02/26/2015   ??? PPD Test 11/04/2009, 12/29/2010   ??? Pneumococcal Conjugate 13-Valent 03/27/2014   ??? TdaP 02/19/2009       Counseling services took 10 minutes of today's visit time.  Counseled as documented above regarding medication adherence and weight management and healthy lifestyle choices.    Next Visit:  - Follow-up MRI and Ortho Referral  - Established PCP?  - Reassess alcohol/tobacco use  - Anal pap  - LDCT completed for lung cancer screening?    Disposition Return to clinic 3-4 months or sooner if needed.    Lahoma Rocker, PA-C  [facility]   [address] 1st floor   [place], [place]. [address]   Phone: [phone]   Fax: [phone]     Subjective:      Chief Complaint   HIV followup    HPI  Return patient visit for Larry Montes, a 58 y.o. male.     See Assessment & Plan for additional details    - Since last visit, doing okay overall although anxiety increased over pending move. 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 He is excited about this overall but stressed.   -  Taking Biktarvy daily- never misses a dose.  - Has decreased his alcohol by around 50%. Yesterday didn't finish his first glass of Vodka. Working with [facility] and now on Naltrexone.  - Noticed smoking has also decreased.  - Back pain and leg numbness continues but has not progressed.     Past Medical History:   Diagnosis Date   ??? Alcohol abuse     Hospitalized in the past - has been able to abstain for periods of time   ??? Depression    ??? HIV disease (CMS-HCC)    ??? Hyperlipidemia    ??? Hypertension      Medications and Allergies   Reviewed and updated today. See bottom of this visit's encounter summary for details.  Current Outpatient Medications on File Prior to Visit   Medication Sig   ??? aspirin (ECOTRIN) 81 MG tablet Take 1 tablet (81 mg total) by mouth daily.   ??? atorvastatin (LIPITOR) 40 MG tablet Take 1 tablet (40 mg total) by mouth daily.   ??? bictegrav-emtricit-tenofov ala (BIKTARVY) 50-200-25 mg tablet Take 1 tablet by mouth daily.   ??? clopidogrel (PLAVIX) 75 mg tablet Take 75 mg by mouth.   ??? gabapentin (NEURONTIN) 600 MG tablet Take 1.5 tablets (900 mg total) by mouth Three (3) times a day.   ??? hydroCHLOROthiazide (HYDRODIURIL) 25 MG tablet Take 1 tablet (25 mg total) by mouth daily. TAKE 1 TABLET(25 MG) BY MOUTH DAILY   ??? lisinopriL (PRINIVIL,ZESTRIL) 40 MG tablet Take 1 tablet (40 mg total) by mouth daily. ??? mirtazapine (REMERON) 30 MG tablet Take 1 tablet (30 mg total) by mouth nightly.   ??? naltrexone (DEPADE) 50 mg tablet Take 1 tablet (50 mg total) by mouth daily.   ??? nicotine (NICODERM CQ) 21 mg/24 hr patch Place 1 patch on the skin daily.   ??? omeprazole (PRILOSEC) 20 MG capsule TAKE 1 CAPSULE(20 MG) BY MOUTH DAILY   ??? sertraline (ZOLOFT) 100 MG tablet TAKE 2 TABLETS(200 MG) BY MOUTH DAILY   ??? thiamine (B-1) 100 MG tablet Take 1 tablet (100 mg total) by mouth daily.     No current facility-administered medications on file prior to visit.      Allergies   Allergen Reactions   ??? Chantix [Varenicline] Other (See Comments)     CNS symptoms - anxiety, agitation   ??? Pollen Extracts Other (See Comments)     Congestion       Social History  Lives with his >12yr male partner in [place]. 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 Likes his job but wishes it paid more.  ??  Has sex with men only. Bottom (receptive) only. Gives and receives oral sex.  No personal h/o STDs -- says he was celibate for years before meeting his current partner.  ??  Past history of IV cocaine use in the late 1990s, but nothing since.  Uses MJ occasionally. Heavy ETOH (10 shots per day, decreased from prior visits). Ongoing smoking 3/4 ppd.  Social History     Tobacco Use   ??? Smoking status: Current Every Day Smoker     Packs/day: 1.00     Start date: 05/02/1973   ??? Smokeless tobacco: Never Used   Substance Use Topics   ??? Alcohol use: Yes     Alcohol/week: 40.0 standard drinks     Types: 40 Shots of liquor per week     Comment: 1/2 gallon of vodka per week  Review of Systems  As per HPI. Remainder of 10 systems reviewed, negative.      Objective:         Physical Exam   Constitutional: He is oriented to person, place, and time and well-developed, well-nourished, and in no distress. No distress.   Neurological: He is alert and oriented to person, place, and time.   Skin: He is not diaphoretic. Psychiatric: Mood, memory, affect and judgment normal.     Laboratory Data  Reviewed in Epic today, using Synopsis and Chart Review filters.    Lab Results   Component Value Date    CREATININE 1.19 05/02/2019    QFTTBGOLD Negative 03/24/2016    HEPCAB Nonreactive 05/02/2019    HCVRNA Not Detected 10/01/2015    CHOL 174 05/02/2019    HDL 56 05/02/2019    LDL 42 (L) 05/02/2019    NONHDL 118 05/02/2019    TRIG 382 (H) 05/02/2019    PSA 0.90 05/10/2017    A1C 5.4 11/09/2016    FINALDX  07/07/2017     A.  Colon, transverse, biopsy  - Hyperplastic polyp (multiple fragments)    B.  Colon, descending, biopsy  - Hyperplastic polyp (multiple fragments)    C.  Colon, sigmoid, biopsy  - Hyperplastic polyp (multiple fragments)    D.  Colon, sigmoid #2, biopsy  - Hyperplastic polyp (multiple fragments)    E.  Colon, rectum, biopsy  - Hyperplastic polyp (multiple fragments)           Narrative & Impression     EXAM: Magnetic resonance imaging, lumbar spine without and with contrast.  DATE: 05/15/2019 2:42 PM  ACCESSION: [id number]  DICTATED: 05/15/2019 2:46 PM  INTERPRETATION LOCATION: [facility]  ??  CLINICAL INDICATION: 58 years old Male with low back pain with radiculopathy  - M54.5 - Low back pain radiating to both legs bilateral leg numbness.  ??  COMPARISON: 12/26/2008 lumbar spine MRI.  ??  TECHNIQUE: Multiplanar MRI was performed through the lumbar spine prior to and following intravenous contrast administration.  ??  FINDINGS:   For the purposes of this dictation, the lowest well formed intervertebral disc space is assumed to be the L5-S1 level, and there are presumed to be five lumbar-type vertebral bodies.  ??  Similar 1.1 cm S1 vertebral body hemangioma. Bone marrow signal intensity is otherwise normal. There is no abnormal cord enhancement. The vertebral bodies are normally aligned. L1-L2: Diffuse disc bulge. Mild ligamentum flavum hypertrophy. Mild facet arthropathy. Mild bilateral neuroforaminal narrowing. Mild spinal canal narrowing.  ??  L2-L3: Diffuse disc bulge. Mild ligamentum flavum hypertrophy. Facet arthropathy. Mild to moderate left greater than right neuroforaminal narrowing. Mild to moderate spinal canal stenosis  ??  L3-L4: Diffuse disc bulge. Mild ligamentum flavum hypertrophy mild facet arthropathy. Moderate bilateral neuroforaminal narrowing. Moderate spinal canal stenosis Bilateral lateral recess narrowing.  ??  L4-L5: Diffuse disc bulge. Mild ligamentum flavum hypertrophy. Moderate facet arthropathy. Moderate to severe bilateral neuroforaminal narrowing. Severe spinal canal narrowing with at least moderate bilateral lateral recess narrowing.  ??  L5-S1: Diffuse disc bulge. Mild ligamentum flavum hypertrophy. Severe exuberant facet arthropathy. Moderate to severe bilateral neuroforaminal narrowing. Severe spinal canal narrowing with moderate bilateral lateral recess narrowing.  ??  There is mild T2/STIR increased signal enhancement noted around the L5-S1 bilateral facet joints likely representing inflammatory changes related to the arthropathy. The right L5-S1 facet cyst of a small effusion best seen on coronal haste images (7:7). No mass lesion or abnormal fluid collections  noted in this region.  ??  There is mild T2 hyperintensity noted within the cauda equina at the L2 and L3 levels. The visualized cord is otherwise unremarkable and the conus medullaris ends at a normal level  ??  The paraspinal tissues are within normal limits. Epidural lipomatosis is noted from L2 through L4.  ??  IMPRESSION:  ??  -Moderate to severe bilateral neural foraminal narrowing and spinal canal stenosis greatest at the L4-L5 and L5-S1 levels. -There is exuberant facet arthropathy noted at the L5-S1 level with associated T2/FLAIR increased signal and enhancement. No associated mass lesion or fluid collection noted. Findings are likely representing of inflammatory changes related to the arthropathy. Question history of recent trauma.  ??  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 Findings may represent mild cord edema related to adjacent moderate to severe spinal canal stenosis.

## 2019-06-11 ENCOUNTER — Encounter: Admit: 2019-06-11 | Discharge: 2019-06-12 | Payer: PRIVATE HEALTH INSURANCE

## 2019-06-11 DIAGNOSIS — M4807 Spinal stenosis, lumbosacral region: Secondary | ICD-10-CM | POA: Diagnosis not present

## 2019-06-11 DIAGNOSIS — F17210 Nicotine dependence, cigarettes, uncomplicated: Secondary | ICD-10-CM | POA: Diagnosis not present

## 2019-06-11 DIAGNOSIS — F329 Major depressive disorder, single episode, unspecified: Secondary | ICD-10-CM | POA: Diagnosis not present

## 2019-06-11 DIAGNOSIS — M5441 Lumbago with sciatica, right side: Secondary | ICD-10-CM | POA: Diagnosis not present

## 2019-06-11 DIAGNOSIS — M5442 Lumbago with sciatica, left side: Secondary | ICD-10-CM | POA: Diagnosis not present

## 2019-06-11 DIAGNOSIS — X58XXXA Exposure to other specified factors, initial encounter: Secondary | ICD-10-CM | POA: Diagnosis not present

## 2019-06-11 DIAGNOSIS — I10 Essential (primary) hypertension: Secondary | ICD-10-CM | POA: Diagnosis not present

## 2019-06-11 DIAGNOSIS — M5136 Other intervertebral disc degeneration, lumbar region: Secondary | ICD-10-CM | POA: Diagnosis not present

## 2019-06-11 DIAGNOSIS — G8929 Other chronic pain: Secondary | ICD-10-CM | POA: Diagnosis not present

## 2019-06-11 DIAGNOSIS — E785 Hyperlipidemia, unspecified: Secondary | ICD-10-CM | POA: Diagnosis not present

## 2019-06-11 DIAGNOSIS — M48061 Spinal stenosis, lumbar region without neurogenic claudication: Secondary | ICD-10-CM | POA: Diagnosis not present

## 2019-06-11 DIAGNOSIS — F419 Anxiety disorder, unspecified: Secondary | ICD-10-CM | POA: Diagnosis not present

## 2019-06-11 DIAGNOSIS — M545 Low back pain: Principal | ICD-10-CM

## 2019-06-11 NOTE — Unmapped (Signed)
ORTHOPAEDIC SPINE SURGERY       Arvil Persons. Lorenda Peck, MD  Assistant Professor  Orthopaedic Spine Surgery  [email or web address]  [email or web address]  [phone]             ?? Your diagnosis: The primary encounter diagnosis was Chronic bilateral low back pain with bilateral sciatica. A diagnosis of Low back pain radiating to both legs was also pertinent to this visit.      ?? Recommendations/Plan  ?? Activities as tolerated.  ?? Referrals: PMR, PT  ?? No follow up needed  ?? Surgery is not indicated at this time  ?? Avoid nicotine/tobacco  ?? Maintain a healthy weight    ?? Stay physically active and exercise regularly as tolerated ([email or web address])  ?? Maintain good sleeping habits    ?? We want to improve, and you can help.  You may receive a survey asking you about your visit.  Please take a few minutes to complete the survey.  We will use your feedback to make improvements.    ?? Contact our team electronically via MyChart message to [facility] Clinical Staff.    ?? Contact our team at [phone] or [phone] with any questions/concerns.  ?? If your symptoms abruptly get worse, you should call the office go to the nearest emergency room
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
## 2019-06-13 ENCOUNTER — Encounter: Admit: 2019-06-13 | Discharge: 2019-06-14 | Payer: PRIVATE HEALTH INSURANCE

## 2019-06-13 NOTE — Unmapped (Signed)
[facility]  Psychiatry Telehealth Encounter  Established Patient       Encounter Description/Consent: This encounter was conducted from [facility] psychiatry outpatient clinic via live, face-to-face video conference with the patient. Larry Montes was located in his home. Discussed the choice to participate in care through the use of telepsychiatry by telephone service. Telepsychiatry enables health care providers at different locations to provide safe, effective, and convenient care through the use of technology. As with any health care service, there are risks associated with the use of telepsychiatry, including lack of visualization and that there may be instances were they need to come to clinic to complete the assessment. Patient verbally understands the risks and benefits of telepsychaitry as explained. All questions regarding telepsychiatry answered.    I notified him that because this is a special type of visit, he may get a bill for a copay or coinsurance. He is OK with proceeding.    Time Spent: 30 minutes    [place] Covid19 helpline # is [phone].       [facility]  Psychiatry   Established Patient E&M Service - Outpatient     Name: Larry Montes  Date: 06/13/2019  MRN: [id number]  DOB: 1961/02/09  PCP: ERIC Nonda Lou, PA    Identifying Information:  Larry Montes is a 58 y.o. ??with a history of well controlled HIV, tobacco use disorder, TIA, HLD, HTN, depression and alcohol use disorder presenting today for treatment.  He lives with his partner and his sister and they just moved into a new house last month. He works full-time as a Production designer, theatre/television/film of an Engineer, maintenance.    Assessment: Larry Montes is a 58 y.o. ??with a history of well controlled HIV, tobacco use disorder, TIA, HLD, HTN, depression and alcohol use disorder presenting today for treatment.  He was referred to [place] from our infectious disease colleagues who identified some abnormal liver values and were able to uncover his alcohol use disorder.  He was open to a referral to our group for treatment.         He has a long history of alcohol use disorder starting from teenager with only one period of sobriety for a year and a half about 25 years ago.  He denies any history of alcohol withdrawal symptoms.  He is currently drinking about 24 ounces of vodka daily.  His goal would be to stop completely.  He is motivated by his increased weight which is worsening his back pain and increasing his hand and feet neuropathy.  He also acknowledges his age and would like to be in better health.         He has been decreasing his use, but he did pick a quit day and then he missed it.  Currently, he is drinking about half of 1/5 but tries to drink a little less each day.  He has moved into his new house and he loves it and is hoping this can signify a new start.  He he does have a goal of stopping completely.  He has a brother who is in and out of recovery himself for IV meth use and he has a significant source of stress for the patient.  The patient has had counseling in the past but did not find it very helpful, however he would be open to a more structured CBT type approach.  Risk Assessment: A suicide and violence risk assessment was performed as part of this evaluation.  There patient is deemed to be at chronic elevated risk for self-harm/suicide given the following factors: current substance abuse, current diagnosis of depression, previous acts of self-harm, chronic severe medical condition, chronic mental illness > 5 years, past substance abuse and past diagnosis of depression. The patient is deemed to be at chronic elevated risk for violence given the following factors: male gender. These risk factors are mitigated by the following factors:lack of active SI/HI, no know access to weapons or firearms, motivation for treatment, utilization of positive coping skills, supportive family, sense of responsibility to family and social supports, presence of a significant relationship, presence of an available support system, employment or functioning in a structured work/academic setting, enjoyment of leisure actvities, expresses purpose for living, religious or spiritual prohibition to suicide/violence, current treatment compliance, effective problem solving skills and safe housing. There is no acute risk for suicide or violence at this time. The patient was educated about relevant modifiable risk factors including following recommendations for treatment of psychiatric illness and abstaining from substance abuse.   While future psychiatric events cannot be accurately predicted, the patient does not currently require  acute inpatient psychiatric care and does not currently meet [facility] involuntary commitment criteria.  ??????????.           Stressors: work, Lawyer, Covid, etoh use disorder, twin brother with IV meth use disorder      Plan:  1. Medications  - continue sertraline 50 mg   - continue mirtazapine 30 mg  - start naltrexone 50 mg   - Pt reviewed on [place] PDMP on 06/13/19    2. Non Pharmacological Intervention  - urine tox screen: will do at next ID visit - pt sent treatment agreement via MyChart  FU- consider [facility]     3. Follow Up: 4 weeks       Psychotherapy:  No billable psychotherapy service provided but brief Motivational interviewing therapy was utilized.    Tobacco Cessation Counseling:  I assessed Larry Montes to be in an action stage with respect to tobacco use.     Continuing patches and doing well      He began smoking 46  years ago. He currently smokes 1/2 packs per day. He has not attempted to quit smoking in the past. Best success quitting using NA. Barriers to quitting include: NA. He reports no respiratory symptoms and denies chest pain, dyspnea on exertion, dyspnea while laying down, hemoptysis, non productive cough, productive cough, shortness of breath and wheezing.      Patient has been given this writer's contact information as well as the [facility] Psychiatry urgent line number. The patient has been instructed to call 911 for emergencies.    Patient was seen and plan of care was discussed with the Attending MD,Robyn [place], who agrees with the above statement and plan.    Subjective:     Psychiatric Chief Concern:  Follow-up psychiatric evaluation for Alcohol use disorder    Interval History:          He says that he is tolerating the naltrexone well with no side effects.  He says that it has in fact decreased his drinking now he is drinking about a half of 1/5/day.  He did pick a stop day but he missed it because there was still vodka in the bottle.  He would like to pick another stop day now, but he is a bit worried about just stopping.  However he has been drinking less over the  last month, and he denies any withdrawal symptoms when he has cut down in the past.  We discussed it and he has picked 06/24/19 as his next quit date. He says he is dealing with a lot of stress in the environment.  He is very upset about all the stress with Covid and the [place] election and he feels very unsettled.  Personally, he is very upset about his twin brother situation.  His twin brother is in and out of recovery for IV meth use and oftentimes is very close to living on the street.  He and his partner and sister have moved to a new house, however, and it is the house of his dreams.  He has been very busy with moving over the past week and several times he noted that he had a meltdown.  Now, however, things are much more settled and going better.  He continues to work and he gets frustrated sometimes with clients who get upset when there are drapes are not in when they arrived, when people are dying out there due to Covid.         He said that he was involved in counseling about 15 years ago but did not feel it was very helpful.  He liked the woman but did not feel that it made much of a difference.  However, he is willing to retry meeting with a counselor to discuss some of his ongoing issues.    Social History: reviewed; pertinents have been documented in the interval history section.    ROS:  As per Interval History and:  Constitutional:  none / negative  Neuro:  weakness, tingling and chronic back pain       Objective:    Mental Status Exam:  APPEARANCE: appropriately groomed, casually dressed, and appears stated age.   BEHAVIOR: Appropriate eye contact, facial expressions, and posture. No psychomotor activation/retardation   COGNITION: alert, able to attend to conversation   FUND OF KNOWLEDGE: average for age/education on gross exam, no formal testing done   ATTITUDE: calm, cooperative, and communicative.   SPEECH: normal rate, rhythm, volume.   LANGUAGE: fluent English, with no gross signs of dysarthria   MOOD:  Up-and-down   AFFECT: calm, cooperative THOUGHT PROCESS: Coherent, linear, and goal-oriented. No derailment, flight of ideas, or perseverance.   THOUGHT CONTENT: No suicidal/assaultive thoughts, plans, or intentions. No apparent delusions, ideas of reference, phobias, or preoccupations.   PERCEPTIONS: no overt hallucinations or illusions   INSIGHT: intact   JUDGEMENT: intact       Medications: reviewed at today's visit    Vitals: There were no vitals taken for this visit.    PE:   Vital signs were not reviewed.      Psychometrics:unable to do       Mitchel Honour, [place], [place]   06/13/2019

## 2019-07-11 ENCOUNTER — Encounter: Admit: 2019-07-11 | Discharge: 2019-07-12 | Payer: PRIVATE HEALTH INSURANCE

## 2019-07-11 MED ORDER — NALTREXONE 50 MG TABLET
ORAL_TABLET | Freq: Every day | ORAL | 3 refills | 30 days | Status: CP
Start: 2019-07-11 — End: 2020-07-10

## 2019-07-11 MED ORDER — NICOTINE 21 MG/24 HR DAILY TRANSDERMAL PATCH
MEDICATED_PATCH | TRANSDERMAL | 2 refills | 28.00000 days | Status: CP
Start: 2019-07-11 — End: ?

## 2019-07-11 NOTE — Unmapped (Signed)
[facility]  Psychiatry Telehealth Encounter  Established Patient       Encounter Description/Consent: This encounter was conducted from provider's home via live, face-to-face video conference with the patient. Larry Montes was located at his office. We discussed the choice to participate in care through the use of telepsychiatry by telephone service. Telepsychiatry enables health care providers at different locations to provide safe, effective, and convenient care through the use of technology. As with any health care service, there are risks associated with the use of telepsychiatry, including lack of visualization and that there may be instances were they need to come to clinic to complete the assessment. Patient verbally understands the risks and benefits of telepsychaitry as explained. All questions regarding telepsychiatry answered.    I notified him that because this is a special type of visit, he may get a bill for a copay or coinsurance. He is OK with proceeding.    Time Spent: 30 minutes    [place] Covid19 helpline # is [phone].       [facility]  Psychiatry   Established Patient E&M Service - Outpatient     Name: Larry Montes  Date: 07/11/2019  MRN: [id number]  DOB: 1960-08-10  PCP: ERIC Nonda Lou, PA    Identifying Information:  Larry Montes is a 58 y.o. ??with a history of well controlled HIV, tobacco use disorder, TIA, HLD, HTN, depression and alcohol use disorder presenting today for treatment.  He lives with his partner and his sister and they just moved into a new house last month. He works full-time as a Production designer, theatre/television/film of an Engineer, maintenance.    Assessment: Larry Montes is a 58 y.o. ??with a history of well controlled HIV, tobacco use disorder, TIA, HLD, HTN, depression and alcohol use disorder presenting today for treatment.  He was referred to [place] from our infectious disease colleagues who identified some abnormal liver values and were able to uncover his alcohol use disorder.  He was open to a referral to our group for treatment.         He has a long history of alcohol use disorder starting from teenager with only one period of sobriety for a year and a half about 25 years ago.  He denies any history of alcohol withdrawal symptoms.  He is currently drinking about 24 ounces of vodka daily.  His goal would be to stop completely.  He is motivated by his increased weight which is worsening his back pain and increasing his hand and feet neuropathy.  He also acknowledges his age and would like to be in better health.         After missing 2 of his chosen quit dates, he has not had a drink now since Monday, 07/08/2019.  He reports cravings but he says that he is keeping himself busy with work and hobbies and that seems to take care of it.  He has been sleeping well this past week.  He likes the naltrexone and feels like it does help.  He is also using the nicotine patches and is significantly decreasing his cigarette use to.  He feels that his new strategy, along with the fact that the decision to quit drinking this time was fully his own, are both positive tools that will help him to be successful in not restarting.  He declines attending group or individual therapy.  His sister has struggled with substance use disorder in the past and is a positive  support person for him.  However, his brother also struggles unsuccessfully with substances and is a significant stressor to the patient.           Given his very new sobriety, he asked that we meet again in 2 weeks.  However, once he shows some stability.  We will begin to transfer him to an outside clinic. Risk Assessment:  A suicide and violence risk assessment was performed as part of this evaluation. There patient is deemed to be at chronic elevated risk for self-harm/suicide given the following factors: current substance abuse, current diagnosis of depression, previous acts of self-harm, chronic severe medical condition, chronic mental illness > 5 years, past substance abuse and past diagnosis of depression. The patient is deemed to be at chronic elevated risk for violence given the following factors: male gender. These risk factors are mitigated by the following factors:lack of active SI/HI, no know access to weapons or firearms, motivation for treatment, utilization of positive coping skills, supportive family, sense of responsibility to family and social supports, presence of a significant relationship, presence of an available support system, employment or functioning in a structured work/academic setting, enjoyment of leisure actvities, expresses purpose for living, religious or spiritual prohibition to suicide/violence, current treatment compliance, effective problem solving skills and safe housing. There is no acute risk for suicide or violence at this time. The patient was educated about relevant modifiable risk factors including following recommendations for treatment of psychiatric illness and abstaining from substance abuse.   While future psychiatric events cannot be accurately predicted, the patient does not currently require  acute inpatient psychiatric care and does not currently meet [facility] involuntary commitment criteria.  ??????????.           Stressors: work, Lawyer, Covid, etoh use disorder, twin brother with IV meth use disorder      Plan:  1. Medications  - continue sertraline 50 mg   - continue mirtazapine 30 mg  -Continue naltrexone 50 mg   - Pt reviewed on [place] PDMP on  07/11/2019    2. Non Pharmacological Intervention  - urine tox screen: will do at next ID visit - pt sent treatment agreement via MyChart  FU- consider [facility]     3. Follow Up: 2 weeks     Psychotherapy:  No billable psychotherapy service provided but brief Motivational interviewing therapy was utilized.    Tobacco Cessation Counseling:  I assessed Larry Montes to be in an action stage with respect to tobacco use.     Continuing patches and doing well      He began smoking 46  years ago. He currently smokes 1/2 packs per day. He has not attempted to quit smoking in the past. Best success quitting using NA. Barriers to quitting include: NA. He reports no respiratory symptoms and denies chest pain, dyspnea on exertion, dyspnea while laying down, hemoptysis, non productive cough, productive cough, shortness of breath and wheezing.      Patient has been given this writer's contact information as well as the [facility] Psychiatry urgent line number. The patient has been instructed to call [phone] for emergencies.    Patient was seen and plan of care was discussed with the Attending MD,Robyn [place], who agrees with the above statement and plan.    Subjective:     Psychiatric Chief Concern:  Follow-up psychiatric evaluation for Alcohol use disorder    Interval History:            He  says that he is tolerating the naltrexone well with no side effects and he thinks it is working.  He missed his second quit date of 06/24/19, but he says that he has not had a drink since Monday 07/08/19. He reports that he has cravings but he says that he can stay busy and that seems to help it. He slept well last night. He says that he has a lot of crafts that he does and that keeps his mind occupied. He says that he stopped walking and reading when his mother got ill and he thinks that he can take these hobbies up again and knows will be very beneficial to him.   He did not call the counselors and he does not think that he will. He is not interested in attending any AA meetings. He reiterates that staying busy and having made the decision to quit for himself and for his health at this time is the difference this time. He says that his sister is in recovery herself and she is very supportive of him. He brother continues to be a stressor.  He has been kicked out of his safe place and he is worried about him, but he reminds himself that he cannot get pulled into his brothers drama.    He is doing pretty good with smoking now too. He says that he will go out to his car on breaks and smoke 2 cigarettes usually, but now he is only smoking 1.      Social History: reviewed; pertinents have been documented in the interval history section.    ROS:  As per Interval History and:  Constitutional:  none / negative  Neuro:  weakness, tingling and chronic back pain       Objective:    Mental Status Exam:  APPEARANCE: appropriately groomed, casually dressed, and appears stated age.   BEHAVIOR: Appropriate eye contact, facial expressions, and posture. No psychomotor activation/retardation   COGNITION: alert, able to attend to conversation   FUND OF KNOWLEDGE: average for age/education on gross exam, no formal testing done   ATTITUDE: calm, cooperative, and communicative.   SPEECH: normal rate, rhythm, volume.   LANGUAGE: fluent English, with no gross signs of dysarthria   MOOD:  Up-and-down   AFFECT: calm, cooperative  THOUGHT PROCESS: Coherent, linear, and goal-oriented. No derailment, flight of ideas, or perseverance.   THOUGHT CONTENT: No suicidal/assaultive thoughts, plans, or intentions. No apparent delusions, ideas of reference, phobias, or preoccupations.   PERCEPTIONS: no overt hallucinations or illusions   INSIGHT: intact   JUDGEMENT: intact       Medications: reviewed at today's visit    Vitals: There were no vitals taken for this visit.    PE:   Vital signs were not reviewed.      Psychometrics:unable to do       Larry Montes, [place], [place]   07/11/2019

## 2019-07-12 MED ORDER — NICOTINE 21 MG/24 HR DAILY TRANSDERMAL PATCH
MEDICATED_PATCH | TRANSDERMAL | 2 refills | 28.00000 days | Status: CP
Start: 2019-07-12 — End: ?

## 2019-07-14 MED ORDER — GABAPENTIN 600 MG TABLET
ORAL_TABLET | Freq: Three times a day (TID) | ORAL | 0 refills | 30 days | Status: CP
Start: 2019-07-14 — End: 2019-08-13

## 2019-07-17 NOTE — Unmapped (Signed)
Assessment/Plan:    Larry Montes was seen today for establish care.    Diagnoses and all orders for this visit:    Depression, unspecified depression type    Human immunodeficiency virus (HIV) disease (CMS-HCC)  -     Ambulatory referral to Internal Medicine    Essential hypertension  -     Ambulatory referral to Internal Medicine    Tobacco use  -     Ambulatory referral to Internal Medicine    Alcohol abuse, daily use  -     Ambulatory referral to Internal Medicine    Mixed hyperlipidemia  -     Ambulatory referral to Internal Medicine    Elevated LFTs  -     Hepatic Function Panel; Future    Hepatomegaly  -     Hepatic Function Panel; Future    Fatty liver  -     Hepatic Function Panel; Future    Obesity (BMI 30-39.9)    - History of alcohol abuse/elevated LFT's and hepatomegaly with fibrofatty liver changes noted on recent US: pt advised to reduce atorvastatin dose to 1/2 of a 40 mg tab daily along with continued reduction in alcohol consumption with Naltrexon on board. In addition obesity and increased abd girth is contributing to liver disease.  He is counseled on carb/sugar restriction to promote weight loss. He declined referral to [facility] GI/Liver clinic. He will need repeat liver US next summer.   - obesity: pt counseled on diet and exercise to promote weight loss. Screening TSH was normal in 04/2019.  - HTN: good control on present medication regimen. He is advised to start weekly home BP monitoring, low salt dietary intake and resume exercise routine. He had normal electrolytes, creatinine and glucose level in 04/2019.  - dyslipidemia: pt had LDL of 42 inn 04/2019. He will reduced dose of lipitor as noted above in light of current liver disease and elevated LFT's. He will return for lab appt in 1 month for repeat hepatic panel.  - depression: stable on Remeron and zoloft as directed.  - tobacco use disorder: the pt is wearing a nicotine patch as directed and this has helped him with his smoking.  He is encouraged to reduce and ultimately stop smoking cigarettes.  - alcohol use disorder: pt will continue with naltrexone as directed and I stress importance of the pt to stop drinking alcohol.  - HIV- stable on current medication regimen. He will follow up with ID clinic as scheduled.  Return in about 4 months (around 11/21/2019) for follow up in 4 months and lab appt in 1 month.    Subjective:     HPI  New pt visit seen today to establish with a new PCP and review PMH.  He has a history of well controlled HIV, tobacco use disorder, Bells palsy, , HLD, obesity, HTN, lumbar spinal stenosis with neurogenic claudication, depression??and alcohol use disorder.   He was recently referred and seen by [facility] alcohol and substance abuse clinic early this month when his ID provider noted elevated LFT's on routine lab work and the pt endorsed history of alcohol abuse.  He is also followed by [facility] for his history of lumbar spinal stenosis. He has been referred to PT. He has not scheduled PT as of yet. He continues on gabapentin for this condition.  He was last seen by his ID provider in 05/2019 . He is followed by Dr. Ala Dach, psychiatrist at substance abuse clinic. He remains stable on sertraline ,  remeron and naltrexone as directed. With respect to his history of HTN, dyslipidemia and TIA, he continues on statin regimen, low dose aspirin, ACEI and diuretic.    He has been counseled on tobacco cessation and he has been prescribed Nicotine patch.  CMP done in 04/2019 revealed normal serum electrolytes, creatinine and glucose levels.  AST and ALT levels were mildly elevated at 63/50. Lipid panel from that time revealed HDL/LDL of 56/42., with elevated TG at 382.  TSH and CBC were essentially normal.  He is drinking alcohol but very little, down by 90% and he has reduced his smoking with the Nicoderm patch.  His abd [place] from 04/2019 revealed the following:  IMPRESSION:  Enlarged echogenic liver, consistent with fibrofatty changes. Please that MRI is more sensitive for detection of hepatic lesions but no hepatic lesions are identified on this examination.  Mild gallbladder wall thickening, likely reactive in the setting of chronic liver disease.  He is followed by ID clinic every 6 months.       ROS  Constitutional:  Denies  unexpected weight loss or gain, or weakness   Eyes:  Denies visual changes  Respiratory:  Denies cough or shortness of breath. No change in exercise  tolerance  Cardiovascular:  Denies chest pain, palpitations or lower extremity swelling   GI:  Denies abdominal pain, diarrhea, constipation   Musculoskeletal:  Denies myalgias  Skin:  Denies nonhealing lesions  Neurologic:  Denies headache, focal weakness or numbness, tingling  Endocrine:  Denies polyuria or polydypsia   Psychiatric:  Denies depression, anxiety      Outpatient Medications Prior to Visit   Medication Sig Dispense Refill   ??? aspirin (ECOTRIN) 81 MG tablet Take 1 tablet (81 mg total) by mouth daily. 90 each 2   ??? atorvastatin (LIPITOR) 40 MG tablet Take 1 tablet (40 mg total) by mouth daily. (Patient taking differently: Take 40 mg by mouth daily. 1/2 a tablet a day) 90 tablet 3   ??? bictegrav-emtricit-tenofov ala (BIKTARVY) 50-200-25 mg tablet Take 1 tablet by mouth daily. 90 tablet 5   ??? gabapentin (NEURONTIN) 600 MG tablet Take 1.5 tablets (900 mg total) by mouth Three (3) times a day. 135 tablet 0   ??? hydroCHLOROthiazide (HYDRODIURIL) 25 MG tablet Take 1 tablet (25 mg total) by mouth daily. TAKE 1 TABLET(25 MG) BY MOUTH DAILY 90 tablet 2   ??? lisinopriL (PRINIVIL,ZESTRIL) 40 MG tablet Take 1 tablet (40 mg total) by mouth daily. 90 tablet 2   ??? mirtazapine (REMERON) 30 MG tablet Take 1 tablet (30 mg total) by mouth nightly. 30 tablet 5   ??? naltrexone (DEPADE) 50 mg tablet Take 1 tablet (50 mg total) by mouth daily. 30 tablet 3   ??? nicotine (NICODERM CQ) 21 mg/24 hr patch Place 1 patch on the skin daily. 28 patch 2   ??? omeprazole (PRILOSEC) 20 MG capsule TAKE 1 CAPSULE(20 MG) BY MOUTH DAILY 30 capsule 3   ??? sertraline (ZOLOFT) 100 MG tablet TAKE 2 TABLETS(200 MG) BY MOUTH DAILY 60 tablet 5     No facility-administered medications prior to visit.          Objective:     45 min new pt visit, greater then 50% counseling and coordination of care  Vital Signs  BP 112/68  - Pulse 88  - Temp 36.9 ??C (98.4 ??F)  - Ht 177.8 cm (5' 10)  - Wt (!) 109.3 kg (241 lb)  - SpO2 98%  - BMI 34.58  kg/m??      Exam  General: normal appearance  EYES: Anicteric sclerae.  ENT: Oropharynx moist.  RESP: Relaxed respiratory effort. Clear to auscultation without wheezes or crackles.   CV: Regular rate and rhythm. Normal S1 and S2. No murmurs or gallops.  No lower extremity edema. Posterior tibial pulses are 2+ and symmetric.  abd exam: non tender, no masses, no HSM   MSK: No focal muscle tenderness.  SKIN: Appropriately warm and moist.  NEURO: Stable gait and coordination.    Allergies:     Chantix [varenicline] and Pollen extracts    Current Medications:     Current Outpatient Medications   Medication Sig Dispense Refill   ??? aspirin (ECOTRIN) 81 MG tablet Take 1 tablet (81 mg total) by mouth daily. 90 each 2   ??? atorvastatin (LIPITOR) 40 MG tablet Take 1 tablet (40 mg total) by mouth daily. (Patient taking differently: Take 40 mg by mouth daily. 1/2 a tablet a day) 90 tablet 3   ??? bictegrav-emtricit-tenofov ala (BIKTARVY) 50-200-25 mg tablet Take 1 tablet by mouth daily. 90 tablet 5   ??? gabapentin (NEURONTIN) 600 MG tablet Take 1.5 tablets (900 mg total) by mouth Three (3) times a day. 135 tablet 0   ??? hydroCHLOROthiazide (HYDRODIURIL) 25 MG tablet Take 1 tablet (25 mg total) by mouth daily. TAKE 1 TABLET(25 MG) BY MOUTH DAILY 90 tablet 2   ??? lisinopriL (PRINIVIL,ZESTRIL) 40 MG tablet Take 1 tablet (40 mg total) by mouth daily. 90 tablet 2   ??? mirtazapine (REMERON) 30 MG tablet Take 1 tablet (30 mg total) by mouth nightly. 30 tablet 5   ??? naltrexone (DEPADE) 50 mg tablet Take 1 tablet (50 mg total) by mouth daily. 30 tablet 3   ??? nicotine (NICODERM CQ) 21 mg/24 hr patch Place 1 patch on the skin daily. 28 patch 2   ??? omeprazole (PRILOSEC) 20 MG capsule TAKE 1 CAPSULE(20 MG) BY MOUTH DAILY 30 capsule 3   ??? sertraline (ZOLOFT) 100 MG tablet TAKE 2 TABLETS(200 MG) BY MOUTH DAILY 60 tablet 5     No current facility-administered medications for this visit.            Note - This record has been created using [employer]. Chart creation errors have been sought, but may not always have been located. Such creation errors do not reflect on the standard of medical care.    Jenell Milliner, MD

## 2019-07-18 NOTE — Unmapped (Signed)
Spoke with patient about Winter renewal. Patient requested HMAP application to be mailed. Patient is going to send over insurance premium information by Dec 18th. Verified mailing address in Epic.

## 2019-07-19 ENCOUNTER — Encounter
Admit: 2019-07-19 | Discharge: 2019-07-20 | Payer: PRIVATE HEALTH INSURANCE | Attending: Physical Medicine & Rehabilitation | Primary: Physical Medicine & Rehabilitation

## 2019-07-19 DIAGNOSIS — M545 Low back pain: Secondary | ICD-10-CM | POA: Diagnosis not present

## 2019-07-19 DIAGNOSIS — M5441 Lumbago with sciatica, right side: Secondary | ICD-10-CM | POA: Diagnosis not present

## 2019-07-19 DIAGNOSIS — M47817 Spondylosis without myelopathy or radiculopathy, lumbosacral region: Secondary | ICD-10-CM | POA: Diagnosis not present

## 2019-07-19 DIAGNOSIS — G8929 Other chronic pain: Secondary | ICD-10-CM | POA: Diagnosis not present

## 2019-07-19 DIAGNOSIS — M48062 Spinal stenosis, lumbar region with neurogenic claudication: Secondary | ICD-10-CM | POA: Diagnosis not present

## 2019-07-19 DIAGNOSIS — M5442 Lumbago with sciatica, left side: Secondary | ICD-10-CM | POA: Diagnosis not present
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
## 2019-07-19 NOTE — Unmapped (Addendum)
Thanks so much for coming to see Dr. Riccardo Dubin today. It was a pleasure to meet you. This summary reviews the goals and plans we discussed at your visit today.     Below, you will see: a) your working diagnosis b) your treatment plan and c) your next steps and followup plan.    We care about your quality of life and are committed to helping optimize your functionality.     DIAGNOSIS:  Neurogenic claudication with axial low back pain from the facet joints.    I have ordered spine injections to help with the axial low back pain from your facet joints.    I referred you to physical therapy here at the spine center.    See me in 8-12 weeks    We discussed signs or symptoms I would require an emergency room visit.  This includes bowel bladder incontinence, progressive weakness, numbness of the legs that does not resolve with position.

## 2019-07-19 NOTE — Unmapped (Signed)
Received supporting documents for 2021 PCAP enrollment for insurance through the federal marketplace. Insurance premium amout is $177.08 with [facility] Silver Enhaced 725.  Sent this information to [facility] coordinator via secure [employer].      Larry Montes    Time Duration of intervention in minutes: 10 mins

## 2019-07-22 NOTE — Unmapped (Addendum)
Due to elevated liver enzymes and history of fibrofatty liver changes noted on recent US- I recommend reducing atorvastatin dose to 1/2 of a 40 mg tablet daily

## 2019-07-23 ENCOUNTER — Encounter: Admit: 2019-07-23 | Discharge: 2019-07-24 | Payer: PRIVATE HEALTH INSURANCE

## 2019-07-23 DIAGNOSIS — F329 Major depressive disorder, single episode, unspecified: Secondary | ICD-10-CM | POA: Diagnosis not present

## 2019-07-23 DIAGNOSIS — B20 Human immunodeficiency virus [HIV] disease: Secondary | ICD-10-CM | POA: Diagnosis not present

## 2019-07-23 DIAGNOSIS — I10 Essential (primary) hypertension: Secondary | ICD-10-CM | POA: Diagnosis not present

## 2019-07-23 DIAGNOSIS — Z720 Tobacco use: Secondary | ICD-10-CM | POA: Diagnosis not present

## 2019-07-23 DIAGNOSIS — K760 Fatty (change of) liver, not elsewhere classified: Principal | ICD-10-CM

## 2019-07-23 DIAGNOSIS — R160 Hepatomegaly, not elsewhere classified: Principal | ICD-10-CM

## 2019-07-23 DIAGNOSIS — E782 Mixed hyperlipidemia: Principal | ICD-10-CM

## 2019-07-23 DIAGNOSIS — E669 Obesity, unspecified: Principal | ICD-10-CM

## 2019-07-23 DIAGNOSIS — F1010 Alcohol abuse, uncomplicated: Principal | ICD-10-CM

## 2019-07-23 DIAGNOSIS — R945 Abnormal results of liver function studies: Principal | ICD-10-CM

## 2019-08-06 ENCOUNTER — Encounter: Admit: 2019-08-06 | Discharge: 2019-08-06 | Payer: PRIVATE HEALTH INSURANCE

## 2019-08-06 DIAGNOSIS — R918 Other nonspecific abnormal finding of lung field: Secondary | ICD-10-CM | POA: Diagnosis not present

## 2019-08-06 DIAGNOSIS — Z23 Encounter for immunization: Secondary | ICD-10-CM | POA: Diagnosis not present

## 2019-08-06 DIAGNOSIS — F1010 Alcohol abuse, uncomplicated: Secondary | ICD-10-CM | POA: Diagnosis not present

## 2019-08-06 DIAGNOSIS — R062 Wheezing: Secondary | ICD-10-CM | POA: Diagnosis not present

## 2019-08-06 DIAGNOSIS — R7989 Other specified abnormal findings of blood chemistry: Secondary | ICD-10-CM | POA: Diagnosis not present

## 2019-08-06 DIAGNOSIS — R160 Hepatomegaly, not elsewhere classified: Secondary | ICD-10-CM | POA: Diagnosis not present

## 2019-08-06 DIAGNOSIS — K760 Fatty (change of) liver, not elsewhere classified: Secondary | ICD-10-CM | POA: Diagnosis not present

## 2019-08-06 DIAGNOSIS — B20 Human immunodeficiency virus [HIV] disease: Secondary | ICD-10-CM | POA: Diagnosis not present

## 2019-08-06 DIAGNOSIS — R0989 Other specified symptoms and signs involving the circulatory and respiratory systems: Secondary | ICD-10-CM | POA: Diagnosis not present

## 2019-08-06 DIAGNOSIS — R945 Abnormal results of liver function studies: Principal | ICD-10-CM

## 2019-08-06 LAB — HEPATIC FUNCTION PANEL
ALKALINE PHOSPHATASE: 84 U/L (ref 38–126)
AST (SGOT): 44 U/L (ref 19–55)
BILIRUBIN DIRECT: 0.3 mg/dL (ref 0.00–0.40)
BILIRUBIN TOTAL: 0.6 mg/dL (ref 0.0–1.2)

## 2019-08-06 LAB — CBC W/ AUTO DIFF
BASOPHILS ABSOLUTE COUNT: 0 10*9/L (ref 0.0–0.1)
BASOPHILS RELATIVE PERCENT: 0.4 %
EOSINOPHILS ABSOLUTE COUNT: 0.2 10*9/L (ref 0.0–0.4)
EOSINOPHILS RELATIVE PERCENT: 2.8 %
HEMOGLOBIN: 14.4 g/dL (ref 13.5–17.5)
LARGE UNSTAINED CELLS: 1 % (ref 0–4)
LYMPHOCYTES RELATIVE PERCENT: 25.2 %
MEAN CORPUSCULAR HEMOGLOBIN: 30.3 pg (ref 26.0–34.0)
MEAN CORPUSCULAR VOLUME: 97.7 fL (ref 80.0–100.0)
MEAN PLATELET VOLUME: 9.5 fL (ref 7.0–10.0)
MONOCYTES ABSOLUTE COUNT: 0.4 10*9/L (ref 0.2–0.8)
MONOCYTES RELATIVE PERCENT: 6.3 %
NEUTROPHILS ABSOLUTE COUNT: 4 10*9/L (ref 2.0–7.5)
NEUTROPHILS RELATIVE PERCENT: 64.4 %
RED BLOOD CELL COUNT: 4.74 10*12/L (ref 4.50–5.90)
RED CELL DISTRIBUTION WIDTH: 14.1 % (ref 12.0–15.0)
WBC ADJUSTED: 6.2 10*9/L (ref 4.5–11.0)

## 2019-08-06 LAB — BASIC METABOLIC PANEL
ANION GAP: 9 mmol/L (ref 7–15)
BLOOD UREA NITROGEN: 21 mg/dL (ref 7–21)
BUN / CREAT RATIO: 19
CALCIUM: 9.7 mg/dL (ref 8.5–10.2)
CHLORIDE: 99 mmol/L (ref 98–107)
CO2: 28 mmol/L (ref 22.0–30.0)
CREATININE: 1.1 mg/dL (ref 0.70–1.30)
EGFR CKD-EPI AA MALE: 84 mL/min/{1.73_m2} (ref >=60)
EGFR CKD-EPI NON-AA MALE: 73 mL/min/{1.73_m2} (ref >=60)
GLUCOSE RANDOM: 100 mg/dL (ref 70–179)
SODIUM: 136 mmol/L (ref 135–145)

## 2019-08-06 LAB — BUN / CREAT RATIO: Urea nitrogen/Creatinine:MRto:Pt:Ser/Plas:Qn:: 19

## 2019-08-06 LAB — ALBUMIN: Albumin:MCnc:Pt:Ser/Plas:Qn:: 4.6

## 2019-08-06 LAB — BASOPHILS ABSOLUTE COUNT: Basophils:NCnc:Pt:Bld:Qn:Automated count: 0

## 2019-08-06 NOTE — Unmapped (Signed)
Assessment/Plan:      Larry Montes, a 58 y.o. male seen today for routine HIV follow up.    Plan:    Larry Montes is a 58 yo man with a history of well controlled HIV, tobacco use disorder, alcohol use d/o, HLD, HTN, low back pain with radiculopathy, and depression here for follow-up of his HIV.     PCP is Dr. Vinson Moselle at [facility].    HIV  - He is doing very well from an HIV perspective. He has been on Biktarvy since 09/2017 and is taking it daily without any recent missed doses.   ?? Continue current therapy.   ?? Repeat HIV RNA and safety labs today.  ?? Encouraged continued excellent ARV adherence.  ?? VL undetectable and CD4 of 737 as of September 2020.  ?? Follow-up in 6 months  Lab Results   Component Value Date    ACD4 737 05/02/2019    CD4 52 05/02/2019    HIVCP <40 (H) 05/07/2018    HIVRS Not Detected 05/02/2019     Memory Issues- New concern.  - 6+ months of concern over memory issues including misplacing items, forgetting daily tasks and difficulty finding words. Family history of dementia in his grandmother.   - Requesting additional evaluation- I will message his PCP to discuss best next step for evaluation.    Low back pain with radiculopathy  - History of spinal stenosis and neurogenic claudication.   - 10/7- MRI with moderate to severe bilateral neural foraminal narrowing and spinal canal stenosis, worst at the L4-S1 levels (see below for full report)  - Previously not interested in surgery (as recommended by Spine Surgeon Dr. Lorenda Peck) but after seeing PM&R is now interested in surgery.  - Symptoms stable today but still signficant and impact daily activity.  - Advised to call [facility] Center for follow-up to start evaluation for surgery.  - Discussed need to quit smoking and also work on weight loss.    Essential hypertension  - Well controlled on Lisinopril and HCTZ.  - Continue current regimen.  - Follow-up with PCP as scheduled.    Alcohol abuse, daily use  - Currently followed by [facility] program and taking Naltrexone 50mg  daily. He has missed 2 previous quit dates but has greatly decreased his alcohol use and reports that he does not plan to buy any more alcohol.  - Congratulated on his progress and advised close follow-up with [facility].    Tobacco use-  - Started smoking at age 74, smoked 1 ppd for most of his life, now at 5cpd.   He has been using the nicotine patch and requests refills. He feels 21mg  patch helps with nicotine and alcohol cravings.  - Cannot be on Chantix due to neuropsychiatric SE.   - Continue Nicotine patch and discussed tapering strategy to cessation. This will be especially important if he is considering spine surgery.  - Overdue for Lung Cancer Screening- advised to call to schedule.    Right posterior parotid mass: -??Not addressed today.  Found incidentally on OSH MRI. Pt says has been there for years and is unchanged.    Sexual health & secondary prevention  Sex with men. Monogamous with single partner. Declines STI testing today.     Lab Results   Component Value Date    RPR Nonreactive 10/02/2018    CTNAA Negative 05/10/2017    GCNAA Negative 05/10/2017    SPECTYPE Swab 05/10/2017    SPECSOURCE Rectal-[place] Only 05/10/2017  Health maintenance  Lab Results   Component Value Date    CREATININE 1.10 08/06/2019    QFTTBGOLD Negative 03/24/2016    HEPCAB Nonreactive 05/02/2019    HCVRNA Not Detected 10/01/2015    CHOL 174 05/02/2019    HDL 56 05/02/2019    LDL 42 (L) 05/02/2019    NONHDL 118 05/02/2019    TRIG 382 (H) 05/02/2019    PSA 0.90 05/10/2017    A1C 5.4 11/09/2016    FINALDX  07/07/2017     A.  Colon, transverse, biopsy  - Hyperplastic polyp (multiple fragments)    B.  Colon, descending, biopsy  - Hyperplastic polyp (multiple fragments)    C.  Colon, sigmoid, biopsy  - Hyperplastic polyp (multiple fragments)    D.  Colon, sigmoid #2, biopsy  - Hyperplastic polyp (multiple fragments)    E.  Colon, rectum, biopsy  - Hyperplastic polyp (multiple fragments) Communicable diseases  # TB - no longer needed; negative 03/24/2016 and low/no risk  # HCV - negative 11/09/2016; rescreen w/Ab q1-2y    Cancer screening  # Anorectal - neg 05/2017- Overdue  # Colorectal - Numerous colon polyps on 05/2017 c-scope. Per Dr. Fara Boros, 10 year follow-up recommended  # Liver - not applicable  # Lung - neg 04/2018. Repeat in 12 months- Overdue    Immunization History   Administered Date(s) Administered   ??? Hepatitis A 04/21/2010, 12/29/2010   ??? Hepatitis B, Adult 07/08/2009, 11/04/2009, 04/21/2010, 05/10/2017   ??? INFLUENZA TIV (TRI) PF (IM) 07/08/2009, 04/21/2010, 06/23/2011   ??? Influenza Vaccine Quad (IIV4 PF) 54mo+ injectable 06/28/2012, 05/02/2013, 08/28/2014, 10/01/2015, 05/05/2016, 05/10/2017, 05/07/2018, 05/02/2019   ??? PNEUMOCOCCAL POLYSACCHARIDE 23 02/19/2009, 02/26/2015   ??? PPD Test 11/04/2009, 12/29/2010   ??? Pneumococcal Conjugate 13-Valent 03/27/2014   ??? SHINGRIX-ZOSTER VACCINE (HZV), RECOMBINANT,SUB-UNIT,ADJUVANTED IM 08/06/2019   ??? TdaP 02/19/2009   ??? Tetanus and diptheria,(adult), adsorbed, 2Lf tetanus toxoid, PF 08/06/2019       Counseling services took 10 minutes of today's visit time.  Counseled as documented above regarding medication adherence and weight management and healthy lifestyle choices.    Next Visit:  - Spine Surgery follow-up  - Reassess alcohol/tobacco use  - Anal pap  - LDCT completed for lung cancer screening?    Disposition  Return to clinic 5-6 months or sooner if needed.    Lahoma Rocker, PA-C  [facility]   [address] 1st floor   [place], [place]. [address]   Phone: [phone]   Fax: [phone]     Subjective:      Chief Complaint   HIV followup    HPI  Return patient visit for Larry Montes, a 58 y.o. male.     See Assessment & Plan for additional details    -Mr. Barajas's main concern today is some memory issues he has been having.  He reports that he has been searching for words more and has some short-term memory loss such as whether he completed tasks at work where he placed items.  He is not sure if this is associated with his use of Remeron and Neurontin, but feels that his memory issues have been more noticeable over the last 6+ months.  He is looking for additional evaluation.  He has a family history of dementia in his grandmother, but she was diagnosed late in life at age 80.  -He is taking Biktarvy daily without any recent missed doses.  - He has established with a PCP in  [place] and is very happy there.  He continues to work with the alcohol and substance use program and has made significant progress toward abstinence from alcohol.  He has gone several days where he is not drink at all, although currently is drinking 1-2 shots of vodka daily.  He reports having a quarter of a bottle of vodka and rum left in his house as well as two wine bottles which he plans to finish and then not buy any more alcohol.     -His back symptoms are stable, but he continues to have numbness and tingling in his lower extremities with standing.  No loss of bowel or bladder control or weakness in his legs.  He would like to consider surgical options.    Past Medical History:   Diagnosis Date   ??? Alcohol abuse     Hospitalized in the past - has been able to abstain for periods of time   ??? Anxiety 1980   ??? Arthritis 2020   ??? Bell's palsy 2018   ??? Depression    ??? GERD (gastroesophageal reflux disease) 1990   ??? HIV disease (CMS-HCC)    ??? Hyperlipidemia    ??? Hypertension    ??? Osteoporosis 2000   ??? Sickle cell anemia (CMS-HCC)      Medications and Allergies   Reviewed and updated today. See bottom of this visit's encounter summary for details.  Current Outpatient Medications on File Prior to Visit   Medication Sig   ??? aspirin (ECOTRIN) 81 MG tablet Take 1 tablet (81 mg total) by mouth daily.   ??? atorvastatin (LIPITOR) 40 MG tablet Take 1 tablet (40 mg total) by mouth daily. (Patient taking differently: Take 40 mg by mouth daily. 1/2 a tablet a day) ??? bictegrav-emtricit-tenofov ala (BIKTARVY) 50-200-25 mg tablet Take 1 tablet by mouth daily.   ??? gabapentin (NEURONTIN) 600 MG tablet Take 1.5 tablets (900 mg total) by mouth Three (3) times a day.   ??? hydroCHLOROthiazide (HYDRODIURIL) 25 MG tablet Take 1 tablet (25 mg total) by mouth daily. TAKE 1 TABLET(25 MG) BY MOUTH DAILY   ??? lisinopriL (PRINIVIL,ZESTRIL) 40 MG tablet Take 1 tablet (40 mg total) by mouth daily.   ??? mirtazapine (REMERON) 30 MG tablet Take 1 tablet (30 mg total) by mouth nightly.   ??? naltrexone (DEPADE) 50 mg tablet Take 1 tablet (50 mg total) by mouth daily.   ??? nicotine (NICODERM CQ) 21 mg/24 hr patch Place 1 patch on the skin daily.   ??? omeprazole (PRILOSEC) 20 MG capsule TAKE 1 CAPSULE(20 MG) BY MOUTH DAILY   ??? sertraline (ZOLOFT) 100 MG tablet TAKE 2 TABLETS(200 MG) BY MOUTH DAILY     No current facility-administered medications on file prior to visit.      Allergies   Allergen Reactions   ??? Chantix [Varenicline] Other (See Comments)     CNS symptoms - anxiety, agitation   ??? Pollen Extracts Other (See Comments)     Congestion       Social History  Lives with his >84yr male partner in [place]. 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   ??  Has sex with men only. Bottom (receptive) only. Gives and receives oral sex.  No personal h/o STDs -- says he was celibate for years before meeting his current partner.  ??  Past history of IV cocaine use in the late 1990s, but nothing since.  Uses MJ occasionally.  Ongoing smoking  1/4 ppd.  Social History     Tobacco Use   ??? Smoking status: Current Every Day Smoker     Packs/day: 0.50     Years: 35.00     Pack years: 17.50     Types: Cigarettes     Start date: 05/02/1973   ??? Smokeless tobacco: Never Used   ??? Tobacco comment: attempting to quite via help with patches   Substance Use Topics   ??? Alcohol use: Yes     Alcohol/week: 40.0 standard drinks     Types: 40 Shots of liquor per week     Comment: 1/2 gallon of vodka per week     Review of Systems  As per HPI. Remainder of 10 systems reviewed, negative.      Objective:         Physical Exam   Physical Exam   Constitutional: He is oriented to person, place, and time and well-developed, well-nourished, and in no distress. No distress.   HENT:   Mouth/Throat: Oropharynx is clear and moist.   Cardiovascular: Normal rate, regular rhythm and normal heart sounds.   Pulmonary/Chest: Effort normal and breath sounds normal.   Neurological: He is alert and oriented to person, place, and time.   Skin: Skin is warm and dry. He is not diaphoretic.   Trace edema in bilateral ankles   Psychiatric: Mood, memory, affect and judgment normal.     Laboratory Data  Reviewed in Epic today, using Synopsis and Chart Review filters.    Lab Results   Component Value Date    CREATININE 1.10 08/06/2019    QFTTBGOLD Negative 03/24/2016    HEPCAB Nonreactive 05/02/2019    HCVRNA Not Detected 10/01/2015    CHOL 174 05/02/2019    HDL 56 05/02/2019    LDL 42 (L) 05/02/2019    NONHDL 118 05/02/2019    TRIG 382 (H) 05/02/2019    PSA 0.90 05/10/2017    A1C 5.4 11/09/2016    FINALDX  07/07/2017     A.  Colon, transverse, biopsy  - Hyperplastic polyp (multiple fragments)    B.  Colon, descending, biopsy  - Hyperplastic polyp (multiple fragments)    C.  Colon, sigmoid, biopsy  - Hyperplastic polyp (multiple fragments)    D.  Colon, sigmoid #2, biopsy  - Hyperplastic polyp (multiple fragments)    E.  Colon, rectum, biopsy  - Hyperplastic polyp (multiple fragments)           Narrative & Impression     EXAM: Magnetic resonance imaging, lumbar spine without and with contrast.  DATE: 05/15/2019 2:42 PM  ACCESSION: [id number]  DICTATED: 05/15/2019 2:46 PM  INTERPRETATION LOCATION: [facility]  ??  CLINICAL INDICATION: 58 years old Male with low back pain with radiculopathy  - M54.5 - Low back pain radiating to both legs bilateral leg numbness.  ??  COMPARISON: 12/26/2008 lumbar spine MRI.  ??  TECHNIQUE: Multiplanar MRI was performed through the lumbar spine prior to and following intravenous contrast administration.  ??  FINDINGS:   For the purposes of this dictation, the lowest well formed intervertebral disc space is assumed to be the L5-S1 level, and there are presumed to be five lumbar-type vertebral bodies.  ??  Similar 1.1 cm S1 vertebral body hemangioma. Bone marrow signal intensity is otherwise normal. There is no abnormal cord enhancement. The vertebral bodies are normally aligned.   ??  L1-L2: Diffuse disc bulge. Mild ligamentum flavum hypertrophy. Mild facet arthropathy. Mild bilateral neuroforaminal narrowing.  Mild spinal canal narrowing.  ??  L2-L3: Diffuse disc bulge. Mild ligamentum flavum hypertrophy. Facet arthropathy. Mild to moderate left greater than right neuroforaminal narrowing. Mild to moderate spinal canal stenosis  ??  L3-L4: Diffuse disc bulge. Mild ligamentum flavum hypertrophy mild facet arthropathy. Moderate bilateral neuroforaminal narrowing. Moderate spinal canal stenosis Bilateral lateral recess narrowing.  ??  L4-L5: Diffuse disc bulge. Mild ligamentum flavum hypertrophy. Moderate facet arthropathy. Moderate to severe bilateral neuroforaminal narrowing. Severe spinal canal narrowing with at least moderate bilateral lateral recess narrowing.  ??  L5-S1: Diffuse disc bulge. Mild ligamentum flavum hypertrophy. Severe exuberant facet arthropathy. Moderate to severe bilateral neuroforaminal narrowing. Severe spinal canal narrowing with moderate bilateral lateral recess narrowing.  ??  There is mild T2/STIR increased signal enhancement noted around the L5-S1 bilateral facet joints likely representing inflammatory changes related to the arthropathy. The right L5-S1 facet cyst of a small effusion best seen on coronal haste images (7:7). No mass lesion or abnormal fluid collections noted in this region.  ??  There is mild T2 hyperintensity noted within the cauda equina at the L2 and L3 levels. The visualized cord is otherwise unremarkable and the conus medullaris ends at a normal level  ??  The paraspinal tissues are within normal limits. Epidural lipomatosis is noted from L2 through L4.  ??  IMPRESSION:  ??  -Moderate to severe bilateral neural foraminal narrowing and spinal canal stenosis greatest at the L4-L5 and L5-S1 levels.  ??  -There is exuberant facet arthropathy noted at the L5-S1 level with associated T2/FLAIR increased signal and enhancement. No associated mass lesion or fluid collection noted. Findings are likely representing of inflammatory changes related to the arthropathy. Question history of recent trauma.  ??  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 Findings may represent mild cord edema related to adjacent moderate to severe spinal canal stenosis.

## 2019-08-06 NOTE — Unmapped (Addendum)
It was great to see you today.    Please go to the [facility] and tell them your provider ordered a chest x-ray.   The address is: [address] [place], [place] [address]    Congratulations on cutting down so much on your alcohol use!    Phone number for Spine Surgeon is: [phone]     Please note:  New Clinic Location at [facility] 1: [address] , [address] [place], [place] -  [address]    Contacting us   During working hours  [phone]  After hours or weekends ([phone]) [phone] and ask for the ID doctor on call  Fax number   [phone]    MEDICATIONS  For refills, please contact your pharmacy and ask them to electronically send or fax the request to the clinic.     Please bring all medications in original bottles to every appointment.    [facility] (formerly [facility]) or [employer] Eligibility (required even if you do not receive medication through [facility])  Please remember to renew your Juanell Fairly eligibility during renewal periods which occur twice a year: January-March and July-September.     The following are needed for each renewal:   - [facility] Identification (if you don't have one, then a bill with your name and address in [place])   - proof of income (award letter, W-2, or last three check stubs)   If you are unable to come in for renewal, let us know if we can mail, fax or e-mail paperwork to you.   [facility]MAP Contact: [phone]      Urgent Care Clinic  Monday, Tuesday, and Thursday from 8:30 - 12 noon  Please call ahead to speak with the nursing staff if you think you need to be seen urgently!    Lab info:  Your most recent CD4 T-cell counts and viral loads are below. Here are a few things to keep in mind when looking at your numbers:  ?? For most people, we're checking CD4 counts fairly infrequently (once a year or less)  ?? It's normal for your CD4 count to be different from visit to visit.   ?? We consider your viral load to be undetectable if it says <40 or if it says Not detected.  ?? Our goal is to get your virus to be undetectable and keep it undetectable. You can help by taking your medications at about the same time, every single day. If you're having trouble with taking your medications, it's important to let us know.    YOUR RECENT LAB RESULTS:  CD4 and VIRAL LOAD  Lab Results   Component Value Date    ACD4 737 05/02/2019    HIVRS Not Detected 05/02/2019

## 2019-08-08 LAB — HIV RNA, QUANTITATIVE, PCR: HIV RNA QNT RSLT: NOT DETECTED

## 2019-08-08 LAB — HIV RNA QNT RSLT: HIV 1 RNA:PrThr:Pt:Ser/Plas:Ord:Probe.amp.tar: NOT DETECTED

## 2019-08-08 MED ORDER — GABAPENTIN 600 MG TABLET
ORAL_TABLET | 0 refills | 0 days | Status: CP
Start: 2019-08-08 — End: ?

## 2019-08-08 NOTE — Unmapped (Signed)
Emailed PT referral to pt at request of provider Lahoma Rocker, PA. Secure, encrypted email was used.

## 2019-08-10 DIAGNOSIS — R413 Other amnesia: Principal | ICD-10-CM

## 2019-08-21 DIAGNOSIS — Z1152 Encounter for screening for COVID-19: Secondary | ICD-10-CM | POA: Diagnosis not present

## 2019-08-21 DIAGNOSIS — B349 Viral infection, unspecified: Secondary | ICD-10-CM | POA: Diagnosis not present

## 2019-08-27 DIAGNOSIS — M5431 Sciatica, right side: Secondary | ICD-10-CM | POA: Diagnosis not present

## 2019-08-27 DIAGNOSIS — M5432 Sciatica, left side: Secondary | ICD-10-CM | POA: Diagnosis not present

## 2019-08-27 DIAGNOSIS — M545 Low back pain: Secondary | ICD-10-CM | POA: Diagnosis not present

## 2019-08-29 ENCOUNTER — Encounter: Admit: 2019-08-29 | Discharge: 2019-08-30 | Payer: PRIVATE HEALTH INSURANCE

## 2019-08-29 DIAGNOSIS — B20 Human immunodeficiency virus [HIV] disease: Secondary | ICD-10-CM | POA: Diagnosis not present

## 2019-08-29 DIAGNOSIS — N189 Chronic kidney disease, unspecified: Secondary | ICD-10-CM | POA: Diagnosis not present

## 2019-08-29 LAB — CBC
HEMATOCRIT: 46.8 % (ref 41.0–53.0)
HEMOGLOBIN: 14.4 g/dL (ref 13.5–17.5)
MEAN CORPUSCULAR HEMOGLOBIN CONC: 30.8 g/dL — ABNORMAL LOW (ref 31.0–37.0)
MEAN PLATELET VOLUME: 10.1 fL — ABNORMAL HIGH (ref 7.0–10.0)
PLATELET COUNT: 231 10*9/L (ref 150–440)
RED CELL DISTRIBUTION WIDTH: 13.9 % (ref 12.0–15.0)
WBC ADJUSTED: 8.1 10*9/L (ref 4.5–11.0)

## 2019-08-29 LAB — MEAN CORPUSCULAR HEMOGLOBIN CONC: Erythrocyte mean corpuscular hemoglobin concentration:MCnc:Pt:RBC:Qn:Automated count: 30.8 — ABNORMAL LOW

## 2019-08-30 DIAGNOSIS — M5431 Sciatica, right side: Secondary | ICD-10-CM | POA: Diagnosis not present

## 2019-08-30 DIAGNOSIS — M5432 Sciatica, left side: Secondary | ICD-10-CM | POA: Diagnosis not present

## 2019-08-30 DIAGNOSIS — M545 Low back pain: Secondary | ICD-10-CM | POA: Diagnosis not present

## 2019-08-30 LAB — URINALYSIS
BACTERIA: NONE SEEN /HPF
BILIRUBIN UA: NEGATIVE
BLOOD UA: NEGATIVE
GLUCOSE UA: NEGATIVE
HYALINE CASTS: 4 /LPF — ABNORMAL HIGH (ref 0–1)
KETONES UA: NEGATIVE
LEUKOCYTE ESTERASE UA: NEGATIVE
PH UA: 5 (ref 5.0–9.0)
PROTEIN UA: NEGATIVE
RBC UA: <1 /HPF (ref <=3)
SPECIFIC GRAVITY UA: 1.03 (ref 1.003–1.030)
SQUAMOUS EPITHELIAL: <1 /HPF (ref 0–5)
UROBILINOGEN UA: 0.2
WBC UA: 1 /HPF (ref <=2)

## 2019-08-30 LAB — SPECIFIC GRAVITY UA: Specific gravity:Rden:Pt:Urine:Qn:: 1.03

## 2019-08-30 NOTE — Unmapped (Signed)
Received message from [facility] office that they weren't able to make payment for insurance premium for pt's health insurance. They asked me to reach out to pts and verify some information. Called pt and left him a voicemail letting him know that I was contacting him re: important information. Since pt in the past has communicated via email, sent him an email message asking that he give me a call when he gets a chance.     Sherene Sires  Benefits Coordinator ID Clinic    Time Duration of intervention in minutes: 5 mins

## 2019-09-02 NOTE — Unmapped (Signed)
Called pt to let him know that [facility] wasn't able to make a payment for insurance due to demographic information not matching. They asked me to call him and verify demographic information. Pt verified it. Sent this to the the PCAP program via secure email    Sherene Sires  Benefits Coordinator [facility]    Time Duration of intervention in minutes: 5 mins

## 2019-09-04 DIAGNOSIS — M5432 Sciatica, left side: Secondary | ICD-10-CM | POA: Diagnosis not present

## 2019-09-04 DIAGNOSIS — M545 Low back pain: Secondary | ICD-10-CM | POA: Diagnosis not present

## 2019-09-04 DIAGNOSIS — M5431 Sciatica, right side: Secondary | ICD-10-CM | POA: Diagnosis not present

## 2019-09-06 DIAGNOSIS — M5431 Sciatica, right side: Secondary | ICD-10-CM | POA: Diagnosis not present

## 2019-09-06 DIAGNOSIS — M545 Low back pain: Secondary | ICD-10-CM | POA: Diagnosis not present

## 2019-09-06 DIAGNOSIS — M5432 Sciatica, left side: Secondary | ICD-10-CM | POA: Diagnosis not present

## 2019-09-11 DIAGNOSIS — M5432 Sciatica, left side: Secondary | ICD-10-CM | POA: Diagnosis not present

## 2019-09-11 DIAGNOSIS — M5431 Sciatica, right side: Secondary | ICD-10-CM | POA: Diagnosis not present

## 2019-09-11 DIAGNOSIS — M545 Low back pain: Secondary | ICD-10-CM | POA: Diagnosis not present

## 2019-09-13 MED ORDER — GABAPENTIN 600 MG TABLET
ORAL_TABLET | 0 refills | 0 days | Status: CP
Start: 2019-09-13 — End: ?

## 2019-09-16 DIAGNOSIS — F1020 Alcohol dependence, uncomplicated: Principal | ICD-10-CM

## 2019-09-16 MED ORDER — NALTREXONE 50 MG TABLET
ORAL_TABLET | Freq: Every day | ORAL | 3 refills | 30 days | Status: CP
Start: 2019-09-16 — End: 2020-09-15

## 2019-09-18 DIAGNOSIS — M5432 Sciatica, left side: Secondary | ICD-10-CM | POA: Diagnosis not present

## 2019-09-18 DIAGNOSIS — M545 Low back pain: Secondary | ICD-10-CM | POA: Diagnosis not present

## 2019-09-18 DIAGNOSIS — M5431 Sciatica, right side: Secondary | ICD-10-CM | POA: Diagnosis not present

## 2019-09-20 ENCOUNTER — Encounter (INDEPENDENT_AMBULATORY_CARE_PROVIDER_SITE_OTHER): Payer: Self-pay

## 2019-09-20 DIAGNOSIS — M5432 Sciatica, left side: Secondary | ICD-10-CM | POA: Diagnosis not present

## 2019-09-20 DIAGNOSIS — M545 Low back pain: Secondary | ICD-10-CM | POA: Diagnosis not present

## 2019-09-20 DIAGNOSIS — M5431 Sciatica, right side: Secondary | ICD-10-CM | POA: Diagnosis not present

## 2019-09-27 DIAGNOSIS — M545 Low back pain: Secondary | ICD-10-CM | POA: Diagnosis not present

## 2019-09-27 DIAGNOSIS — M5431 Sciatica, right side: Secondary | ICD-10-CM | POA: Diagnosis not present

## 2019-09-27 DIAGNOSIS — M5432 Sciatica, left side: Secondary | ICD-10-CM | POA: Diagnosis not present

## 2019-10-01 DIAGNOSIS — M5432 Sciatica, left side: Secondary | ICD-10-CM | POA: Diagnosis not present

## 2019-10-01 DIAGNOSIS — M545 Low back pain: Secondary | ICD-10-CM | POA: Diagnosis not present

## 2019-10-01 DIAGNOSIS — M5431 Sciatica, right side: Secondary | ICD-10-CM | POA: Diagnosis not present

## 2019-10-02 ENCOUNTER — Institutional Professional Consult (permissible substitution)
Admit: 2019-10-02 | Discharge: 2019-10-03 | Payer: PRIVATE HEALTH INSURANCE | Attending: Family Medicine | Primary: Family Medicine

## 2019-10-02 ENCOUNTER — Encounter: Payer: Self-pay | Admitting: Emergency Medicine

## 2019-10-02 ENCOUNTER — Emergency Department: Payer: BLUE CROSS/BLUE SHIELD

## 2019-10-02 ENCOUNTER — Other Ambulatory Visit: Payer: Self-pay

## 2019-10-02 ENCOUNTER — Emergency Department
Admission: EM | Admit: 2019-10-02 | Discharge: 2019-10-02 | Disposition: A | Discharge status: Home / Self Care | Payer: BLUE CROSS/BLUE SHIELD | Attending: Emergency Medicine | Admitting: Emergency Medicine

## 2019-10-02 DIAGNOSIS — F17210 Nicotine dependence, cigarettes, uncomplicated: Secondary | ICD-10-CM | POA: Insufficient documentation

## 2019-10-02 DIAGNOSIS — R0602 Shortness of breath: Secondary | ICD-10-CM | POA: Diagnosis not present

## 2019-10-02 DIAGNOSIS — Z8673 Personal history of transient ischemic attack (TIA), and cerebral infarction without residual deficits: Secondary | ICD-10-CM | POA: Insufficient documentation

## 2019-10-02 DIAGNOSIS — Z7982 Long term (current) use of aspirin: Secondary | ICD-10-CM | POA: Insufficient documentation

## 2019-10-02 DIAGNOSIS — R600 Localized edema: Secondary | ICD-10-CM | POA: Diagnosis not present

## 2019-10-02 DIAGNOSIS — Z21 Asymptomatic human immunodeficiency virus [HIV] infection status: Secondary | ICD-10-CM | POA: Insufficient documentation

## 2019-10-02 DIAGNOSIS — Z79899 Other long term (current) drug therapy: Secondary | ICD-10-CM | POA: Diagnosis not present

## 2019-10-02 DIAGNOSIS — R609 Edema, unspecified: Secondary | ICD-10-CM | POA: Diagnosis not present

## 2019-10-02 DIAGNOSIS — I10 Essential (primary) hypertension: Secondary | ICD-10-CM | POA: Insufficient documentation

## 2019-10-02 LAB — BASIC METABOLIC PANEL
Anion gap: 11 (ref 5–15)
BUN: 21 mg/dL — ABNORMAL HIGH (ref 6–20)
CO2: 25 mmol/L (ref 22–32)
Calcium: 9.5 mg/dL (ref 8.9–10.3)
Chloride: 100 mmol/L (ref 98–111)
Creatinine, Ser: 1.27 mg/dL — ABNORMAL HIGH (ref 0.61–1.24)
GFR calc Af Amer: >60 mL/min (ref >60)
GFR calc non Af Amer: >60 mL/min (ref >60)
Glucose, Bld: 110 mg/dL — ABNORMAL HIGH (ref 70–99)
Potassium: 3.9 mmol/L (ref 3.5–5.1)
Sodium: 136 mmol/L (ref 135–145)

## 2019-10-02 LAB — CBC
HCT: 43 % (ref 39.0–52.0)
Hemoglobin: 13.9 g/dL (ref 13.0–17.0)
MCH: 30.1 pg (ref 26.0–34.0)
MCHC: 32.3 g/dL (ref 30.0–36.0)
MCV: 93.1 fL (ref 80.0–100.0)
Platelets: 196 10*3/uL (ref 150–400)
RBC: 4.62 MIL/uL (ref 4.22–5.81)
RDW: 13.5 % (ref 11.5–15.5)
WBC: 6.7 10*3/uL (ref 4.0–10.5)
nRBC: 0 % (ref 0.0–0.2)

## 2019-10-02 LAB — BRAIN NATRIURETIC PEPTIDE: B Natriuretic Peptide: 9 pg/mL (ref 0.0–100.0)

## 2019-10-02 LAB — TROPONIN I (HIGH SENSITIVITY): Troponin I (High Sensitivity): 3 ng/L (ref <18)

## 2019-10-02 LAB — FIBRIN DERIVATIVES D-DIMER (ARMC ONLY): Fibrin derivatives D-dimer (ARMC): 387.72 ng/mL (FEU) (ref 0.00–499.00)

## 2019-10-02 MED ORDER — FUROSEMIDE 40 MG PO TABS
40.0000 mg | ORAL_TABLET | Freq: Once | ORAL | Status: AC
  Administered 2019-10-02: 22:00:00 40 mg via ORAL
  Filled 2019-10-02: qty 1

## 2019-10-02 MED ORDER — FUROSEMIDE 20 MG PO TABS: 20.0000 mg | ORAL_TABLET | Freq: Every day | ORAL | 0 refills | Status: DC

## 2019-10-02 NOTE — ED Notes (Signed)
MD at bedside with US machine

## 2019-10-02 NOTE — ED Notes (Signed)
Patient states he has experienced leg swelling for 3 weeks and SOB for the last 3 days that occurs with exertion. Patient states he does not feel SOB sitting. Patient was SOB when nurse originally went into room but patient reports attempting to do exercises in the room when he arrived. Patient was assisted to position of comfort and connected to monitor

## 2019-10-02 NOTE — ED Provider Notes (Signed)
[facility] Emergency Department Provider Note   ____________________________________________   First MD Initiated Contact with Patient 10/02/19 2122     (approximate)  I have reviewed the triage vital signs and the nursing notes.   HISTORY  Chief Complaint Leg Swelling and Shortness of Breath    HPI Daryl Shelton is a 59 y.o. male for evaluation of leg swelling and shortness of breath  Patient reports increased swelling starting in both ankles and up into his mid legs developing over the last 3 weeks.  He reports he is felt he is gained about 50 pounds but that is occurred over 2 years  He gets short of breath with walking.  No chest pain.  No nausea or vomiting.  No recent fevers chills or illness.  Denies Covid.  Does have a history of HIV, but reports he is compliant with all of his medications and it does appear he follows regularly in the [facility]  Reports no history of known heart problems or liver disease except his primary doctor told him in med but and that they were concerned he may be high risk of liver disease due to his heavy drinking habits which she reports he has cut back now to only about 10% of the alcohol he used to use.  He used to drink upwards of half gallon of vodka every few days.  Past Medical History:  Diagnosis Date  . Anxiety   . Bell's palsy   . Depression   . HIV (human immunodeficiency virus infection) ([place])   . Hypertension     Patient Active Problem List   Diagnosis Date Noted  . TIA (transient ischemic attack) 05/04/2016    History reviewed. No pertinent surgical history.  Prior to Admission medications   Medication Sig Start Date End Date Taking? Authorizing Provider  aspirin EC 81 MG tablet Take 81 mg by mouth daily.    [provider]  bictegravir-emtricitabine-tenofovir AF (BIKTARVY) 50-200-25 MG TABS tablet Take by mouth. 09/13/17   [provider]  furosemide (LASIX) 20 MG tablet  Take 1 tablet (20 mg total) by mouth daily for 5 days. 10/02/19 10/07/19  Delman Kitten, MD  gabapentin (NEURONTIN) 600 MG tablet Take 600 mg by mouth 3 (three) times daily.    [provider]  hydrochlorothiazide (HYDRODIURIL) 25 MG tablet Take 25 mg by mouth daily.    [provider]  HYDROcodone-homatropine (HYCODAN) 5-1.5 MG/5ML syrup Take 5 mLs by mouth every 6 (six) hours as needed. 10/10/18   Coral Spikes, DO  lisinopril (PRINIVIL,ZESTRIL) 40 MG tablet Take 40 mg by mouth daily.    [provider]  mirtazapine (REMERON) 30 MG tablet Take 30 mg by mouth at bedtime.    [provider]  omeprazole (PRILOSEC) 20 MG capsule Take 20 mg by mouth daily.    [provider]  oseltamivir (TAMIFLU) 75 MG capsule Take 1 capsule (75 mg total) by mouth every 12 (twelve) hours. 10/10/18   Coral Spikes, DO  pravastatin (PRAVACHOL) 40 MG tablet Take 40 mg by mouth daily.    [provider]  sertraline (ZOLOFT) 100 MG tablet Take 200 mg by mouth daily.    [provider]    Allergies Varenicline and Pollen extract  Family History  Problem Relation Age of Onset  . CAD Father     Social History Social History   Tobacco Use  . Smoking status: Current Every Day Smoker    Packs/day:  0.50    Types: Cigarettes  . Smokeless tobacco: Never Used  Substance Use Topics  . Alcohol use: Yes  . Drug use: No    Review of Systems Constitutional: No fever/chills Eyes: No visual changes. ENT: No sore throat. Cardiovascular: Denies chest pain. Respiratory: Mild shortness of breath specially with walking Gastrointestinal: No abdominal pain.   Genitourinary: Negative for dysuria. Musculoskeletal: Negative for back pain. Skin: Negative for rash. Neurological: Negative for headaches, areas of focal weakness or numbness.    ____________________________________________   PHYSICAL EXAM:  VITAL SIGNS: ED Triage Vitals  Enc Vitals Group     BP  10/02/19 1758 130/85     Pulse Rate 10/02/19 1758 100     Resp 10/02/19 1758 18     Temp 10/02/19 1758 98.7 F (37.1 C)     Temp Source 10/02/19 1758 Oral     SpO2 10/02/19 1758 97 %     Weight 10/02/19 1759 250 lb (113.4 kg)     Height 10/02/19 1759 5\' 9"  (1.753 m)     Head Circumference --      Peak Flow --      Pain Score 10/02/19 1759 2     Pain Loc --      Pain Edu? --      Excl. in [place]? --     Constitutional: Alert and oriented. Well appearing and in no acute distress. Eyes: Conjunctivae are normal. Head: Atraumatic.  No JVD. Nose: No congestion/rhinnorhea. Mouth/Throat: Mucous membranes are moist. Neck: No stridor.  Cardiovascular: Normal rate, regular rhythm. Grossly normal heart sounds.  Good peripheral circulation. Respiratory: Normal respiratory effort.  No retractions. Lungs CTAB. Gastrointestinal: Soft and nontender. No distention.  No fluid wave.  No protuberant umbilicus or spider veins. Musculoskeletal: No lower extremity tenderness or erythema, but does have 3+ pitting edema in the bilateral lower extremities. Neurologic:  Normal speech and language. No gross focal neurologic deficits are appreciated.  Skin:  Skin is warm, dry and intact. No rash noted. Psychiatric: Mood and affect are normal. Speech and behavior are normal.  ____________________________________________   LABS (all labs ordered are listed, but only abnormal results are displayed)  Labs Reviewed  BASIC METABOLIC PANEL - Abnormal; Notable for the following components:      Result Value   Glucose, Bld 110 (*)    BUN 21 (*)    Creatinine, Ser 1.27 (*)    All other components within normal limits  CBC  BRAIN NATRIURETIC PEPTIDE  FIBRIN DERIVATIVES D-DIMER ([facility] ONLY)  TROPONIN I (HIGH SENSITIVITY)   ____________________________________________  EKG  ED ECG REPORT I, Delman Kitten, the attending physician, personally viewed and interpreted this ECG.  Date: 10/02/2019 EKG Time:  1811 Rate: 96 Rhythm: normal sinus rhythm QRS Axis: normal Intervals: normal ST/T Wave abnormalities: normal Narrative Interpretation: no evidence of acute ischemia  ____________________________________________  RADIOLOGY  DG Chest 2 View  Result Date: 10/02/2019 CLINICAL DATA:  Shortness of breath.  Bilateral leg swelling. EXAM: CHEST - 2 VIEW COMPARISON:  None. FINDINGS: Heart size is normal. Mild aortic tortuosity. The lungs are clear. The vascularity is normal. Incidental 4-5 mm granuloma left lung base. No effusions. Ordinary degenerative changes affect the spine. IMPRESSION: No active disease. No plain radiographic evidence of heart failure or fluid overload. Electronically Signed   By: Nelson Chimes M.D.   On: 10/02/2019 18:50    Chest x-ray reviewed by me, negative for acute   ____________________________________________   PROCEDURES  Procedure(s) performed: None  Procedures  Critical Care performed: No  ____________________________________________   INITIAL IMPRESSION / ASSESSMENT AND PLAN / ED COURSE  Pertinent labs & imaging results that were available during my care of the patient were reviewed by me and considered in my medical decision making (see chart for details).   Patient was for evaluation for evaluation of increasing lower extremity edema bilateral.  He has notable about 3+ pitting edema in lower extremities bilateral without evidence of cellulitic changes.  Patient has notable peripheral edema.  Etiology somewhat unclear, but he does report a history of heavy alcohol use drinking upwards of a half a gallon of vodka every few days for several years.  Additionally, he does have a known history of HIV, hypertension, and is followed by primary care and medicine who also told him recently they were concerned about possible liver disease no bedside evaluation does not demonstrate ascites by clinical examination.  I did perform a bedside FAST exam and this was  negative for free fluid.  He does report also some shortness of breath, however he does not appear hypoxic his work of breathing is normal his lung sounds are clear chest x-ray reassuring.  BNP is also not elevated at this time.  Will further evaluate as to cause, obtain D-dimer to evaluate for PE, also obtain ultrasound to evaluate for DVTs lower extremities bilateral  Discussed with the patient, given I suspect this is likely peripheral edema will trial use of Lasix short course and I recommended close follow-up with his primary and [facility] as well as set up a close cardiology follow-up.  Denies infectious symptoms.  Is compliant with his HIV medications.    ----------------------------------------- 11:25 PM on 10/02/2019 -----------------------------------------  Patient urinating well.  Comfortable plan to go home with furosemide if ultrasound negative for blood clots.  Dr. Cherylann Banas will follow up on results of ultrasound study, if negative for DVT would anticipate discharge  Return precautions and treatment recommendations and follow-up discussed with the patient who is agreeable with the plan.   ____________________________________________   FINAL CLINICAL IMPRESSION(S) / ED DIAGNOSES  Final diagnoses:  Peripheral edema        Note:  This document was prepared using Dragon voice recognition software and may include unintentional dictation errors       Delman Kitten, MD 10/02/19 2326

## 2019-10-02 NOTE — ED Notes (Signed)
Pt to US.

## 2019-10-02 NOTE — ED Triage Notes (Addendum)
Pt in via POV, was unable to get in w/ PCP today and was advised to be evaluated here.  Pt reports bilateral leg swelling x approximately 3 weeks w/ new onset shortness of breath on exertion over the last few days.  Ambulatory to triage, vitals WDL, NAD noted at this time.

## 2019-10-02 NOTE — ED Notes (Signed)
Patient states that his SOB could be due to his anxiety he experiences

## 2019-10-03 NOTE — Unmapped (Signed)
Patient ID: Larry Montes is a 59 y.o. male who presents for an office visit.      ASSESSMENT and PLAN:    Diagnoses and all orders for this visit:    Edema of both legs    Rec go to ER for further eval/tx to r/o DVT      Medication adherence and barriers to the treatment plan have been addressed. Opportunities to optimize healthy behaviors have been discussed. Patient / caregiver voiced understanding.      No follow-ups on file.    SUBJECTIVE:    Larry Montes is a 59 y.o. male that phones in today for 3 weeks of swelling in both legs with recent onset of dyspnea.  No previous history of DVT.    He current medications include has a current medication list which includes the following prescription(s): aspirin, atorvastatin, biktarvy, gabapentin, hydrochlorothiazide, lisinopril, mirtazapine, naltrexone, nicotine, omeprazole, and sertraline.    I have reviewed past medical, surgical, medication, allergy, social and family histories today and updated them in EPIC where appropriate.    Review of systems negative or as noted in HPI.      OBJECTIVE:                                                                                                                          Physical Exam: There were no vitals filed for this visit.  Gen: Pleasant    POC testing and recent labs:  Results for orders placed or performed in visit on 08/29/19   CBC   Result Value Ref Range    WBC 8.1 4.5 - 11.0 10*9/L    RBC 4.73 4.50 - 5.90 10*12/L    HGB 14.4 13.5 - 17.5 g/dL    HCT 16.1 09.6 - 04.5 %    MCV 98.9 80.0 - 100.0 fL    MCH 30.4 26.0 - 34.0 pg    MCHC 30.8 (L) 31.0 - 37.0 g/dL    RDW 40.9 81.1 - 91.4 %    MPV 10.1 (H) 7.0 - 10.0 fL    Platelet 231 150 - 440 10*9/L

## 2019-10-07 MED ORDER — OMEPRAZOLE 20 MG CAPSULE,DELAYED RELEASE
ORAL_CAPSULE | 3 refills | 0 days | Status: CP
Start: 2019-10-07 — End: ?

## 2019-10-07 MED ORDER — MIRTAZAPINE 30 MG TABLET
ORAL_TABLET | 5 refills | 0 days | Status: CP
Start: 2019-10-07 — End: ?

## 2019-10-07 NOTE — Unmapped (Signed)
I called pt. to remind him of the appt. on 10/09/19 at 1500. LM for pt. I asked pt. to arrive at 1430. Pt. reminded that no driver is required and that it is OK to eat before the appt. Pt. advised that if there are s/s of illness or if on antibiotics for any reason, the appt. will need to be rescheduled. I went through  the travel screening questions. Asked pt. to call back with questions/concerns. Left our phone #.

## 2019-10-08 DIAGNOSIS — M5432 Sciatica, left side: Secondary | ICD-10-CM | POA: Diagnosis not present

## 2019-10-08 DIAGNOSIS — M545 Low back pain: Secondary | ICD-10-CM | POA: Diagnosis not present

## 2019-10-08 DIAGNOSIS — M5431 Sciatica, right side: Secondary | ICD-10-CM | POA: Diagnosis not present

## 2019-10-08 DIAGNOSIS — M5412 Radiculopathy, cervical region: Secondary | ICD-10-CM | POA: Diagnosis not present

## 2019-10-09 ENCOUNTER — Encounter: Admit: 2019-10-09 | Discharge: 2019-10-09 | Payer: PRIVATE HEALTH INSURANCE

## 2019-10-09 ENCOUNTER — Encounter
Admit: 2019-10-09 | Discharge: 2019-10-09 | Payer: PRIVATE HEALTH INSURANCE | Attending: Physical Medicine & Rehabilitation | Primary: Physical Medicine & Rehabilitation

## 2019-10-09 DIAGNOSIS — M47817 Spondylosis without myelopathy or radiculopathy, lumbosacral region: Secondary | ICD-10-CM | POA: Diagnosis not present

## 2019-10-09 NOTE — Unmapped (Signed)
Procedure complete. Fluoro time = 8 sec. Bandage applied by RN.

## 2019-10-09 NOTE — Unmapped (Signed)
Assessment/Plan:    Larry Montes was seen today for er follow up.    Diagnoses and all orders for this visit:    Leg edema  -     furosemide (LASIX) 20 MG tablet; 20 mg - 1 tablet QOD    SOB (shortness of breath)    ER follow up visit for LE edema and SOB: He was evaluated on 10/03/2019. work-up included negative chest x-ray, EKG with no evidence of acute ischemia, normal proBNP, negative troponin, and negative lower extremity venous Doppler for DVT.  BMP revealed normal potassium, glucose of 110, creatinine of 1.2.  Patient was started on a trial of Lasix and recommended he follow-up with his PCP as well as cardiology.  He currently is not followed by cardiology but he was referred and  scheduled for appt with [facility] cardiology next week.  Pt states Lasix has worked a bit with the swelling and mild edema is noted on exam today with clear lung exam. He may continue Lasix at 20 mg every other day until cardiology evaluation.  Edema may be due to venous insuff.   He is advised to limit his salt intake and get more exercise.    He has a routine follow up visit with me next month to address his other medical conditions as described below.        Return if symptoms worsen or fail to improve; pt has a follow up in 11/2019.    Subjective:     HPI  The patient is seen today for [facility] ER follow-up visit for evaluation of leg swelling and SOB.  He was evaluated on 10/03/2019. work-up included negative chest x-ray, EKG with no evidence of acute ischemia, normal proBNP, negative troponin, and negative lower extremity venous Doppler for DVT.  BMP revealed normal potassium, glucose of 110, creatinine of 1.2.  Patient was started on a trial of Lasix and recommended he follow-up with his PCP as well as cardiology.  He currently is not followed by cardiology but would benefit from screening echocardiogram. He is scheduled for appt with [facility] cardiology next week.  Pt states Lasix worked a bit.     I last saw the patient in December/2020.  His HPI is as follows:  Depression, unspecified depression type  Human immunodeficiency virus (HIV) disease   Essential hypertension  Tobacco use  Alcohol abuse, daily use  Mixed hyperlipidemia  Elevated LFTs  Hepatomegaly  Fatty liver  Obesity (BMI 30-39.9)    He was advised at that time as follows:  - History of alcohol abuse/elevated LFT's and hepatomegaly with fibrofatty liver changes noted on recent US: pt advised to reduce atorvastatin dose to 1/2 of a 40 mg tab daily along with continued reduction in alcohol consumption with Naltrexon on board. In addition obesity and increased abd girth is contributing to liver disease.  He is counseled on carb/sugar restriction to promote weight loss. He declined referral to [facility] GI/Liver clinic. He will need repeat liver US next summer.  Repeat LFT's done in 07/2019. He takes Naltrexone daily and this has helped him reduce his daily alcohol intake to 1-2 shots of Fireball a day.   - obesity: pt counseled on diet and exercise to promote weight loss. Screening TSH was normal in 04/2019.  - HTN: good control on present medication regimen. He is advised to start weekly home BP monitoring, low salt dietary intake and resume exercise routine. He had normal electrolytes, creatinine and glucose level in 04/2019.  More recent creatinine  done at ER visit revealed a mildly elevated creatinine of 1.27.  - dyslipidemia: pt had LDL of 42 inn 04/2019. He will reduced dose of lipitor as noted above in light of current liver disease and elevated LFT's.  Repeat LFTs in December/2020 were within normal limits.    - depression: stable on Remeron and zoloft as directed.  - tobacco use disorder: the pt is wearing a nicotine patch as directed and this has helped him with his smoking.  He is encouraged to reduce and ultimately stop smoking cigarettes.  - alcohol use disorder: pt will continue with naltrexone as directed and I stress importance of the pt to stop drinking alcohol.  - HIV- stable on current medication regimen. He will follow up with ID clinic as scheduled.  Return in about 4 months (around 11/21/2019) for follow up in 4 months and lab appt in 1 month.      ROS  Constitutional:  Denies  unexpected weight loss or gain, or weakness   Eyes:  Denies visual changes  Respiratory:  Denies cough or shortness of breath. No change in exercise  tolerance  Cardiovascular:  Denies chest pain, palpitations or lower extremity swelling   GI:  Denies abdominal pain, diarrhea, constipation   Musculoskeletal:  Denies myalgias  Skin:  Denies nonhealing lesions  Neurologic:  Denies headache, focal weakness or numbness, tingling  Endocrine:  Denies polyuria or polydypsia   Psychiatric:  Denies depression, anxiety      Outpatient Medications Prior to Visit   Medication Sig Dispense Refill   ??? aspirin (ECOTRIN) 81 MG tablet Take 1 tablet (81 mg total) by mouth daily. 90 each 2   ??? atorvastatin (LIPITOR) 40 MG tablet Take 1 tablet (40 mg total) by mouth daily. (Patient taking differently: Take 40 mg by mouth daily. 1/2 a tablet a day) 90 tablet 3   ??? bictegrav-emtricit-tenofov ala (BIKTARVY) 50-200-25 mg tablet Take 1 tablet by mouth daily. 90 tablet 5   ??? gabapentin (NEURONTIN) 600 MG tablet TAKE 1 AND 1/2 TABLETS(900 MG) BY MOUTH THREE TIMES DAILY 135 tablet 0   ??? hydroCHLOROthiazide (HYDRODIURIL) 25 MG tablet Take 1 tablet (25 mg total) by mouth daily. TAKE 1 TABLET(25 MG) BY MOUTH DAILY 90 tablet 2   ??? lisinopriL (PRINIVIL,ZESTRIL) 40 MG tablet Take 1 tablet (40 mg total) by mouth daily. 90 tablet 2   ??? mirtazapine (REMERON) 30 MG tablet TAKE 1 TABLET(30 MG) BY MOUTH EVERY NIGHT 30 tablet 5   ??? naltrexone (DEPADE) 50 mg tablet Take 1 tablet (50 mg total) by mouth daily. 30 tablet 3   ??? nicotine (NICODERM CQ) 21 mg/24 hr patch Place 1 patch on the skin daily. 28 patch 2   ??? omeprazole (PRILOSEC) 20 MG capsule TAKE 1 CAPSULE(20 MG) BY MOUTH DAILY 30 capsule 3   ??? sertraline (ZOLOFT) 100 MG tablet TAKE 2 TABLETS(200 MG) BY MOUTH DAILY 60 tablet 5   ??? furosemide (LASIX) 20 MG tablet TAKE 1 TABLET BY MOUTH ONCE DAILY FOR 5 DAYS       No facility-administered medications prior to visit.          Objective:       Vital Signs  BP 110/70  - Pulse 90  - Temp 37 ??C (98.6 ??F)  - Ht 177.8 cm (5' 10)  - Wt (!) 116.6 kg (257 lb)  - SpO2 98%  - BMI 36.88 kg/m??      Exam  General: normal appearance  EYES: Anicteric  sclerae.  ENT: Oropharynx moist.  RESP: Relaxed respiratory effort. Clear to auscultation without wheezes or crackles.   CV: Regular rate and rhythm. Normal S1 and S2. No murmurs or gallops.  Mild LE edema noted: Posterior tibial pulses are 2+ and symmetric.  abd exam: non tender, no masses, no HSM   MSK: No focal muscle tenderness.  SKIN: Appropriately warm and moist.  NEURO: Stable gait and coordination.    Allergies:     Chantix [varenicline] and Pollen extracts    Current Medications:     Current Outpatient Medications   Medication Sig Dispense Refill   ??? aspirin (ECOTRIN) 81 MG tablet Take 1 tablet (81 mg total) by mouth daily. 90 each 2   ??? atorvastatin (LIPITOR) 40 MG tablet Take 1 tablet (40 mg total) by mouth daily. (Patient taking differently: Take 40 mg by mouth daily. 1/2 a tablet a day) 90 tablet 3   ??? bictegrav-emtricit-tenofov ala (BIKTARVY) 50-200-25 mg tablet Take 1 tablet by mouth daily. 90 tablet 5   ??? furosemide (LASIX) 20 MG tablet 20 mg - 1 tablet QOD 30 tablet 1   ??? gabapentin (NEURONTIN) 600 MG tablet TAKE 1 AND 1/2 TABLETS(900 MG) BY MOUTH THREE TIMES DAILY 135 tablet 0   ??? hydroCHLOROthiazide (HYDRODIURIL) 25 MG tablet Take 1 tablet (25 mg total) by mouth daily. TAKE 1 TABLET(25 MG) BY MOUTH DAILY 90 tablet 2   ??? lisinopriL (PRINIVIL,ZESTRIL) 40 MG tablet Take 1 tablet (40 mg total) by mouth daily. 90 tablet 2   ??? mirtazapine (REMERON) 30 MG tablet TAKE 1 TABLET(30 MG) BY MOUTH EVERY NIGHT 30 tablet 5   ??? naltrexone (DEPADE) 50 mg tablet Take 1 tablet (50 mg total) by mouth daily. 30 tablet 3 ??? nicotine (NICODERM CQ) 21 mg/24 hr patch Place 1 patch on the skin daily. 28 patch 2   ??? omeprazole (PRILOSEC) 20 MG capsule TAKE 1 CAPSULE(20 MG) BY MOUTH DAILY 30 capsule 3   ??? sertraline (ZOLOFT) 100 MG tablet TAKE 2 TABLETS(200 MG) BY MOUTH DAILY 60 tablet 5     No current facility-administered medications for this visit.            Note - This record has been created using [employer]. Chart creation errors have been sought, but may not always have been located. Such creation errors do not reflect on the standard of medical care.    Jenell Milliner, MD

## 2019-10-09 NOTE — Unmapped (Signed)
PROCEDURE: Right L5/S1 facet joint injections under fluoroscopy  INDICATION: lumbar spondylosis  PRE-PROCEDURE CHECKLIST:  - Conservative Measures Tried: physical therapy, medications (NSAIDS, muscle relaxants, or neuropathic pain agents)  - Allergies: no allergy to iodine, seafood, shellfish, anesthetics, steroid  - Infection Status: no active infection and not on antibiotics within the last 2 weeks  - Anticoagulation: none or if on anticoagulation, held for appropriate amount of time with primary prescriber's consent and directions for stop and start protocol provided to patient  - Pregnancy Status: not pregnant or not applicable (i.e. male)  - Risks Discussed:  minor or major medication reaction, bleeding, infection, death, paralysis, nerve injury, increased pain, no change in pain, other unforseen complications  - Benefits Discussed: potential pain reduction and improvement in function  - Alternatives Discussed: not doing the procedure, continuing with conservative measures and/or medical management  TECHNIQUE:  After confirming written and informed consent discussing risks of bleeding, infection, nerve injury, medication reaction, and alternative of not doing the procedure, the patient was brought to the procedure room and placed in the prone position. BP, HR, O2SAT, visual/verbal monitoring was established. A TIME OUT was performed. The patient did not require sedation for the procedure. Using universal sterile precautions, procedure equipment and medications were prepared and skin was prepped with chlorhexidine swab X 2 then draped.  The above indicated facet level(s) was identified in AP view. The fluoroscope was then positioned to provide an ipsilateral oblique view of the joint. Skin was anesthetized using 3ml of 1% lidocaine. A 22 gauge 3.5 inch needle was directed toward the target using intermittent fluoroscopic guidance. Final position was confirmed with multiplanar AP and lateral views. A small amount of contrast dye was injected with appropriate dye pattern and no vascular uptake. The facet joint was then injected with 5 mg dexamethasone and 0.5 cc of 1% lidocaine for a total injectate volume of 1 cc.  Negative aspiration was noted prior to each injection. No paresthesias were reported with needle placement.   The above was repeated for each of the facet levels identified above.  Vital signs remained stable throughout the procedure. The needle(s) were removed intact and a bandage was applied. The patient was transferred to the recovery area. The patient was monitored, reassessed and discharged after an appropriate observatory period.  COMPLICATIONS: The patient tolerated the procedure well and there were no complications.  PRE-PROCEDURE PAIN PAIN SCORE: 1  PRE-PROCEDURE PAIN PAIN SCORE:  0  POST-PROCEDURE EXAM: Marked reduction in pain with functional improvement in lumbar spine ROM with ability to extend pain free. No new neuromotor compromise.

## 2019-10-11 DIAGNOSIS — M5431 Sciatica, right side: Secondary | ICD-10-CM | POA: Diagnosis not present

## 2019-10-11 DIAGNOSIS — M545 Low back pain: Secondary | ICD-10-CM | POA: Diagnosis not present

## 2019-10-11 DIAGNOSIS — M5432 Sciatica, left side: Secondary | ICD-10-CM | POA: Diagnosis not present

## 2019-10-14 ENCOUNTER — Encounter: Admit: 2019-10-14 | Discharge: 2019-10-15 | Payer: PRIVATE HEALTH INSURANCE

## 2019-10-14 DIAGNOSIS — Z6836 Body mass index (BMI) 36.0-36.9, adult: Secondary | ICD-10-CM | POA: Diagnosis not present

## 2019-10-14 DIAGNOSIS — R0602 Shortness of breath: Secondary | ICD-10-CM | POA: Diagnosis not present

## 2019-10-14 DIAGNOSIS — R600 Localized edema: Secondary | ICD-10-CM | POA: Diagnosis not present

## 2019-10-14 DIAGNOSIS — B20 Human immunodeficiency virus [HIV] disease: Principal | ICD-10-CM

## 2019-10-14 MED ORDER — FUROSEMIDE 20 MG TABLET
ORAL_TABLET | 1 refills | 0 days | Status: CP
Start: 2019-10-14 — End: ?

## 2019-10-14 MED ORDER — BIKTARVY 50 MG-200 MG-25 MG TABLET
ORAL_TABLET | Freq: Every day | ORAL | 5 refills | 90 days | Status: CP
Start: 2019-10-14 — End: ?

## 2019-10-16 MED ORDER — GABAPENTIN 600 MG TABLET
ORAL_TABLET | Freq: Three times a day (TID) | ORAL | 2 refills | 30 days | Status: CP
Start: 2019-10-16 — End: ?

## 2019-10-17 DIAGNOSIS — M545 Low back pain: Secondary | ICD-10-CM | POA: Diagnosis not present

## 2019-10-17 DIAGNOSIS — M5431 Sciatica, right side: Secondary | ICD-10-CM | POA: Diagnosis not present

## 2019-10-17 DIAGNOSIS — M5432 Sciatica, left side: Secondary | ICD-10-CM | POA: Diagnosis not present

## 2019-10-17 NOTE — Unmapped (Signed)
Called pt to discuss missing information from [facility] renewal application. Informed pt we are missing the information for his no/low income form. Reviewed w/pt over the phone, pt provided verbal consent.    Cherly Anderson   Benefits Coordinator, ID Clinic  Time of intervention: 8 mins

## 2019-10-18 NOTE — Unmapped (Signed)
A financial assessment was completed to determine eligibility for patient financial assistance programs.   Completed assessment received via mail    Household FPL: 99%  Individual FPL: 99%    Insurance  Patient has a Location manager through the [employer] ([email or web address]).    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 RW flag was set based on IPL for Caps on Charges.     HMAP/PCAP (Premium Copay Assistance program) eligibility:   RENEWAL APPLICATION. Patient has coverage with the PCAP sub-program for the current period and is eligible for medication assistance. The renewal application is pending review for the next coverage period. PCAP helps patient with insurance premium and medication copays.    Other PCAP sub-program details:   - Patient can fill their medications at any pharmacy within the health insurance plan pharmacy network.   -If the patient's insurance premium changes, the patient must provide an insurance billing statement showing this NEW premium amount (i.e. after open enrollment between Nov. & Dec. when the patient re-enrolls in a plan for the next year).     Sherene Sires  Benefits Coordinator ID Clinic    Time Duration of intervention in minutes: 15 mins

## 2019-10-22 DIAGNOSIS — M5412 Radiculopathy, cervical region: Secondary | ICD-10-CM | POA: Diagnosis not present

## 2019-10-22 DIAGNOSIS — M5432 Sciatica, left side: Secondary | ICD-10-CM | POA: Diagnosis not present

## 2019-10-22 DIAGNOSIS — M545 Low back pain: Secondary | ICD-10-CM | POA: Diagnosis not present

## 2019-10-22 DIAGNOSIS — M5431 Sciatica, right side: Secondary | ICD-10-CM | POA: Diagnosis not present

## 2019-10-24 DIAGNOSIS — I10 Essential (primary) hypertension: Secondary | ICD-10-CM | POA: Diagnosis not present

## 2019-10-24 DIAGNOSIS — E782 Mixed hyperlipidemia: Secondary | ICD-10-CM | POA: Insufficient documentation

## 2019-10-24 DIAGNOSIS — R600 Localized edema: Secondary | ICD-10-CM | POA: Insufficient documentation

## 2019-10-24 DIAGNOSIS — R0602 Shortness of breath: Secondary | ICD-10-CM | POA: Insufficient documentation

## 2019-10-24 DIAGNOSIS — M5412 Radiculopathy, cervical region: Secondary | ICD-10-CM | POA: Diagnosis not present

## 2019-10-24 DIAGNOSIS — M545 Low back pain: Secondary | ICD-10-CM | POA: Diagnosis not present

## 2019-10-24 DIAGNOSIS — M5431 Sciatica, right side: Secondary | ICD-10-CM | POA: Diagnosis not present

## 2019-10-24 DIAGNOSIS — M5432 Sciatica, left side: Secondary | ICD-10-CM | POA: Diagnosis not present

## 2019-10-29 DIAGNOSIS — M545 Low back pain: Secondary | ICD-10-CM | POA: Diagnosis not present

## 2019-10-29 DIAGNOSIS — M5431 Sciatica, right side: Secondary | ICD-10-CM | POA: Diagnosis not present

## 2019-10-29 DIAGNOSIS — M5432 Sciatica, left side: Secondary | ICD-10-CM | POA: Diagnosis not present

## 2019-10-31 DIAGNOSIS — M5431 Sciatica, right side: Secondary | ICD-10-CM | POA: Diagnosis not present

## 2019-10-31 DIAGNOSIS — M545 Low back pain: Secondary | ICD-10-CM | POA: Diagnosis not present

## 2019-10-31 DIAGNOSIS — M5432 Sciatica, left side: Secondary | ICD-10-CM | POA: Diagnosis not present

## 2019-11-05 DIAGNOSIS — M5412 Radiculopathy, cervical region: Secondary | ICD-10-CM | POA: Diagnosis not present

## 2019-11-05 DIAGNOSIS — M5432 Sciatica, left side: Secondary | ICD-10-CM | POA: Diagnosis not present

## 2019-11-05 DIAGNOSIS — M5431 Sciatica, right side: Secondary | ICD-10-CM | POA: Diagnosis not present

## 2019-11-05 DIAGNOSIS — M545 Low back pain: Secondary | ICD-10-CM | POA: Diagnosis not present

## 2019-11-07 DIAGNOSIS — M545 Low back pain: Secondary | ICD-10-CM | POA: Diagnosis not present

## 2019-11-07 DIAGNOSIS — M5432 Sciatica, left side: Secondary | ICD-10-CM | POA: Diagnosis not present

## 2019-11-07 DIAGNOSIS — M5431 Sciatica, right side: Secondary | ICD-10-CM | POA: Diagnosis not present

## 2019-11-11 DIAGNOSIS — R0602 Shortness of breath: Secondary | ICD-10-CM | POA: Diagnosis not present

## 2019-11-13 MED ORDER — SERTRALINE 100 MG TABLET
ORAL_TABLET | 5 refills | 0 days | Status: CP
Start: 2019-11-13 — End: ?

## 2019-11-13 NOTE — Unmapped (Signed)
Refill request: Zoloft. Last vist: 08/06/19. Last labs: 08/06/19.

## 2019-11-14 DIAGNOSIS — M545 Low back pain: Secondary | ICD-10-CM | POA: Diagnosis not present

## 2019-11-14 DIAGNOSIS — M5431 Sciatica, right side: Secondary | ICD-10-CM | POA: Diagnosis not present

## 2019-11-14 DIAGNOSIS — M5432 Sciatica, left side: Secondary | ICD-10-CM | POA: Diagnosis not present

## 2019-11-19 DIAGNOSIS — M545 Low back pain: Secondary | ICD-10-CM | POA: Diagnosis not present

## 2019-11-19 DIAGNOSIS — M5432 Sciatica, left side: Secondary | ICD-10-CM | POA: Diagnosis not present

## 2019-11-19 DIAGNOSIS — M5431 Sciatica, right side: Secondary | ICD-10-CM | POA: Diagnosis not present

## 2019-11-19 NOTE — Unmapped (Signed)
Assessment/Plan:    Larry Montes was seen today for follow-up.    Diagnoses and all orders for this visit:    Essential hypertension  -     Lipid Panel    Human immunodeficiency virus (HIV) disease (CMS-HCC)    Alcohol use disorder, severe, dependence (CMS-HCC)    Mixed hyperlipidemia  -     Lipid Panel    Tobacco use  -     nicotine (NICODERM CQ) 21 mg/24 hr patch; Place 1 patch on the skin daily.    Leg edema      Follow of LE edema and SOB complaint: he has been evaluated by cardiology and this encounter and echocardiogram result reviewed with the pt today.  He is doing well since initiation of lasix which he will take prn. His BP remains normal on his current medication regimen and home BP monitoring.   He is due for repeat lipid panel today since his lipitor dose was decreased in light of elevated LFT's in the setting of fatty liver disease.  He had abd US done in 04/2019 and I will schedule a follow up US on next visit. He declined referral to [facility] GI[facility]liver clinic.  His recent LFT's are normal.  He has a follow up with cardiology next week and he will continue follow up with [facility] ID clinic as scheduled. He is given another Rx for nicoderm patch to resume since he continues in attempts on smoking cessation. He has reduced his daily alcohol intake as well.  His depression symptoms remain stable on current medication.  See below for further details on his medical conditions.     Return in about 6 months (around 05/23/2020) for Annual physical.    Subjective:     HPI  The patient is seen today for routine follow-up visit.  He was last seen 1 month ago.  His HPI is as follows:    Leg edema  SOB (shortness of breath)  Depression, unspecified depression type  Human immunodeficiency virus (HIV) disease   Essential hypertension  Tobacco use  Alcohol abuse, daily use  Mixed hyperlipidemia  Elevated LFTs  Hepatomegaly  Fatty liver  Obesity (BMI 30-39.9)      His last visit from 1 month ago was an ER follow up visit for LE edema and SOB: He was evaluated in ER on 10/03/2019. His work-up included negative chest x-ray, EKG with no evidence of acute ischemia, normal proBNP, negative troponin, and negative lower extremity venous Doppler for DVT.  BMP revealed normal potassium, glucose of 110, creatinine of 1.2.  Patient was started on a trial of Lasix and recommended he follow-up with  cardiology.  He currently did not have a cardiologist and he was referred to  [facility] cardiology and was scheduled to be seen in 10/2019. He has not been seen by cardiology .  Pt states Lasix has worked a bit with the swelling and mild edema is noted on exam last month. He had clear lung exam then. He was advised to continue Lasix at 20 mg every other day until cardiology evaluation.  Edema may be due to venous insuff.   He was advised to limit his salt intake and get more exercise.  Pt states that LE swelling has improved with lasix. He will take prn.  He was seen by [facility] cardiology in March/2021 and the following is noted from that encounter:   Assessment   59 y.o. male with   Encounter Diagnoses   Name Primary?   ???  Essential hypertension   ??? Hyperlipidemia, mixed   ??? Bilateral leg edema   ??? SOBOE (shortness of breath on exertion) Yes   Plan   -Continue current medical regimen for hypertension control which is stable at this time and without apparent significant side effects or symptoms of medications. Further treatment goals of low sodium diet for additive effects of these medications have been discussed today as well.  -We have discussed risk reduction in the cardiovascular disease process by a continuation of lipid management with the current medication management for lipid reduction. The goals continue to be 30-50% lowering of LDL cholesterol in addition to lifestyle measures. This will include diet and improved activity level on a regular basis.The patient has an understanding of this discussion at this time and we will continue the appropriate strategy.  -Furosemide for lower extremity edema with continued discussion of other home treatment options including the DASH diet and or compression hose  -Regular Stress for shortness of breath and angina   -Echocardiogram for further evaluation of dyspnea and lower extremity edema with cardiomyopathy, valvular heart disease, cardiomegally and pulmonary hypertension   Orders Placed This Encounter   Procedures   ??? CARD stress test only, exercise   ??? Echo complete   No follow-ups on file.  Olena Heckle, MD??    Echocardiogram revealed the following  NORMAL LEFT VENTRICULAR SYSTOLIC FUNCTION ?? WITH MILD LVH   NORMAL RIGHT VENTRICULAR SYSTOLIC FUNCTION   TRIVIAL REGURGITATION NOTED (See above)   NO VALVULAR STENOSIS   TRIVIAL MR, TR   EF 45%     He has a scheduled cardiology follow up visit next week.     - History of alcohol abuse/elevated LFT's and hepatomegaly with fibrofatty liver changes noted on recent US: pt advised to reduce atorvastatin dose to 1/2 of a 40 mg tab daily along with continued reduction in alcohol consumption with Naltrexon on board. In addition obesity and increased abd girth is contributing to liver disease.  He is counseled on carb/sugar restriction to promote weight loss. He declined referral to [facility] GI/Liver clinic. He had abd US done in 04/2019 which revealed the following:  Enlarged echogenic liver, consistent with fibrofatty changes. Please that MRI is more sensitive for detection of hepatic lesions but no hepatic lesions are identified on this examination.  ??  Mild gallbladder wall thickening, likely reactive in the setting of chronic liver disease.     He will need repeat liver US this summer.  Repeat LFT's done in 07/2019 were normal. He takes Naltrexone daily and this has helped him reduce his daily alcohol intake to 1-2 shots of Fireball a day.     - obesity: pt counseled on diet and exercise to promote weight loss. Screening TSH was normal in 04/2019. He has lost some weight in the past month and he has started exercising a bit.    - HTN: good control on present medication regimen. He is advised to start weekly home BP monitoring, low salt dietary intake and resume exercise routine. He had normal electrolytes, creatinine and glucose level in 07/2019.      - dyslipidemia: pt had LDL of 42 in 04/2019. He was advised to reduce dose of lipitor as noted above in light of current liver disease and elevated LFT's.  Repeat LFTs in December/2020 were within normal limits.  He is due for repeat lipid panel on reduced statin dose.     - depression: stable on Remeron and zoloft as directed.    -  tobacco use disorder : the pt is wearing a nicotine patch as directed and this has helped him with his smoking.  He is encouraged to reduce and ultimately stop smoking cigarettes.    - alcohol use disorder: pt will continue with naltrexone as directed and I have stressed importance of the pt to stop drinking alcohol.    - HIV- stable on current medication regimen. He will follow up with ID clinic as scheduled.          ROS  Constitutional:  Denies  unexpected weight loss or gain, or weakness   Eyes:  Denies visual changes  Respiratory:  Denies cough or shortness of breath. No change in exercise  tolerance  Cardiovascular:  Denies chest pain, palpitations or lower extremity swelling   GI:  Denies abdominal pain, diarrhea, constipation   Musculoskeletal:  Denies myalgias  Skin:  Denies nonhealing lesions  Neurologic:  Denies headache, focal weakness or numbness, tingling  Endocrine:  Denies polyuria or polydypsia   Psychiatric:  Denies depression, anxiety      Outpatient Medications Prior to Visit   Medication Sig Dispense Refill   ??? aspirin (ECOTRIN) 81 MG tablet Take 1 tablet (81 mg total) by mouth daily. 90 each 2   ??? atorvastatin (LIPITOR) 40 MG tablet Take 1 tablet (40 mg total) by mouth daily. (Patient taking differently: Take 40 mg by mouth daily. 1/2 a tablet a day) 90 tablet 3   ??? bictegrav-emtricit-tenofov ala (BIKTARVY) 50-200-25 mg tablet Take 1 tablet by mouth daily. 90 tablet 5   ??? furosemide (LASIX) 20 MG tablet 20 mg - 1 tablet QOD 30 tablet 1   ??? gabapentin (NEURONTIN) 600 MG tablet Take 1.5 tablets (900 mg total) by mouth Three (3) times a day. 135 tablet 2   ??? hydroCHLOROthiazide (HYDRODIURIL) 25 MG tablet Take 1 tablet (25 mg total) by mouth daily. TAKE 1 TABLET(25 MG) BY MOUTH DAILY 90 tablet 2   ??? lisinopriL (PRINIVIL,ZESTRIL) 40 MG tablet Take 1 tablet (40 mg total) by mouth daily. 90 tablet 2   ??? mirtazapine (REMERON) 30 MG tablet TAKE 1 TABLET(30 MG) BY MOUTH EVERY NIGHT 30 tablet 5   ??? naltrexone (DEPADE) 50 mg tablet Take 1 tablet (50 mg total) by mouth daily. 30 tablet 3   ??? omeprazole (PRILOSEC) 20 MG capsule TAKE 1 CAPSULE(20 MG) BY MOUTH DAILY 30 capsule 3   ??? sertraline (ZOLOFT) 100 MG tablet TAKE 2 TABLETS(200 MG) BY MOUTH DAILY 60 tablet 5   ??? nicotine (NICODERM CQ) 21 mg/24 hr patch Place 1 patch on the skin daily. 28 patch 2     No facility-administered medications prior to visit.         Objective:       Vital Signs  BP 122/60  - Pulse 98  - Temp 36.6 ??C (97.9 ??F)  - Ht 177.8 cm (5' 10)  - Wt (!) 113.4 kg (250 lb)  - SpO2 97%  - BMI 35.87 kg/m??      Exam  General: normal appearance  EYES: Anicteric sclerae.  ENT: Oropharynx moist.  RESP: Relaxed respiratory effort. Clear to auscultation without wheezes or crackles.   CV: Regular rate and rhythm. Normal S1 and S2. No murmurs or gallops.  No lower extremity edema. Posterior tibial pulses are 2+ and symmetric.  abd exam: non tender, no masses, no HSM   MSK: No focal muscle tenderness.  SKIN: Appropriately warm and moist.  NEURO: Stable gait and coordination.  Allergies:     Chantix [varenicline] and Pollen extracts    Current Medications:     Current Outpatient Medications   Medication Sig Dispense Refill   ??? aspirin (ECOTRIN) 81 MG tablet Take 1 tablet (81 mg total) by mouth daily. 90 each 2   ??? atorvastatin (LIPITOR) 40 MG tablet Take 1 tablet (40 mg total) by mouth daily. (Patient taking differently: Take 40 mg by mouth daily. 1/2 a tablet a day) 90 tablet 3   ??? bictegrav-emtricit-tenofov ala (BIKTARVY) 50-200-25 mg tablet Take 1 tablet by mouth daily. 90 tablet 5   ??? furosemide (LASIX) 20 MG tablet 20 mg - 1 tablet QOD 30 tablet 1   ??? gabapentin (NEURONTIN) 600 MG tablet Take 1.5 tablets (900 mg total) by mouth Three (3) times a day. 135 tablet 2   ??? hydroCHLOROthiazide (HYDRODIURIL) 25 MG tablet Take 1 tablet (25 mg total) by mouth daily. TAKE 1 TABLET(25 MG) BY MOUTH DAILY 90 tablet 2   ??? lisinopriL (PRINIVIL,ZESTRIL) 40 MG tablet Take 1 tablet (40 mg total) by mouth daily. 90 tablet 2   ??? mirtazapine (REMERON) 30 MG tablet TAKE 1 TABLET(30 MG) BY MOUTH EVERY NIGHT 30 tablet 5   ??? naltrexone (DEPADE) 50 mg tablet Take 1 tablet (50 mg total) by mouth daily. 30 tablet 3   ??? nicotine (NICODERM CQ) 21 mg/24 hr patch Place 1 patch on the skin daily. 28 patch 5   ??? omeprazole (PRILOSEC) 20 MG capsule TAKE 1 CAPSULE(20 MG) BY MOUTH DAILY 30 capsule 3   ??? sertraline (ZOLOFT) 100 MG tablet TAKE 2 TABLETS(200 MG) BY MOUTH DAILY 60 tablet 5     No current facility-administered medications for this visit.           Note - This record has been created using [employer]. Chart creation errors have been sought, but may not always have been located. Such creation errors do not reflect on the standard of medical care.    Jenell Milliner, MD

## 2019-11-21 DIAGNOSIS — M5432 Sciatica, left side: Secondary | ICD-10-CM | POA: Diagnosis not present

## 2019-11-21 DIAGNOSIS — M5431 Sciatica, right side: Secondary | ICD-10-CM | POA: Diagnosis not present

## 2019-11-21 DIAGNOSIS — M545 Low back pain: Secondary | ICD-10-CM | POA: Diagnosis not present

## 2019-11-22 ENCOUNTER — Ambulatory Visit: Admit: 2019-11-22 | Discharge: 2019-11-23 | Payer: PRIVATE HEALTH INSURANCE

## 2019-11-22 DIAGNOSIS — Z720 Tobacco use: Secondary | ICD-10-CM | POA: Diagnosis not present

## 2019-11-22 DIAGNOSIS — I10 Essential (primary) hypertension: Secondary | ICD-10-CM | POA: Diagnosis not present

## 2019-11-22 DIAGNOSIS — B20 Human immunodeficiency virus [HIV] disease: Secondary | ICD-10-CM | POA: Diagnosis not present

## 2019-11-22 DIAGNOSIS — E782 Mixed hyperlipidemia: Secondary | ICD-10-CM | POA: Diagnosis not present

## 2019-11-22 DIAGNOSIS — F1020 Alcohol dependence, uncomplicated: Secondary | ICD-10-CM | POA: Diagnosis not present

## 2019-11-22 DIAGNOSIS — R600 Localized edema: Principal | ICD-10-CM

## 2019-11-22 MED ORDER — NICOTINE 21 MG/24 HR DAILY TRANSDERMAL PATCH
MEDICATED_PATCH | TRANSDERMAL | 5 refills | 28.00000 days | Status: CP
Start: 2019-11-22 — End: ?

## 2019-11-23 LAB — LIPID PANEL
CHOLESTEROL/HDL RATIO SCREEN: 4.6 (ref <5.0)
CHOLESTEROL: 170 mg/dL (ref 100–199)
HDL CHOLESTEROL: 37 mg/dL — ABNORMAL LOW (ref 40–59)
NON-HDL CHOLESTEROL: 133 mg/dL
TRIGLYCERIDES: 483 mg/dL — ABNORMAL HIGH (ref 1–149)

## 2019-11-23 LAB — FASTING

## 2019-11-24 NOTE — Unmapped (Signed)
Triglyceride levels remain high at 483 with low HDL/good cholesterol 37.  Patient advised to continue current statin regimen as previously directed.  In addition, recommend adding over-the-counter omega-3 fish oil capsules-3000 mg/day.  I will repeat a lipid panel on follow-up visit.

## 2019-11-26 DIAGNOSIS — M5431 Sciatica, right side: Secondary | ICD-10-CM | POA: Diagnosis not present

## 2019-11-26 DIAGNOSIS — M5432 Sciatica, left side: Secondary | ICD-10-CM | POA: Diagnosis not present

## 2019-11-26 DIAGNOSIS — M545 Low back pain: Secondary | ICD-10-CM | POA: Diagnosis not present

## 2019-12-10 DIAGNOSIS — M5431 Sciatica, right side: Secondary | ICD-10-CM | POA: Diagnosis not present

## 2019-12-10 DIAGNOSIS — M545 Low back pain: Secondary | ICD-10-CM | POA: Diagnosis not present

## 2019-12-10 DIAGNOSIS — M5432 Sciatica, left side: Secondary | ICD-10-CM | POA: Diagnosis not present

## 2020-01-09 DIAGNOSIS — I10 Essential (primary) hypertension: Principal | ICD-10-CM

## 2020-01-09 MED ORDER — HYDROCHLOROTHIAZIDE 25 MG TABLET
ORAL_TABLET | 2 refills | 0 days
Start: 2020-01-09 — End: ?

## 2020-01-09 MED ORDER — GABAPENTIN 600 MG TABLET
ORAL_TABLET | 2 refills | 0 days
Start: 2020-01-09 — End: ?

## 2020-01-09 MED ORDER — LISINOPRIL 40 MG TABLET
ORAL_TABLET | 2 refills | 0 days
Start: 2020-01-09 — End: ?

## 2020-01-14 DIAGNOSIS — I10 Essential (primary) hypertension: Principal | ICD-10-CM

## 2020-01-14 MED ORDER — LISINOPRIL 40 MG TABLET: 40 mg | tablet | Freq: Every day | 2 refills | 90 days | Status: AC

## 2020-01-14 MED ORDER — LISINOPRIL 40 MG TABLET
ORAL_TABLET | Freq: Every day | ORAL | 2 refills | 0.00000 days | Status: CP
Start: 2020-01-14 — End: 2020-10-10

## 2020-01-14 MED ORDER — GABAPENTIN 600 MG TABLET: 900 mg | tablet | Freq: Three times a day (TID) | 2 refills | 30 days | Status: AC

## 2020-01-14 MED ORDER — GABAPENTIN 600 MG TABLET
ORAL_TABLET | Freq: Three times a day (TID) | ORAL | 2 refills | 30.00000 days | Status: CP
Start: 2020-01-14 — End: 2021-01-13

## 2020-01-14 MED ORDER — HYDROCHLOROTHIAZIDE 25 MG TABLET
ORAL_TABLET | Freq: Every day | ORAL | 2 refills | 0.00000 days | Status: CP
Start: 2020-01-14 — End: ?

## 2020-01-14 NOTE — Unmapped (Signed)
Gabapentin refill

## 2020-01-14 NOTE — Unmapped (Signed)
Refill gabapentin 

## 2020-02-04 ENCOUNTER — Ambulatory Visit: Admit: 2020-02-04 | Discharge: 2020-02-05 | Payer: PRIVATE HEALTH INSURANCE

## 2020-02-04 DIAGNOSIS — Z23 Encounter for immunization: Secondary | ICD-10-CM | POA: Diagnosis not present

## 2020-02-04 DIAGNOSIS — B20 Human immunodeficiency virus [HIV] disease: Secondary | ICD-10-CM | POA: Diagnosis not present

## 2020-02-04 DIAGNOSIS — Z9189 Other specified personal risk factors, not elsewhere classified: Secondary | ICD-10-CM | POA: Diagnosis not present

## 2020-02-04 DIAGNOSIS — R413 Other amnesia: Secondary | ICD-10-CM | POA: Diagnosis not present

## 2020-02-04 DIAGNOSIS — Z6835 Body mass index (BMI) 35.0-35.9, adult: Secondary | ICD-10-CM | POA: Diagnosis not present

## 2020-02-04 LAB — ALT (SGPT): Alanine aminotransferase:CCnc:Pt:Ser/Plas:Qn:: 48

## 2020-02-04 LAB — BASIC METABOLIC PANEL
ANION GAP: <3 mmol/L — ABNORMAL LOW (ref 3–11)
BLOOD UREA NITROGEN: 11 mg/dL (ref 9–23)
BUN / CREAT RATIO: 9
CALCIUM: 9.9 mg/dL (ref 8.7–10.4)
CHLORIDE: 102 mmol/L (ref 98–107)
CO2: 31 mmol/L (ref 20.0–31.0)
CREATININE: 1.18 mg/dL — ABNORMAL HIGH (ref 0.60–1.10)
EGFR CKD-EPI AA MALE: 78 mL/min/{1.73_m2}
GLUCOSE RANDOM: 111 mg/dL (ref 70–179)
POTASSIUM: 4.8 mmol/L (ref 3.5–5.1)

## 2020-02-04 LAB — CBC W/ AUTO DIFF
BASOPHILS ABSOLUTE COUNT: 0 10*9/L (ref 0.0–0.1)
BASOPHILS RELATIVE PERCENT: 0.4 %
EOSINOPHILS ABSOLUTE COUNT: 0.1 10*9/L (ref 0.0–0.7)
EOSINOPHILS RELATIVE PERCENT: 2.1 %
HEMATOCRIT: 43.8 % (ref 38.0–50.0)
HEMOGLOBIN: 14.2 g/dL (ref 13.5–17.5)
LYMPHOCYTES ABSOLUTE COUNT: 1.2 10*9/L (ref 0.7–4.0)
LYMPHOCYTES RELATIVE PERCENT: 21.4 %
MEAN CORPUSCULAR HEMOGLOBIN CONC: 32.3 g/dL (ref 30.0–36.0)
MEAN CORPUSCULAR HEMOGLOBIN: 30 pg (ref 26.0–34.0)
MEAN CORPUSCULAR VOLUME: 92.7 fL (ref 81.0–95.0)
MEAN PLATELET VOLUME: 8.1 fL (ref 7.0–10.0)
MONOCYTES ABSOLUTE COUNT: 0.4 10*9/L (ref 0.1–1.0)
MONOCYTES RELATIVE PERCENT: 7.2 %
NEUTROPHILS ABSOLUTE COUNT: 3.9 10*9/L (ref 1.7–7.7)
NEUTROPHILS RELATIVE PERCENT: 68.9 %
PLATELET COUNT: 192 10*9/L (ref 150–450)
WBC ADJUSTED: 5.7 10*9/L (ref 3.5–10.5)

## 2020-02-04 LAB — BILIRUBIN, TOTAL: BILIRUBIN TOTAL: 0.5 mg/dL (ref 0.3–1.2)

## 2020-02-04 LAB — CALCIUM: Calcium:MCnc:Pt:Ser/Plas:Qn:: 9.9

## 2020-02-04 LAB — LYMPH MARKER LIMITED,FLOW
ABSOLUTE CD4 CNT: 588 {cells}/uL (ref 510–2320)
ABSOLUTE CD8 CNT: 228 {cells}/uL (ref 180–1520)
CD3% (T CELLS)": 67 % (ref 61–86)
CD4% (T HELPER)": 49 % (ref 34–58)
CD4:CD8 RATIO: 2.6 (ref 0.9–4.8)

## 2020-02-04 LAB — BASOPHILS RELATIVE PERCENT: Basophils/100 leukocytes:NFr:Pt:Bld:Qn:Automated count: 0.4

## 2020-02-04 LAB — AST (SGOT): Aspartate aminotransferase:CCnc:Pt:Ser/Plas:Qn:: 47 — ABNORMAL HIGH

## 2020-02-04 LAB — CD8% T SUPPRESR": Lab: 19

## 2020-02-04 LAB — BILIRUBIN TOTAL: Bilirubin:MCnc:Pt:Ser/Plas:Qn:: 0.5

## 2020-02-04 MED ORDER — ATORVASTATIN 20 MG TABLET
ORAL_TABLET | Freq: Every day | ORAL | 5 refills | 30.00000 days | Status: CP
Start: 2020-02-04 — End: 2020-08-02

## 2020-02-04 MED ORDER — BIKTARVY 50 MG-200 MG-25 MG TABLET
ORAL_TABLET | Freq: Every day | ORAL | 5 refills | 90 days | Status: CP
Start: 2020-02-04 — End: ?

## 2020-02-04 MED ORDER — MIRTAZAPINE 30 MG TABLET
ORAL_TABLET | Freq: Every evening | ORAL | 5 refills | 30.00000 days | Status: CP
Start: 2020-02-04 — End: ?

## 2020-02-04 MED ORDER — OMEPRAZOLE 20 MG CAPSULE,DELAYED RELEASE
ORAL_CAPSULE | Freq: Every day | ORAL | 3 refills | 30 days | Status: CP
Start: 2020-02-04 — End: ?

## 2020-02-04 NOTE — Unmapped (Signed)
No falls in the last year. NO unsteadiness when standing or walking

## 2020-02-04 NOTE — Unmapped (Addendum)
It was great to see you today.    - Continue Biktarvy every day  - I am referring you to Neurology for the memory issues, Weight Management, and the dental clinic.  - Congratulations on cutting down on your alcohol use!  - Plan for smoking is to try to switch to vape with nicotine patch to get off of cigarettes completely.  - You are getting your 2nd shingles vaccine today  - Labs today  - Follow-up in 6 months      Please note:  New Clinic Location at [facility]: [address], [place], [place] [address]    Contacting us   During working hours  [phone]  After hours or weekends [phone] and ask for the ID doctor on call  Fax number   [phone]    MEDICATIONS  For refills, please contact your pharmacy and ask them to electronically send or fax the request to the clinic.     Please bring all medications in original bottles to every appointment.    HMAP (formerly ADAP) or [employer] Eligibility (required even if you do not receive medication through [facility])  Please remember to renew your Juanell Fairly eligibility during renewal periods which occur twice a year: January-March and July-September.     The following are needed for each renewal:   - [facility] Identification (if you don't have one, then a bill with your name and address in [place])   - proof of income (award letter, W-2, or last three check stubs)   If you are unable to come in for renewal, let us know if we can mail, fax or e-mail paperwork to you.   HMAP Contact: [phone]      Urgent Care Clinic  Monday, Tuesday, and Thursday from 8:30 - 12 noon  Please call ahead to speak with the nursing staff if you think you need to be seen urgently!    Lab info:  Your most recent CD4 T-cell counts and viral loads are below. Here are a few things to keep in mind when looking at your numbers:  ?? For most people, we're checking CD4 counts fairly infrequently (once a year or less)  ?? It's normal for your CD4 count to be different from visit to visit.   ?? We consider your viral load to be undetectable if it says <40 or if it says Not detected.  ?? Our goal is to get your virus to be undetectable and keep it undetectable. You can help by taking your medications at about the same time, every single day. If you're having trouble with taking your medications, it's important to let us know.    YOUR RECENT LAB RESULTS:  CD4 and VIRAL LOAD  Lab Results   Component Value Date    ACD4 737 05/02/2019    HIVRS Not Detected 08/06/2019

## 2020-02-04 NOTE — Unmapped (Signed)
Infectious Diseases Clinic        Assessment/Plan:      Larry Montes is a 59 yo man with a history of well controlled HIV, tobacco use disorder, alcohol use d/o, HLD, HTN, low back pain with radiculopathy, and depression here for follow-up of his HIV.   ??  PCP is Dr. Vinson Moselle at [facility].    HIV  Overall doing well. He never misses doses of Biktarvy (BIC/FTC/TAF). Accesses meds via insurance. CD4 counts have been over 300 for >2Y on suppressive ART. CD4 rechecks can be annual.    We reviewed how to take medications and importance of NOT intermittently interrupting medications.    We discussed importance of separating administration of integrase inhibitor from dietary supplements and antacids.    Lab Results   Component Value Date    ACD4 588 02/04/2020    CD4 49 02/04/2020    HIVRS Not Detected 08/06/2019    HIVCP <40 (H) 05/07/2018     ??? Continue current therapy  ??? Checking CD4, HIV RNA, & safety labs (full return)  ??? Encouraged continued excellent ARV adherence.    Memory difficulty  - He reports continued concern over memory issues including misplacing items, forgetting daily tasks and difficulty finding words. Family history of dementia in his grandmother. He was referred to Neurology by his PCP for further evaluation but never scheduled.  - Will re-refer today.   -     Ambulatory referral to Neurology; Future    BMI 35.0-35.9,adult  - Patient interested in weight management options, specifically medication options. He would also like nutrition counseling.   - Referral placed to [facility].    Essential hypertension  - Currently taking Lisinopril and HCTZ. BP borderline today (reports stress related to rushing to clinic).  - Continue current regimen.  - Follow-up with PCP as scheduled.  ??  Alcohol abuse, daily use  - Previously followed by [facility] program, although hasn't seen them in >6 months. He is taking Naltrexone 50mg  daily. He quit in November for a short time, but relapsed. He has decreased alcohol use significantly, now will sip 2-3 shots worth of Fireball most nights.  - He feels much better since decreasing alcohol consumption- energy is better, less diarrhea.  - Advised to follow-up with [facility] since he is due for follow-up.     Tobacco use-  - Started smoking at age 18, smoked 1 ppd for most of his life, now at 10cpd. He has quit cigarettes in the past when vaping.  - We discussed role of e-cigarettes in harm reduction but with end goal of being completely nicotine free. His goal is to transition to e-cigarettes and patches and to abstain from combustible cigarette use.   - Overdue for Lung Cancer Screening- advised to call to schedule.  ??  Right posterior parotid mass: -??Not addressed today.  9-17- seen at [facility] for TIA and Bell's palsy. 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    Posterior right parotid space small 2.4 cm soft tissue nodule. This appears unrelated to the presentation of right facial weakness, and benign etiology (such as Warthin's or Benign Mixed Tumor) is favored. Recommend outpatient follow-up with ENT.   - Pt says has been there for years and is unchanged.  - Will monitor clinically- consider ENT referral if any changes.     Sexual health & secondary prevention  Monogamous with partner, Larry Montes. They have been together for 4 years.  Larry Montes has HIV and is in care.   Mr. Agner declines STI testing today.     Lab Results   Component Value Date    RPR Nonreactive 10/02/2018    CTNAA Negative 05/10/2017    GCNAA Negative 05/10/2017    SPECSOURCE Rectal-Veblen Only 05/10/2017     Health maintenance    Wt Readings from Last 5 Encounters:   02/04/20 (!) 112.4 kg (247 lb 12.8 oz)   11/22/19 (!) 113.4 kg (250 lb)   10/14/19 (!) 116.6 kg (257 lb)   08/06/19 (!) 111.1 kg (244 lb 14.4 oz)   07/23/19 (!) 109.3 kg (241 lb)     Oral health  He does not have a dentist. Last dental exam more than 12 months ago.  - Re-referred to dental clinic today.       Metabolic conditions  Lab Results   Component Value Date    CREATININE 1.18 (H) 02/04/2020    PROTEINUR 12.2 05/02/2019    GLUCOSEU Negative 08/29/2019    PCRATIOUR 0.067 05/02/2019    A1C 5.4 11/09/2016     # Diabetes - managed through primary care  # Kidney health - CHECK Q6M UAs ON TDF OR TAF  # Bone health - DEXA needed, defer to future visit    Communicable diseases  Lab Results   Component Value Date    QFTTBGOLD Negative 03/24/2016    HEPCAB Nonreactive 05/02/2019    HCVRNA Not Detected 10/01/2015     # TB screening - no longer needed; negative IGRA, low risk  # HCV screening - negative Ab; rescreen w/Ab q1-2y    Cancer screening  Lab Results   Component Value Date    PSA 0.90 05/10/2017    FINALDX  07/07/2017     A.  Colon, transverse, biopsy  - Hyperplastic polyp (multiple fragments)    B.  Colon, descending, biopsy  - Hyperplastic polyp (multiple fragments)    C.  Colon, sigmoid, biopsy  - Hyperplastic polyp (multiple fragments)    D.  Colon, sigmoid #2, biopsy  - Hyperplastic polyp (multiple fragments)    E.  Colon, rectum, biopsy  - Hyperplastic polyp (multiple fragments)         # Anorectal - neg 05/2017- Overdue- recommended today but he declines.  # Colorectal - Numerous colon polyps on 05/2017 c-scope. Per Dr. Fara Boros, 10 year follow-up recommended  # Liver - not applicable  # Lung - neg 04/2018. Repeat in 12 months- Overdue- advised to schedule.    Cardiovascular disease  Lab Results   Component Value Date    CHOL 170 11/22/2019    HDL 37 (L) 11/22/2019    LDL  11/22/2019      Comment:      Unable to calculate due to triglyceride greater than 400 mg/dL.    NONHDL 133 11/22/2019    TRIG 483 (H) 11/22/2019     # The 10-year ASCVD risk score Denman George DC Jr., et al., 2013) is: 18.6%  - is taking aspirin   - is taking statin  - BP control fair  - current smoker    Immunization History   Administered Date(s) Administered   ??? COVID-19 VACCINE,MRNA(MODERNA)(PF)(IM) 10/28/2019   ??? Hepatitis A 04/21/2010, 12/29/2010   ??? Hepatitis B, Adult 07/08/2009, 11/04/2009, 04/21/2010, 05/10/2017   ??? INFLUENZA TIV (TRI) PF (IM) 07/08/2009, 04/21/2010, 06/23/2011   ??? Influenza Vaccine Quad (IIV4 PF) 66mo+ injectable 06/28/2012, 05/02/2013, 08/28/2014, 10/01/2015, 05/05/2016, 05/10/2017, 05/07/2018, 05/02/2019   ??? PNEUMOCOCCAL POLYSACCHARIDE  23 02/19/2009, 02/26/2015   ??? PPD Test 11/04/2009, 12/29/2010   ??? Pneumococcal Conjugate 13-Valent 03/27/2014   ??? SHINGRIX-ZOSTER VACCINE (HZV), RECOMBINANT,SUB-UNIT,ADJUVANTED IM 08/06/2019   ??? TdaP 02/19/2009   ??? Tetanus and diptheria,(adult), adsorbed, 2Lf tetanus toxoid, PF 08/06/2019     Lab Results   Component Value Date    HEPAIGG Reactive (A) 05/02/2013    HEPBSAB Reactive (A) 05/07/2018     Counseling services took 15 minutes of today's visit time.    Disposition    Return to clinic 5-6 months or sooner if needed.    Lahoma Rocker, PA-C  [facility]   [address]  [place], [place].  [address]  Phone: [phone]   Fax: [phone]            Chief Complaint   Follow-up HIV    HPI  In addition to details in A&P above:    Larry Montes is here for routine HIV follow-up. He feels he is doing very well overall. He takes Radio producer daily and hasn't missed any doses.   He is concerned about his weight. He has been trying to be more active, working in his yard, and has been working on Presenter, broadcasting. He would like to consider medication options.    He reports continued concern over his memory- he can remember what happened at work today, but if asked questions about something at work a few days ago he has trouble recalling information. He also will sometimes misplace things. He was going to see Neurology this Winter but things got busy so he didn't schedule.     He has been working with PT for his back pain- this has been helping significantly. He also received a Right L5/S1 facet joint injections under fluoroscopy 3/21 that was helpful for some time but then wore off. He feels his pain is under much better control than in the past.     He continues to use Gabapentin for neuropathic pain with good control.    He is drinking 2-3 shots of Fire Ball every night- he sips on this throughout the night. He is feeling much better since cutting down his alcohol use. He is still smoking around 10 cpd. He wants to quit, but it has been difficult as his partner, Larry Montes and sister, who he lives with both smoke. He used an e-cigarette in the past and this helped him quit combustible cigarettes. He wants to try this again along with nicotine patch.     Past Medical History:   Diagnosis Date   ??? Alcohol abuse     Hospitalized in the past - has been able to abstain for periods of time   ??? Anxiety 1980   ??? Arthritis 2020   ??? Bell's palsy 2018   ??? Depression    ??? GERD (gastroesophageal reflux disease) 1990   ??? HIV disease (CMS-HCC)    ??? Hyperlipidemia    ??? Hypertension    ??? Osteoporosis 2000   ??? Sickle cell anemia (CMS-HCC)        Social History    Lives with his partner, Larry Montes, and his sister in [place]. 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   ??  Has sex with men only. Bottom (receptive) only. Gives and receives oral sex. ??No personal h/o STDs -- says he was celibate for years before meeting his current partner.   ??  Past history of IV cocaine use in the late  1990s, but nothing since. ??Uses MJ occasionally.  Ongoing smoking 10 ppd.  Has significantly decreased alcohol use- now drinking 2-3 shots daily of fire ball.    Review of Systems  As per HPI. All others negative.    Medications and Allergies  He has a current medication list which includes the following prescription(s): aspirin, atorvastatin, biktarvy, gabapentin, hydrochlorothiazide, lisinopril, mirtazapine, naltrexone, nicotine, omeprazole, sertraline, and furosemide.    Allergies: Chantix [varenicline] and Pollen extracts    Family History  His family history includes Alcohol abuse in his daughter and father; Arthritis in his paternal grandmother; Cancer in his mother and sister; Depression in his brother, father, mother, and sister; Diabetes in his mother; Drug abuse in his brother, sister, sister, and sister; Heart disease in his maternal grandfather and mother; Hypertension in his father, maternal grandfather, and mother; Mental illness in his brother, father, mother, and sister; Vision loss in his mother.           BP 140/88 (BP Site: L Arm, BP Position: Sitting, BP Cuff Size: Large)  - Pulse 78  - Temp 36.4 ??C (97.6 ??F) (Oral)  - Ht 177.8 cm (5' 10)  - Wt (!) 112.4 kg (247 lb 12.8 oz)  - BMI 35.56 kg/m??      Physical Exam  Vitals reviewed.   Constitutional:       Appearance: Normal appearance.   HENT:      Mouth/Throat:      Mouth: Mucous membranes are moist.      Pharynx: Oropharynx is clear.   Cardiovascular:      Rate and Rhythm: Normal rate and regular rhythm.   Pulmonary:      Effort: Pulmonary effort is normal.      Breath sounds: Normal breath sounds.   Musculoskeletal:         General: Normal range of motion.      Right lower leg: No edema.      Left lower leg: No edema.   Neurological:      General: No focal deficit present.      Mental Status: He is alert.   Psychiatric:         Mood and Affect: Mood normal.         Behavior: Behavior normal.         Thought Content: Thought content normal.         Judgment: Judgment normal.       [email or web address]

## 2020-02-05 LAB — HIV RNA, QUANTITATIVE, PCR: HIV RNA QNT RSLT: NOT DETECTED

## 2020-02-05 LAB — HIV RNA: HIV 1 RNA:NCnc:Pt:Ser/Plas:Qn:Probe.amp.tar: 0

## 2020-02-05 NOTE — Unmapped (Signed)
Addended by: Francisca December on: 02/04/2020 05:44 PM     Modules accepted: Orders

## 2020-02-14 DIAGNOSIS — F1020 Alcohol dependence, uncomplicated: Principal | ICD-10-CM

## 2020-02-17 MED ORDER — NALTREXONE 50 MG TABLET
ORAL_TABLET | Freq: Every day | ORAL | 3 refills | 30 days
Start: 2020-02-17 — End: 2021-02-16

## 2020-02-26 DIAGNOSIS — K118 Other diseases of salivary glands: Principal | ICD-10-CM

## 2020-02-26 NOTE — Unmapped (Signed)
Called patient to follow-up on MyChart message and need for ENT follow-up. Would like more information on lump near his right ear (unclear if he is describing possible parotid gland mass or something different).  - Will initiate referral to ENT due to history of parotid gland mass seen on MRI.

## 2020-02-28 ENCOUNTER — Ambulatory Visit: Admit: 2020-02-28 | Discharge: 2020-02-29 | Payer: PRIVATE HEALTH INSURANCE

## 2020-02-28 ENCOUNTER — Encounter: Admit: 2020-02-28 | Discharge: 2020-02-29 | Payer: PRIVATE HEALTH INSURANCE

## 2020-02-28 DIAGNOSIS — K118 Other diseases of salivary glands: Principal | ICD-10-CM

## 2020-03-04 NOTE — Unmapped (Signed)
Called pt to let them know about HMAP renewal starting in July. Left pt a voicemail letting them know I was calling in order to schedule a in person or phone visit to do recert application. Asked pt to give me a call at [phone]. Sent pt renewal reminder letter and Mychart message.     Larey Dresser  Benefits Comptroller - ID Clinic   Time Duration of intervention in minutes: 10 mins

## 2020-03-09 ENCOUNTER — Encounter: Admit: 2020-03-09 | Payer: PRIVATE HEALTH INSURANCE

## 2020-03-18 ENCOUNTER — Encounter: Admit: 2020-03-18 | Discharge: 2020-03-19 | Payer: PRIVATE HEALTH INSURANCE

## 2020-03-18 NOTE — Unmapped (Signed)
Called pt to complete HMAP summer renewal. Completed application w/pt. Pt is going to email 2 most recent paystubs, email sent to [email or web address]    Kaitlyn Abshire  Benefits Coordinator, ID Clinic  Time of intervention: 10 mins

## 2020-03-20 NOTE — Unmapped (Signed)
Made third and final attempt to counsel Alfonso Ramus, 59 y.o. for treatment of tobacco use/dependence. Pt had expressed interest in counseling via automated phone program. Pt will continue to receive automated calls, which will provide opportunities for pt to connect with Tobacco Treatment Program in the future.     Tobacco Use Treatment  Program: [facility]  Type of Visit: Attempt  Tobacco Use Treatment Visit: Patient not available (LVM x3)    Cancer Center Patients  Primary Service: Other (TA)  Primary Cancer Type: N/A    Tobacco Use During Past 30 Days  Type of Tobacco Products Used: Cigarettes    TTS Information  TTS Visit Length: Less than 3 minutes    Doristine Devoid  Tobacco Treatment Counselor  [facility]  [phone]

## 2020-03-20 NOTE — Unmapped (Signed)
Alfonso Ramus  [address]  [place] [address]    [employer] Eligibility Form    Eligibility period  From 05/08/2020 to 11/05/2020    Eligible for:   [x]  [employer] services and caps on charges   []  Caps on charges ONLY    [employer] eligibility  FPL=273%  Patients who are [employer]ell Fairly Service eligible must have a household FPL (Family Poverty Level) lower than 300%. [employer]ly may be able to help eligible patients with transportation, dental cleanings, mental health services, case management, nutrition services, and more. Being [employer] Service eligible does not automatically enroll you in any of the mentioned services. However, you can speak with any member of the ID clinic to link you to services if needed.     Caps on Charges eligibility  Your IPL=273%  The [employer] Program requires that insured and uninsured individuals be charged no more than a maximum amount in a calendar year based on individual poverty income (IPL). We can stop charging for the ID clinic provider's visit once you have met  7% of your income in medical bills related to your HIV in a calendar year (November 07, 2019 to November 05, 2020). The following charges can apply towards the caps on charges:   o Health insurance premiums  o Lab visits  o Specialty care related to HIV, such as:  o Dental care  o Vision care  o Mental health and Substance abuse counseling etc.    Proof of these HIV related charges must be provided to the ID Benefits Counselors for these charges to count towards to your Caps on Charge. Please bring copies of these charges or bills to the clinic or mail to the address below and mention in the letter Caps on Charges. Please note [facility] is not responsible for these payments.  [facility]   [id number]   [address]   [place], [place] [address]        Lovie Chol  Benefits Coordinator, ID Clinic  Time of intervention: 10 mins

## 2020-03-30 ENCOUNTER — Ambulatory Visit: Admit: 2020-03-30 | Payer: PRIVATE HEALTH INSURANCE

## 2020-03-30 NOTE — Unmapped (Signed)
Tried calling patient 3 times to see if he was coming for his appointment. Left a message at 4:00 stating he needs to reschedule and provided the scheduling number.

## 2020-04-09 MED ORDER — NICOTINE 21 MG/24 HR DAILY TRANSDERMAL PATCH
MEDICATED_PATCH | TRANSDERMAL | 2 refills | 28.00000 days | Status: CP
Start: 2020-04-09 — End: 2020-07-02

## 2020-04-09 NOTE — Unmapped (Signed)
Counseled Larry Montes, 59 y.o. for treatment of tobacco use/dependence. Pt had expressed interest in counseling via automated phone program.    SUMMARY: Counselor assessed pt's current tobacco use and history of use, and used MI to elicit motivation. Counselor provided evidence-based treatment for tobacco use, including discussing triggers, behavioral strategies, and tobacco cessation medications. Pt desires to quit to improve his overall health. Pt mentioned that he used the nicotine patch during previous quit attempts which helped him significantly. Counselor suggested pt combine in his nicotine patch with either the nicotine gum or lozenge. Pt reported that he has the nicotine gum and uses it seldomly due to not being very effective. Pt stated that his main priority is to begin using the nicotine patches. Counselor respected pt's treatment plan decision. Pt explain that his most challenging obstacle for quitting is living with both his partner and his sister who smoke. Pt stated that neither are interested in quitting at this time and laughed at [him] when he suggested that they make the home smoke-free. Counselor sympathized with pt and encouraged him to create compromising boundaries such as making the public spaces of the home smoke-free. Pt receptive to suggestion. Counselor made plans to follow up with pt. Pt informed counselor that the best way to reach him would be anytime during the day using his work phone.     Tobacco Use Treatment  Program: [facility]  Type of Visit: Initial  Session Number: 1  Tobacco Use Treatment Visit: Talked with patient  Permission To Engage In Conversation Re: PHI w/ Visitors Present: n/a  Goals Of Session: Insight, increase, Assessment, Communication of feelings, Risk behaviors, modulate, Behavior management, improve    Cancer Center Patients  Primary Service: Other (TA)  Primary Cancer Type: N/A    Tobacco Use During Past 30 Days  Time Since Last Tobacco Use: smoked a cigarette today (at least one puff)  Tobacco Withdrawal (Past 24 Hours): None noted  Type of Tobacco Products Used: Cigarettes  Quantity Used: 20  Quantity Per: day  Other Household Members Use Tobacco: Yes (Partner and sister)  Smoking Allowed in Home: Yes    Tobacco Use History  Age Began Use (Years Old): 12  Medications Used in Past Attempts: Nicotine Gum, Nicotine Patch, Varenicline  Side Effects: 1. Nicotine gum - ineffective 2. Varenicline - allergic    Behavioral Assessment  Why Uses: 1. Habit 2. Stress 3. Family members smoke  Reasons to Become Tobacco Free: 1. Health 2. I need to do this for myself  Barriers/Challenges: 1. Habit 2. Smoking allowed indoors 3. Pt's partner and sister both smoke 4. Pt's partner and sister not interested in quitting   Strategies: 1. NRT 2. Set boundaries with both partner and sister 3. Make home smoke-free or make all public spaces smoke-free    Treatment Plan  Cessation Meds Currently Using: Gum 4mg   Outpatient/Discharge Medications Recommended: Gum 4mg , Patch 21mg   Plan to Obtain Outpatient Meds: TTS messaged providers for Rx (Msg'd Dr. Edwyna Ready for patch Rx)  Patient's Plan Post Discharge/Visit: Plan to quit as soon as possible  Follow-up Plan: Permission given, Phone follow-up scheduled  Family Members Included in Intervention/Plan: No  Comments/Notes: Pt was at work during call which limited the duration of conversation    TTS Information  Diagnosis: Tobacco use disorder, unspecified, uncomplicated (F17.200)  Interventions: Assessed, Behavioral techniques, Informed, Discussed, Motivational interviewing, Suggested, Tx plan development, Psycho-education, Cognitive behavioral therapy, Encouraged, Taught  TTS Visit Length: 3-10 minutes  Doristine Devoid  Tobacco Treatment Counselor  [facility] Tobacco Treatment Program  [phone]

## 2020-04-16 NOTE — Unmapped (Unsigned)
Referral Services Note     Duration of Intervention: < 5 min     REASON/TYPE OF CONTACT: Phone, SW attempted to reach out to patient regarding medication assisted treatment for alcohol use and assess history of alcohol use.     ASSESSMENT: Patient did not answer    INTERVENTION:  SW reached out via phone and left a discreet voice message.     PLAN:  SW will attempt to reach out 2 more times to assess patient needs regarding MAT for alcohol use.     Gilberto Streck LCSW, LCAS  ID Clinic Social Work   Direct: [phone]

## 2020-04-18 MED ORDER — GABAPENTIN 600 MG TABLET
ORAL_TABLET | 2 refills | 0.00000 days
Start: 2020-04-18 — End: ?

## 2020-04-21 MED ORDER — MIRTAZAPINE 30 MG TABLET
ORAL_TABLET | 5 refills | 0.00000 days
Start: 2020-04-21 — End: ?

## 2020-04-23 NOTE — Unmapped (Signed)
Provided follow-up counseling to Larry Montes, 59 y.o. for treatment of tobacco use/dependence.     SUMMARY: Counselor re-assessed pt's tobacco use status and followed up about treatment plan. Pt endorsed a significant decrease in his daily cigarette use. Pt stated that both the nicotine patch and crafting have been helping him reduce. Pt's main barrier preventing him from quitting as easily as he would have liked remain his family members smoking; however, pt stated that he noticed his sister beginning to reduce her cigarette use and his partner start to smoke outside. Pt has been encouraging his sister and partner to try using the nicotine patch, but continues to be met with resistance. Pt mentioned that he was considering buying vape products to help him quit. Counselor educated pt on the known hazards of using vape products and encouraged him try using the nicotine inhaler instead. Pt expressed interest in trying the nicotine inhaler. Counselor sent script request to provider and made plans to follow up with pt.     Tobacco Use Treatment  Program: [facility]  Type of Visit: Follow-up  Session Number: 2  Tobacco Use Treatment Visit: Talked with patient  Permission To Engage In Conversation Re: PHI w/ Visitors Present: n/a  Goals Of Session: Insight, increase, Assessment, Risk behaviors, modulate, Behavior management, improve    Cancer Center Patients  Primary Service: Other (TA)  Primary Cancer Type: N/A    Tobacco Use During Past 30 Days  Time Since Last Tobacco Use: smoked a cigarette today (at least one puff)  Tobacco Withdrawal (Past 24 Hours): None noted  Type of Tobacco Products Used: Cigarettes  Quantity Used: 10  Quantity Per: day  Other Household Members Use Tobacco: Yes (Partner & sister)  Smoking Allowed in Home: Yes    Tobacco Use History  Medications Used in Past Attempts: Nicotine Gum, Nicotine Patch, Varenicline  Side Effects: 1. Nicotine gum - ineffective 2. Varenicline - allergic    Behavioral Assessment  Why Uses: 1. Habit 2. Stress 3. Family members smoke  Reasons to Become Tobacco Free: 1. Health 2. I need to do this for myself  Barriers/Challenges: 1. Habit 2. Smoking allowed indoors 3. Pt's partner and sister both smoke 4. Pt's partner and sister not interested in quitting 5. Pt considering using vape products to help him quit   Strategies: 1. Nicotine patch 2. Nicotine inhaler 3. Pt staying busy by doing crafts 4. Pt to encourage partner and sister to reduce with him 5. Pt set goal to reduce to 5cpd by next session    Treatment Plan  Cessation Meds Currently Using: Patch 21mg   Outpatient/Discharge Medications Recommended: Patch 21mg , Inhaler  Plan to Obtain Outpatient Meds: TTS messaged providers for Rx (Msg'd Dr. Edwyna Ready for inhaler Rx)  Patient's Plan Post Discharge/Visit: Plan to quit as soon as possible  Follow-up Plan: Phone follow-up scheduled, Permission given  Family Members Included in Intervention/Plan: No    TTS Information  Diagnosis: Tobacco use disorder, unspecified, uncomplicated (F17.200)  Interventions: Assessed, Behavioral techniques, Discussed, Informed, Motivational interviewing, Suggested, Taught, Tx plan development, Psycho-education, Cognitive behavioral therapy, Encouraged  TTS Visit Length: 3-10 minutes    Doristine Devoid  Tobacco Treatment Counselor  [facility] Tobacco Treatment Program  [phone]

## 2020-04-24 MED ORDER — NICOTROL 10 MG INHALATION CARTRIDGE
2 refills | 0 days | Status: CP
Start: 2020-04-24 — End: ?

## 2020-04-28 NOTE — Unmapped (Signed)
Referral Services Note     Duration of Intervention: <5 min     REASON/TYPE OF CONTACT: Phone, SW reached out to patient following at risk alcohol use and request access to medication assisted treatment for alcohol.     ASSESSMENT: Patient did not answer    INTERVENTION:  SW left a discreet message    PLAN:  SW will attempt to reach out for 3rd attempt to engage in one week. Attempt 2/3.     Lemma Tetro LCSW, LCAS  ID Clinic Social Work   Direct: [phone]

## 2020-04-30 NOTE — Unmapped (Signed)
Referral Services Note     Duration of Intervention: 5 min     REASON/TYPE OF CONTACT: Phone, Patient called this social back re: connection to MAT for alcohol use disorder    ASSESSMENT: Patient reports he has not yet been connected with [facility] and would like assistance    INTERVENTION:  SW agreed to make connection to [employer].     PLAN:  SW will reach out and assist with connection to treatment.     Zuleyka Kloc LCSW, LCAS  [facility]   Direct: [phone]

## 2020-05-01 DIAGNOSIS — F1020 Alcohol dependence, uncomplicated: Principal | ICD-10-CM

## 2020-05-01 MED ORDER — NALTREXONE 50 MG TABLET
ORAL_TABLET | Freq: Every day | ORAL | 3 refills | 30 days
Start: 2020-05-01 — End: 2021-05-01

## 2020-05-02 NOTE — Unmapped (Signed)
He needs to call Herbert Seta at [phone] and get on my ASAP schedule.

## 2020-05-05 NOTE — Unmapped (Signed)
Referral Services Note     Duration of Intervention: 10 min     REASON/TYPE OF CONTACT: Collaboration, SW reached out to [facility] in [place] to follow up on referral for patient.      ASSESSMENT: SW spoke with representative from [facility] and she confirmed that patient had done an over the phone intake. Patient was scheduled for an appointment yesterday but the appointment was canceled due to provider being unavailable. Representative agreed to call patient and reschedule the appointment. Representative agreed to keep this Child psychotherapist informed about progress with the referral.     INTERVENTION:  SW discussed continuity of care for patient regarding appointment for ongoing treatment.     PLAN:  SW will follow up with referral in one week if she has gotten no response.     Stephanie Mcglone LCSW, LCAS  [facility]   Direct: [phone]

## 2020-05-11 ENCOUNTER — Ambulatory Visit: Admit: 2020-05-11 | Discharge: 2020-05-12 | Payer: PRIVATE HEALTH INSURANCE

## 2020-05-11 DIAGNOSIS — Z23 Encounter for immunization: Secondary | ICD-10-CM | POA: Diagnosis not present

## 2020-05-11 DIAGNOSIS — R1030 Lower abdominal pain, unspecified: Secondary | ICD-10-CM | POA: Diagnosis not present

## 2020-05-11 DIAGNOSIS — Z6835 Body mass index (BMI) 35.0-35.9, adult: Secondary | ICD-10-CM | POA: Diagnosis not present

## 2020-05-11 NOTE — Unmapped (Signed)
Assessment/Plan:    Larry Montes was seen today for possible hernia.    Diagnoses and all orders for this visit:    Inguinal pain, unspecified laterality  -     US Pelvis Limited; Future    Other orders  -     INFLUENZA INJ MDCK PF, QUAD (FLUCELVAX)(2Y AND UP EGG FREE)      No obvious inguinal hernia noted on exam today but pt is referred for screening US. He is advised on rest, otc NSAID as directed and heat pad. I will call the pt with his [place] result and recommendation.     Return if symptoms worsen or fail to improve.    Subjective:     HPI  The pt is seen today with concerns of possible left sided hernia.  He has not noted any swelling or bulging in the area but he has discomfort in that area.  Pt notices the discomfort when he gets out of bed. He is taking OTC NSAID.  He moved some furniture and noted discomfort after this.    I last saw the pt in 11/2019. His PMH is remarkable for alcohol use disorder, HIV, tobacco use disorder, dyslipidemia and LE edema.      ROS  Constitutional:  Denies  unexpected weight loss or gain, or weakness   Eyes:  Denies visual changes  Respiratory:  Denies cough or shortness of breath. No change in exercise  tolerance  Cardiovascular:  Denies chest pain, palpitations or lower extremity swelling   GI:  Denies abdominal pain, diarrhea, constipation   Musculoskeletal:  Denies myalgias  Skin:  Denies nonhealing lesions  Neurologic:  Denies headache, focal weakness or numbness, tingling  Endocrine:  Denies polyuria or polydypsia   Psychiatric:  Denies depression, anxiety      Outpatient Medications Prior to Visit   Medication Sig Dispense Refill   ??? aspirin (ECOTRIN) 81 MG tablet Take 1 tablet (81 mg total) by mouth daily. 90 each 2   ??? atorvastatin (LIPITOR) 20 MG tablet Take 1 tablet (20 mg total) by mouth daily. 1/2 a tablet a day 30 tablet 5   ??? bictegrav-emtricit-tenofov ala (BIKTARVY) 50-200-25 mg tablet Take 1 tablet by mouth daily. 90 tablet 5   ??? chlorhexidine (PERIDEX) 0.12 % solution 15 mL by Mouth route Two (2) times a day. Rinse and spit out excess. 900 mL 5   ??? furosemide (LASIX) 20 MG tablet 20 mg - 1 tablet QOD (Patient taking differently: Take 20 mg by mouth. Daily prn) 30 tablet 1   ??? gabapentin (NEURONTIN) 600 MG tablet Take 1.5 tablets (900 mg total) by mouth Three (3) times a day. (Patient taking differently: Take 600 mg by mouth Three (3) times a day. ) 135 tablet 5   ??? hydroCHLOROthiazide (HYDRODIURIL) 25 MG tablet Take 1 tablet (25 mg total) by mouth daily. TAKE 1 TABLET(25 MG) BY MOUTH DAILY 90 tablet 2   ??? lisinopriL (PRINIVIL,ZESTRIL) 40 MG tablet Take 1 tablet (40 mg total) by mouth daily. 90 tablet 2   ??? mirtazapine (REMERON) 30 MG tablet Take 1 tablet (30 mg total) by mouth nightly. 30 tablet 5   ??? naltrexone (DEPADE) 50 mg tablet Take 1 tablet (50 mg total) by mouth daily. 30 tablet 3   ??? nicotine (NICODERM CQ) 21 mg/24 hr patch Place 1 patch on the skin daily. 28 patch 5   ??? nicotine (NICOTROL) 10 mg inhaler Dosage: 1 cartridge q 1-2 hrs Frequency: 6-16 cartridges/day 300 each 2   ???  omeprazole (PRILOSEC) 20 MG capsule Take 1 capsule (20 mg total) by mouth daily. TAKE 1 CAPSULE(20 MG) BY MOUTH DAILY 30 capsule 3   ??? sertraline (ZOLOFT) 100 MG tablet TAKE 2 TABLETS(200 MG) BY MOUTH DAILY 60 tablet 5   ??? nicotine (NICODERM CQ) 21 mg/24 hr patch Place 1 patch on the skin daily. 28 patch 2     No facility-administered medications prior to visit.         Objective:       Vital Signs  BP 144/86  - Pulse 92  - Temp 36.9 ??C (98.5 ??F)  - Resp 16  - Ht 177.8 cm (5' 10)  - Wt (!) 113.1 kg (249 lb 4.8 oz)  - SpO2 98%  - BMI 35.77 kg/m??      Exam  General: normal appearance  EYES: Anicteric sclerae.  ENT: Oropharynx moist.  RESP: Relaxed respiratory effort. Clear to auscultation without wheezes or crackles.   CV: Regular rate and rhythm. Normal S1 and S2. No murmurs or gallops.  No lower extremity edema. Posterior tibial pulses are 2+ and symmetric.  abd exam: non tender, no masses, no HSM ; no hernia palpated but left inguinal area is mildly tender to palpation.   MSK: No focal muscle tenderness.  SKIN: Appropriately warm and moist.  NEURO: Stable gait and coordination.    Allergies:     Chantix [varenicline] and Pollen extracts    Current Medications:     Current Outpatient Medications   Medication Sig Dispense Refill   ??? aspirin (ECOTRIN) 81 MG tablet Take 1 tablet (81 mg total) by mouth daily. 90 each 2   ??? atorvastatin (LIPITOR) 20 MG tablet Take 1 tablet (20 mg total) by mouth daily. 1/2 a tablet a day 30 tablet 5   ??? bictegrav-emtricit-tenofov ala (BIKTARVY) 50-200-25 mg tablet Take 1 tablet by mouth daily. 90 tablet 5   ??? chlorhexidine (PERIDEX) 0.12 % solution 15 mL by Mouth route Two (2) times a day. Rinse and spit out excess. 900 mL 5   ??? furosemide (LASIX) 20 MG tablet 20 mg - 1 tablet QOD (Patient taking differently: Take 20 mg by mouth. Daily prn) 30 tablet 1   ??? gabapentin (NEURONTIN) 600 MG tablet Take 1.5 tablets (900 mg total) by mouth Three (3) times a day. (Patient taking differently: Take 600 mg by mouth Three (3) times a day. ) 135 tablet 5   ??? hydroCHLOROthiazide (HYDRODIURIL) 25 MG tablet Take 1 tablet (25 mg total) by mouth daily. TAKE 1 TABLET(25 MG) BY MOUTH DAILY 90 tablet 2   ??? lisinopriL (PRINIVIL,ZESTRIL) 40 MG tablet Take 1 tablet (40 mg total) by mouth daily. 90 tablet 2   ??? mirtazapine (REMERON) 30 MG tablet Take 1 tablet (30 mg total) by mouth nightly. 30 tablet 5   ??? naltrexone (DEPADE) 50 mg tablet Take 1 tablet (50 mg total) by mouth daily. 30 tablet 3   ??? nicotine (NICODERM CQ) 21 mg/24 hr patch Place 1 patch on the skin daily. 28 patch 5   ??? nicotine (NICOTROL) 10 mg inhaler Dosage: 1 cartridge q 1-2 hrs Frequency: 6-16 cartridges/day 300 each 2   ??? omeprazole (PRILOSEC) 20 MG capsule Take 1 capsule (20 mg total) by mouth daily. TAKE 1 CAPSULE(20 MG) BY MOUTH DAILY 30 capsule 3   ??? sertraline (ZOLOFT) 100 MG tablet TAKE 2 TABLETS(200 MG) BY MOUTH DAILY 60 tablet 5     No current facility-administered medications for this visit.  Note - This record has been created using [employer]. Chart creation errors have been sought, but may not always have been located. Such creation errors do not reflect on the standard of medical care.    Jenell Milliner, MD

## 2020-05-11 NOTE — Unmapped (Signed)
Referral Services Note     Duration of Intervention: < 5 min     REASON/TYPE OF CONTACT: Voicemail, Received voicemail from [facility] discussing difficulty with contact with patient to set up follow up appointment for MAT services     SW will followed up with patient regarding any barriers to set up appointment.     Jaevion Goto LCSW, LCAS  [facility]   Direct: [phone]

## 2020-05-15 NOTE — Unmapped (Signed)
Referral Service Attempt    Duration of Intervention: < 5 min     TYPE OF CONTACT: Phone, SW attempted to follow up about [facility] referral.     SW attempted to follow up on provider referral for Substance Use Care Coordination and/or Education     Pt non-responsive to outreach. This is attempt one of three to contact the patient.     Referring provider has been copied on this note to update on status of referral.    Lauretta Chester, LCAS  [facility]   Direct: [phone]

## 2020-05-15 NOTE — Unmapped (Signed)
Pt called stating he is a lot of pain. R/t suspected hernia. Pt states has missed work two days now as if feels like he is being  kicked in the balls, excuse my language pt curious if there they should double on naprosyn  Or if there was anything that could be sent to preferred pharmacy that could  get pt to next Thursday for scheduled [place]. PCP notified

## 2020-05-18 NOTE — Unmapped (Signed)
Made third and final attempt to provide follow-up counseling to Larry Montes, 59 y.o. for treatment of tobacco use/dependence. Pt has been re-enrolled in automated call program to provide future opportunities for engagement with Tobacco Treatment Program.    Tobacco Use Treatment  Program: [facility]  Type of Visit: Attempt  Tobacco Use Treatment Visit: Patient not available (LVM x3)    Cancer Center Patients  Primary Service: Other (TA)  Primary Cancer Type: N/A    Tobacco Use During Past 30 Days  Type of Tobacco Products Used: Cigarettes    TTS Information  TTS Visit Length: Less than 3 minutes    Doristine Devoid  Tobacco Treatment Counselor  [facility]  [phone]

## 2020-05-21 ENCOUNTER — Ambulatory Visit: Admit: 2020-05-21 | Discharge: 2020-05-22 | Payer: PRIVATE HEALTH INSURANCE

## 2020-05-21 DIAGNOSIS — R1030 Lower abdominal pain, unspecified: Secondary | ICD-10-CM | POA: Diagnosis not present

## 2020-05-21 DIAGNOSIS — K4090 Unilateral inguinal hernia, without obstruction or gangrene, not specified as recurrent: Secondary | ICD-10-CM | POA: Diagnosis not present

## 2020-05-22 NOTE — Unmapped (Signed)
[place] confirms the a small fat containing inguinal hernia on the left side.  If he continues to have problems, he may be referred to surgery for evaluation.

## 2020-05-27 NOTE — Unmapped (Unsigned)
Referral Services Note     Duration of Intervention:  5 min     REASON/TYPE OF CONTACT: Phone, SW followed up with patient regarding referral to [facility]     ASSESSMENT: Patient did not answer. Later patient called back and discussed that he has been in contact with [facility] and they were trying to find an appointment time that would work with everyone's schedules. Patient agreed to follow up with them today to reschedule the appointment.     INTERVENTION:  SW followed up with coordination of care with mental health     PLAN:  Patient will reach out to this social worker if he needs anymore support connecting with [facility].     Shavonne Ambroise LCSW, LCAS  [facility] Social Work   Direct: [phone]

## 2020-05-29 NOTE — Unmapped (Signed)
Requested Prescriptions     Pending Prescriptions Disp Refills   ??? sertraline (ZOLOFT) 100 MG tablet [Pharmacy Med Name: SERTRALINE 100MG  TABLETS] 60 tablet 5     Sig: TAKE 2 TABLETS(200 MG) BY MOUTH DAILY     LOV:05/11/20     If approved please send to pharmacy selected. sct

## 2020-05-31 MED ORDER — SERTRALINE 100 MG TABLET
ORAL_TABLET | 5 refills | 0 days | Status: CP
Start: 2020-05-31 — End: ?

## 2020-06-01 DIAGNOSIS — F1020 Alcohol dependence, uncomplicated: Secondary | ICD-10-CM | POA: Diagnosis not present

## 2020-06-10 DIAGNOSIS — F1020 Alcohol dependence, uncomplicated: Secondary | ICD-10-CM | POA: Diagnosis not present

## 2020-06-11 ENCOUNTER — Encounter: Admit: 2020-06-11 | Discharge: 2020-06-12 | Payer: PRIVATE HEALTH INSURANCE

## 2020-06-11 DIAGNOSIS — M5441 Lumbago with sciatica, right side: Secondary | ICD-10-CM | POA: Diagnosis not present

## 2020-06-11 DIAGNOSIS — F418 Other specified anxiety disorders: Secondary | ICD-10-CM | POA: Diagnosis not present

## 2020-06-11 DIAGNOSIS — G8929 Other chronic pain: Secondary | ICD-10-CM | POA: Diagnosis not present

## 2020-06-11 DIAGNOSIS — E669 Obesity, unspecified: Secondary | ICD-10-CM | POA: Diagnosis not present

## 2020-06-11 DIAGNOSIS — Z0000 Encounter for general adult medical examination without abnormal findings: Principal | ICD-10-CM

## 2020-06-11 DIAGNOSIS — M5442 Lumbago with sciatica, left side: Secondary | ICD-10-CM

## 2020-06-11 MED ORDER — MIRTAZAPINE 15 MG TABLET
ORAL_TABLET | 0 refills | 0 days | Status: CP
Start: 2020-06-11 — End: ?

## 2020-06-11 NOTE — Unmapped (Unsigned)
Subjective:     HPI: Larry Montes is a 59 y.o. male here for   Chief Complaint   Patient presents with   ??? Establish Care     concerned about weight gain   .    HPI    Has obesity. Weight gain X 4 years. Collectively, Has gained 6 lbs since 07/2019. Not exercising.      Has chronic anxiety, depression that is controlled on current regimen.     Is with chronic LBP. Pain reasonably controlled on current regimen. Wants to see with PT.     I have reviewed past medical, surgical, medications, allergies, social and family histories today and updated them in Epic where appropriate.    ROS:     Constitutional:  Denies  unexpected weight loss or gain, or weakness   HEENT:  Denies visual changes, denies ear pain, sore throat, neck pain  Respiratory:  Denies cough or shortness of breath. No change in exercise  tolerance  Cardiovascular:  Denies chest pain, palpitations or lower extremity swelling   GI:  Denies abdominal pain, diarrhea, constipation, melena, or hematochezia  GU: Denies dysuria, polyuria, hematuria   Musculoskeletal:  Denies back pain, myalgias, joint pain, joint swelling  Skin:  Denies rash, pruritis  Neurologic:  Denies headache, focal weakness or numbness, tingling  Endocrine:  Denies polyuria or polydypsia, denies cold intolerance   Psychiatric:  See HPI.     Allergies:     Chantix [varenicline] and Pollen extracts       Objective:     Vitals:    06/11/20 1339   BP: 131/79   Pulse: 85   Temp: 36.1 ??C (97 ??F)   SpO2: 98%     Body mass index is 35.61 kg/m??.    Physical Exam        Assessment and Plan:     {No diagnosis found. (Refresh or delete this SmartLink)}    Tapering to DC remeron (appropriate way to to do this reviewed/recommended).     No follow-ups on file.        Michaele Offer, FNP            [facility]:     Medication adherence and barriers to the treatment plan have been addressed. Opportunities to optimize healthy behaviors have been discussed. Patient / caregiver voiced understanding.

## 2020-06-12 DIAGNOSIS — F329 Major depressive disorder, single episode, unspecified: Secondary | ICD-10-CM | POA: Diagnosis not present

## 2020-06-12 DIAGNOSIS — F1020 Alcohol dependence, uncomplicated: Secondary | ICD-10-CM | POA: Diagnosis not present

## 2020-06-12 DIAGNOSIS — F4310 Post-traumatic stress disorder, unspecified: Secondary | ICD-10-CM | POA: Diagnosis not present

## 2020-06-17 NOTE — Unmapped (Signed)
Spoke w/pt and he stated he doesn't have any mood issues or concerns w/sleep apnea. He states he will discuss OSA at OV scheduled for 12-17. He stated that he will not be getting a CPAP or APAP machine.

## 2020-06-17 NOTE — Unmapped (Signed)
Reviewed recent weight management intake form. Looks like he may be with OSA. Needs an OV to discuss this symptom complex. Additionally, if he is with any mood concerns that are uncontrolled- I will address those @ that OV. If narcolepsy or any other acute change in condition, have pt seek immediate medical attention. Notify and arrange.

## 2020-06-18 NOTE — Unmapped (Signed)
called patient and left vm to schedule follow up appt

## 2020-06-24 DIAGNOSIS — M5441 Lumbago with sciatica, right side: Principal | ICD-10-CM

## 2020-06-24 DIAGNOSIS — G8929 Other chronic pain: Principal | ICD-10-CM

## 2020-06-24 DIAGNOSIS — M5442 Lumbago with sciatica, left side: Principal | ICD-10-CM

## 2020-06-24 NOTE — Unmapped (Signed)
See discussion. Please assist with urgent referral.

## 2020-06-25 DIAGNOSIS — M5117 Intervertebral disc disorders with radiculopathy, lumbosacral region: Secondary | ICD-10-CM | POA: Diagnosis not present

## 2020-06-25 DIAGNOSIS — M5106 Intervertebral disc disorders with myelopathy, lumbar region: Secondary | ICD-10-CM | POA: Diagnosis not present

## 2020-06-25 NOTE — Unmapped (Signed)
Noted  

## 2020-06-26 NOTE — Unmapped (Signed)
Please send to different neurosurgeon as inference is that he does not want to wait. Needs to be evaluated within the week by specialist if possible (2 weeks permissible due to holiday week that is up and coming).

## 2020-06-26 NOTE — Unmapped (Signed)
Does not have spine specialist appt in place. Are we working towards this? Needs to be evaluated within the week.

## 2020-06-29 NOTE — Unmapped (Signed)
LVM for pt to return call abt [facility].

## 2020-06-29 NOTE — Unmapped (Signed)
If not addressed, please do so today.

## 2020-06-29 NOTE — Unmapped (Signed)
Currently working on it.  

## 2020-06-29 NOTE — Unmapped (Signed)
[facility] # [phone] spoke w/Jen and referral order was faxed over. Working on scheduling pt for 11-23 w/PA at their office.

## 2020-06-29 NOTE — Unmapped (Signed)
FYI: [facility] called and stated that they are out of network with pt. Insurance [facility]) . If you have any questions call Jenn at  [phone]

## 2020-06-30 DIAGNOSIS — Z720 Tobacco use: Principal | ICD-10-CM

## 2020-06-30 MED ORDER — NICOTINE 21 MG/24 HR DAILY TRANSDERMAL PATCH
MEDICATED_PATCH | 5 refills | 0 days
Start: 2020-06-30 — End: ?

## 2020-07-01 ENCOUNTER — Encounter
Admit: 2020-07-01 | Discharge: 2020-07-02 | Payer: PRIVATE HEALTH INSURANCE | Attending: Physical Medicine & Rehabilitation | Primary: Physical Medicine & Rehabilitation

## 2020-07-01 ENCOUNTER — Other Ambulatory Visit: Payer: Self-pay

## 2020-07-01 ENCOUNTER — Emergency Department: Payer: BLUE CROSS/BLUE SHIELD

## 2020-07-01 ENCOUNTER — Emergency Department
Admission: EM | Admit: 2020-07-01 | Discharge: 2020-07-01 | Disposition: A | Discharge status: Home / Self Care | Payer: BLUE CROSS/BLUE SHIELD | Attending: Emergency Medicine | Admitting: Emergency Medicine

## 2020-07-01 DIAGNOSIS — M5136 Other intervertebral disc degeneration, lumbar region: Secondary | ICD-10-CM | POA: Diagnosis not present

## 2020-07-01 DIAGNOSIS — Z7901 Long term (current) use of anticoagulants: Secondary | ICD-10-CM | POA: Diagnosis not present

## 2020-07-01 DIAGNOSIS — M5416 Radiculopathy, lumbar region: Secondary | ICD-10-CM | POA: Diagnosis not present

## 2020-07-01 DIAGNOSIS — I10 Essential (primary) hypertension: Secondary | ICD-10-CM | POA: Insufficient documentation

## 2020-07-01 DIAGNOSIS — M5441 Lumbago with sciatica, right side: Secondary | ICD-10-CM | POA: Diagnosis not present

## 2020-07-01 DIAGNOSIS — Z6835 Body mass index (BMI) 35.0-35.9, adult: Secondary | ICD-10-CM | POA: Diagnosis not present

## 2020-07-01 DIAGNOSIS — M5442 Lumbago with sciatica, left side: Secondary | ICD-10-CM | POA: Diagnosis not present

## 2020-07-01 DIAGNOSIS — F17210 Nicotine dependence, cigarettes, uncomplicated: Secondary | ICD-10-CM | POA: Diagnosis not present

## 2020-07-01 DIAGNOSIS — Z8673 Personal history of transient ischemic attack (TIA), and cerebral infarction without residual deficits: Secondary | ICD-10-CM | POA: Diagnosis not present

## 2020-07-01 DIAGNOSIS — Z21 Asymptomatic human immunodeficiency virus [HIV] infection status: Secondary | ICD-10-CM | POA: Diagnosis not present

## 2020-07-01 DIAGNOSIS — M4800 Spinal stenosis, site unspecified: Secondary | ICD-10-CM | POA: Diagnosis not present

## 2020-07-01 DIAGNOSIS — M545 Low back pain, unspecified: Secondary | ICD-10-CM | POA: Diagnosis not present

## 2020-07-01 DIAGNOSIS — M549 Dorsalgia, unspecified: Secondary | ICD-10-CM | POA: Diagnosis not present

## 2020-07-01 DIAGNOSIS — Z7982 Long term (current) use of aspirin: Secondary | ICD-10-CM | POA: Diagnosis not present

## 2020-07-01 DIAGNOSIS — G8929 Other chronic pain: Secondary | ICD-10-CM | POA: Diagnosis not present

## 2020-07-01 DIAGNOSIS — G834 Cauda equina syndrome: Principal | ICD-10-CM

## 2020-07-01 MED ORDER — ORPHENADRINE CITRATE 30 MG/ML IJ SOLN
60.0000 mg | Freq: Two times a day (BID) | INTRAMUSCULAR | Status: DC
  Administered 2020-07-01: 60 mg via INTRAMUSCULAR
  Filled 2020-07-01: qty 2

## 2020-07-01 MED ORDER — METHYLPREDNISOLONE SODIUM SUCC 125 MG IJ SOLR
125.0000 mg | Freq: Once | INTRAMUSCULAR | Status: AC
  Administered 2020-07-01: 125 mg via INTRAMUSCULAR
  Filled 2020-07-01: qty 2

## 2020-07-01 MED ORDER — METHYLPREDNISOLONE 4 MG PO TBPK: ORAL_TABLET | ORAL | 0 refills | Status: DC

## 2020-07-01 MED ORDER — ORPHENADRINE CITRATE ER 100 MG PO TB12: 100.0000 mg | ORAL_TABLET | Freq: Two times a day (BID) | ORAL | 0 refills | Status: DC

## 2020-07-01 MED ORDER — METHYLPREDNISOLONE 4 MG TABLETS IN A DOSE PACK
0 refills | 0 days | Status: CP
Start: 2020-07-01 — End: ?

## 2020-07-01 MED ORDER — NICOTINE 21 MG/24 HR DAILY TRANSDERMAL PATCH
MEDICATED_PATCH | 0 refills | 0 days | Status: CP
Start: 2020-07-01 — End: 2020-08-03
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
## 2020-07-01 NOTE — Unmapped (Addendum)
Thanks so much for coming to see Dr. Riccardo Dubin today. It was a pleasure to meet you. This summary reviews the goals and plans we discussed at your visit today.     Below, you will see: a) your working diagnosis b) your treatment plan and c) your next steps and followup plan.    We care about your quality of life and are committed to helping optimize your functionality.     DIAGNOSIS:   Spinal stenosis and neurogenic claudication with New numbness below the waist in both legs and groin.  New right radicular pain.  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 I recommend seeing ER as soon as possible, however patient defers at this time. For this reason I ordered MRI lumbar spine. I discussed with him urgency of work up of these symptoms and to go to the ED if any worsening numbness (constant), bowel/bladder changes or weakness.    Hold physical therapy at this time.    Ordered steroid medrol dosepack. Do not take Advil or Aleve or other NSAIDs (naproxen) while taking steroids.        NEXT STEPS/FOLLOW UP: mychart message after MRI

## 2020-07-01 NOTE — Unmapped (Signed)
Last OV 08/22/19

## 2020-07-01 NOTE — ED Notes (Signed)
Pt ambulatory to room. Pt c/o increase lumbar pain that is chronic from degenerative disease but has gotten worse. Pt states no relief from at home medication

## 2020-07-01 NOTE — Discharge Instructions (Signed)
Follow discharge care instruction take medication as directed. °

## 2020-07-01 NOTE — ED Provider Notes (Signed)
[facility] Emergency Department Provider Note   ____________________________________________   First MD Initiated Contact with Patient 07/01/20 1650     (approximate)  I have reviewed the triage vital signs and the nursing notes.   HISTORY  Chief Complaint Back Pain    HPI Daryl Shelton is a 59 y.o. male patient Presents with increasing radicular back pain to the bilateral lower extremities.  Patient pain radiates to posterior thighs.  Pain has increased in the past 2 weeks.  No specific provocative incident for complaint.  Denies cauda equina components of his back pain.  Patient rates pain as 3/10.  No relief over-the-counter medications.      Past Medical History:  Diagnosis Date  . Anxiety   . Bell's palsy   . Depression   . HIV (human immunodeficiency virus infection) ([place])   . Hypertension     Patient Active Problem List   Diagnosis Date Noted  . TIA (transient ischemic attack) 05/04/2016    History reviewed. No pertinent surgical history.  Prior to Admission medications   Medication Sig Start Date End Date Taking? Authorizing Provider  aspirin EC 81 MG tablet Take 81 mg by mouth daily.    [provider]  bictegravir-emtricitabine-tenofovir AF (BIKTARVY) 50-200-25 MG TABS tablet Take by mouth. 09/13/17   [provider]  furosemide (LASIX) 20 MG tablet Take 1 tablet (20 mg total) by mouth daily for 5 days. 10/02/19 10/07/19  Delman Kitten, MD  gabapentin (NEURONTIN) 600 MG tablet Take 600 mg by mouth 3 (three) times daily.    [provider]  hydrochlorothiazide (HYDRODIURIL) 25 MG tablet Take 25 mg by mouth daily.    [provider]  HYDROcodone-homatropine (HYCODAN) 5-1.5 MG/5ML syrup Take 5 mLs by mouth every 6 (six) hours as needed. 10/10/18   Coral Spikes, DO  lisinopril (PRINIVIL,ZESTRIL) 40 MG tablet Take 40 mg by mouth daily.    [provider]  methylPREDNISolone (MEDROL DOSEPAK) 4 MG  TBPK tablet Take Tapered dose as directed 07/01/20   Sable Feil, PA-C  mirtazapine (REMERON) 30 MG tablet Take 30 mg by mouth at bedtime.    [provider]  omeprazole (PRILOSEC) 20 MG capsule Take 20 mg by mouth daily.    [provider]  orphenadrine (NORFLEX) 100 MG tablet Take 1 tablet (100 mg total) by mouth 2 (two) times daily. 07/01/20   Sable Feil, PA-C  oseltamivir (TAMIFLU) 75 MG capsule Take 1 capsule (75 mg total) by mouth every 12 (twelve) hours. 10/10/18   Coral Spikes, DO  pravastatin (PRAVACHOL) 40 MG tablet Take 40 mg by mouth daily.    [provider]  sertraline (ZOLOFT) 100 MG tablet Take 200 mg by mouth daily.    [provider]    Allergies Varenicline and Pollen extract  Family History  Problem Relation Age of Onset  . CAD Father     Social History Social History   Tobacco Use  . Smoking status: Current Every Day Smoker    Packs/day: 0.50    Types: Cigarettes  . Smokeless tobacco: Never Used  Vaping Use  . Vaping Use: Never used  Substance Use Topics  . Alcohol use: Yes  . Drug use: No    Review of Systems  Constitutional: No fever/chills Eyes: No visual changes. ENT: No sore throat. Cardiovascular: Denies chest pain. Respiratory: Denies shortness of breath. Gastrointestinal: No abdominal pain.  No nausea, no vomiting.  No diarrhea.  No  constipation. Genitourinary: Negative for dysuria. Musculoskeletal: Positive for back pain. Skin: Negative for rash. Neurological: Negative for headaches, focal weakness or numbness. Psychiatric:  Anxiety and depression. Endocrine:  Hypertension. Hematological/Lymphatic:  Allergic/Immunilogical: HIV. ____________________________________________   PHYSICAL EXAM:  VITAL SIGNS: ED Triage Vitals [07/01/20 1648]  Enc Vitals Group     BP (!) 131/53     Pulse Rate (!) 102     Resp 18     Temp 98 F (36.7 C)     Temp src      SpO2 98 %     Weight 247 lb (112 kg)      Height 5\' 10"  (1.778 m)     Head Circumference      Peak Flow      Pain Score 3     Pain Loc      Pain Edu?      Excl. in [place]?    Constitutional: Alert and oriented. Well appearing and in no acute distress. Neck:No cervical spine tenderness to palpation. Hematological/Lymphatic/Immunilogical: No cervical lymphadenopathy. Cardiovascular: Normal rate, regular rhythm. Grossly normal heart sounds.  Good peripheral circulation. Respiratory: Normal respiratory effort.  No retractions. Lungs CTAB. Gastrointestinal: Soft and nontender. No distention. No abdominal bruits. No CVA tenderness. Genitourinary: Deferred Musculoskeletal: No lower extremity tenderness nor edema.  No joint effusions. Neurologic:  Normal speech and language. No gross focal neurologic deficits are appreciated. No gait instability. Skin:  Skin is warm, dry and intact. No rash noted. Psychiatric: Mood and affect are normal. Speech and behavior are normal.  ____________________________________________   LABS (all labs ordered are listed, but only abnormal results are displayed)  Labs Reviewed - No data to display ____________________________________________  EKG   ____________________________________________  RADIOLOGY I, Sable Feil, personally viewed and evaluated these images (plain radiographs) as part of my medical decision making, as well as reviewing the written report by the radiologist.  ED MD interpretation: Degenerative changes of the lumbar spine.  Official radiology report(s): DG Lumbar Spine Complete  Result Date: 07/01/2020 CLINICAL DATA:  Increased radicular pain for 2 weeks. EXAM: LUMBAR SPINE - COMPLETE 4+ VIEW COMPARISON:  None. FINDINGS: Five non-rib-bearing lumbar vertebral bodies are noted. There is no evidence of lumbar spine fracture.  Alignment is normal. Multilevel moderate degenerative changes of the spine including osteophyte formation and facet arthropathy. Multilevel moderate  intervertebral disc space narrowing. IMPRESSION: No acute displaced fracture or traumatic listhesis of the lumbar spine. Electronically Signed   By: Iven Finn M.D.   On: 07/01/2020 17:55    ____________________________________________   PROCEDURES  Procedure(s) performed (including Critical Care):  Procedures   ____________________________________________   INITIAL IMPRESSION / ASSESSMENT AND PLAN / ED COURSE  As part of my medical decision making, I reviewed the following data within the [place]         Patient presents with radicular pain to the lower extremities.  Discussed x-ray findings with patient consistent with degenerative changes lumbar spine.  Patient given discharge care instruction advised to follow home station orthopedic for definitive evaluation and treatment.      ____________________________________________   FINAL CLINICAL IMPRESSION(S) / ED DIAGNOSES  Final diagnoses:  Lumbar radiculopathy  DDD (degenerative disc disease), lumbar     ED Discharge Orders         Ordered    methylPREDNISolone (MEDROL DOSEPAK) 4 MG TBPK tablet        07/01/20 1811    orphenadrine (NORFLEX) 100 MG tablet  2 times daily  07/01/20 1811          *Please note:  Daryl Shelton was evaluated in Emergency Department on 07/01/2020 for the symptoms described in the history of present illness. He was evaluated in the context of the global COVID-19 pandemic, which necessitated consideration that the patient might be at risk for infection with the SARS-CoV-2 virus that causes COVID-19. Institutional protocols and algorithms that pertain to the evaluation of patients at risk for COVID-19 are in a state of rapid change based on information released by regulatory bodies including the CDC and federal and state organizations. These policies and algorithms were followed during the patient's care in the ED.  Some ED evaluations and interventions may  be delayed as a result of limited staffing during and the pandemic.*   Note:  This document was prepared using Dragon voice recognition software and may include unintentional dictation errors.    Sable Feil, PA-C 07/01/20 1815    Lucrezia Starch, MD 07/01/20 [phone]

## 2020-07-01 NOTE — ED Triage Notes (Signed)
Pt comes via POV from home with c/o lumbar pain. Pt states pain has been going on but it has gotten worse. Pt unsure if it is sciatic pain. Pt states last two weeks it has gotten worse.  Pt states he has tried medication with no relief. Pt denies any recent injuries.

## 2020-07-02 NOTE — Unmapped (Signed)
CJ pt  74mo sent

## 2020-07-08 NOTE — Unmapped (Signed)
Called patient regarding pcap re-enrollment for 2022. Patient was recently married and will be on his partner's insurance starting 08/08/2020.      Larry Montes  ID Benefits Counselor  Time of Intervention: 

## 2020-07-14 NOTE — Unmapped (Signed)
VM and MyChart message left with instructions and phone numbers.

## 2020-07-14 NOTE — Unmapped (Signed)
A VM was left for the patient to let him know that a MRI referral was put in for him on 11/24 to have done in the [facility] system.  Number given to call and schedule.

## 2020-07-27 NOTE — Unmapped (Signed)
err

## 2020-07-27 NOTE — Unmapped (Signed)
The radiologist will read the scan the day of or the next day depending on what time it is done.  It will be posted to your MyChart file and you will be able to see the results the same time we are able.  The doctor usually gets a message when it is read.  I don't get that message.  We are so glad you are getting this done.  Have any of the s/s changed?  Are you still able to control your bladder and bowels?  Please describe the pain and numbness as exactly as you can with specifics again as they can change.  I will route to Dr Riccardo Dubin.  Norman Herrlich, RN

## 2020-07-29 ENCOUNTER — Encounter: Admit: 2020-07-29 | Discharge: 2020-07-30 | Payer: PRIVATE HEALTH INSURANCE

## 2020-07-29 DIAGNOSIS — M4800 Spinal stenosis, site unspecified: Secondary | ICD-10-CM | POA: Diagnosis not present

## 2020-07-29 DIAGNOSIS — M5136 Other intervertebral disc degeneration, lumbar region: Secondary | ICD-10-CM | POA: Diagnosis not present

## 2020-07-29 DIAGNOSIS — M48061 Spinal stenosis, lumbar region without neurogenic claudication: Secondary | ICD-10-CM | POA: Diagnosis not present

## 2020-07-29 DIAGNOSIS — M4807 Spinal stenosis, lumbosacral region: Secondary | ICD-10-CM | POA: Diagnosis not present

## 2020-07-29 NOTE — Unmapped (Signed)
Addended by: Valeda Malm on: 07/29/2020 03:21 PM     Modules accepted: Orders

## 2020-08-03 DIAGNOSIS — Z720 Tobacco use: Principal | ICD-10-CM

## 2020-08-03 MED ORDER — NICOTINE 21 MG/24 HR DAILY TRANSDERMAL PATCH
MEDICATED_PATCH | 0 refills | 0 days | Status: CP
Start: 2020-08-03 — End: 2020-09-03

## 2020-08-04 NOTE — Unmapped (Signed)
Sent patient a MyChart message today

## 2020-08-06 DIAGNOSIS — F4322 Adjustment disorder with anxiety: Secondary | ICD-10-CM | POA: Diagnosis not present
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
## 2020-08-20 DIAGNOSIS — F4322 Adjustment disorder with anxiety: Secondary | ICD-10-CM | POA: Diagnosis not present

## 2020-08-27 DIAGNOSIS — F4322 Adjustment disorder with anxiety: Secondary | ICD-10-CM | POA: Diagnosis not present

## 2020-09-03 DIAGNOSIS — F4322 Adjustment disorder with anxiety: Secondary | ICD-10-CM | POA: Diagnosis not present

## 2020-09-03 DIAGNOSIS — Z720 Tobacco use: Principal | ICD-10-CM

## 2020-09-03 MED ORDER — NICOTINE 21 MG/24 HR DAILY TRANSDERMAL PATCH
MEDICATED_PATCH | 0 refills | 0 days | Status: CP
Start: 2020-09-03 — End: ?

## 2020-09-09 MED ORDER — ATORVASTATIN 20 MG TABLET
ORAL_TABLET | 0 refills | 0 days | Status: CP
Start: 2020-09-09 — End: ?

## 2020-09-09 NOTE — Unmapped (Signed)
Hi robin,     I called paitent and left vm , also sent well text notification and added patient to recall       Thanks Porchia

## 2020-09-09 NOTE — Unmapped (Signed)
called patient and left message to schedule follow up with eric langhans , sent well text notification

## 2020-09-09 NOTE — Unmapped (Signed)
Refill request: Atorvastatin. Last visit: 02/04/20. Last labs: 02/04/20.

## 2020-09-10 DIAGNOSIS — F4322 Adjustment disorder with anxiety: Secondary | ICD-10-CM | POA: Diagnosis not present

## 2020-09-16 DIAGNOSIS — M1990 Unspecified osteoarthritis, unspecified site: Secondary | ICD-10-CM | POA: Diagnosis not present

## 2020-09-16 DIAGNOSIS — Z1389 Encounter for screening for other disorder: Secondary | ICD-10-CM | POA: Diagnosis not present

## 2020-09-16 DIAGNOSIS — E785 Hyperlipidemia, unspecified: Secondary | ICD-10-CM | POA: Diagnosis not present

## 2020-09-16 DIAGNOSIS — F320 Major depressive disorder, single episode, mild: Secondary | ICD-10-CM | POA: Diagnosis not present

## 2020-09-16 DIAGNOSIS — B20 Human immunodeficiency virus [HIV] disease: Secondary | ICD-10-CM | POA: Diagnosis not present

## 2020-09-16 DIAGNOSIS — I10 Essential (primary) hypertension: Secondary | ICD-10-CM | POA: Diagnosis not present

## 2020-09-17 DIAGNOSIS — F4322 Adjustment disorder with anxiety: Secondary | ICD-10-CM | POA: Diagnosis not present

## 2020-09-23 DIAGNOSIS — F4322 Adjustment disorder with anxiety: Secondary | ICD-10-CM | POA: Diagnosis not present

## 2020-09-29 DIAGNOSIS — F4322 Adjustment disorder with anxiety: Secondary | ICD-10-CM | POA: Diagnosis not present

## 2020-10-05 DIAGNOSIS — F4322 Adjustment disorder with anxiety: Secondary | ICD-10-CM | POA: Diagnosis not present

## 2020-10-08 DIAGNOSIS — F4322 Adjustment disorder with anxiety: Secondary | ICD-10-CM | POA: Diagnosis not present

## 2020-10-08 DIAGNOSIS — Z720 Tobacco use: Principal | ICD-10-CM

## 2020-10-08 MED ORDER — ATORVASTATIN 20 MG TABLET
ORAL_TABLET | 0 refills | 0.00000 days
Start: 2020-10-08 — End: ?

## 2020-10-08 MED ORDER — NICOTINE 21 MG/24 HR DAILY TRANSDERMAL PATCH
MEDICATED_PATCH | 0 refills | 0 days | Status: CP
Start: 2020-10-08 — End: ?

## 2020-10-10 NOTE — Unmapped (Signed)
Pharmacy is requesting medication refill on Lipitor on the behalf of the patient.

## 2020-10-12 MED ORDER — ATORVASTATIN 20 MG TABLET
ORAL_TABLET | 0 refills | 0.00000 days
Start: 2020-10-12 — End: ?

## 2020-10-12 NOTE — Unmapped (Signed)
called and left message to schedule follow up with eric for refill request

## 2020-10-15 DIAGNOSIS — F4322 Adjustment disorder with anxiety: Secondary | ICD-10-CM | POA: Diagnosis not present

## 2020-10-16 ENCOUNTER — Other Ambulatory Visit: Payer: Self-pay

## 2020-10-16 ENCOUNTER — Ambulatory Visit (INDEPENDENT_AMBULATORY_CARE_PROVIDER_SITE_OTHER): Payer: BC Managed Care – PPO | Admitting: Family Medicine

## 2020-10-16 ENCOUNTER — Encounter: Payer: Self-pay | Admitting: Family Medicine

## 2020-10-16 VITALS — BP 142/82 | HR 92 | Ht 70.0 in | Wt 249.3 lb

## 2020-10-16 DIAGNOSIS — B20 Human immunodeficiency virus [HIV] disease: Secondary | ICD-10-CM | POA: Diagnosis not present

## 2020-10-16 DIAGNOSIS — F1021 Alcohol dependence, in remission: Secondary | ICD-10-CM | POA: Insufficient documentation

## 2020-10-16 DIAGNOSIS — F1011 Alcohol abuse, in remission: Secondary | ICD-10-CM

## 2020-10-16 DIAGNOSIS — G894 Chronic pain syndrome: Secondary | ICD-10-CM

## 2020-10-16 DIAGNOSIS — F1020 Alcohol dependence, uncomplicated: Secondary | ICD-10-CM | POA: Insufficient documentation

## 2020-10-16 DIAGNOSIS — Z7689 Persons encountering health services in other specified circumstances: Secondary | ICD-10-CM | POA: Diagnosis not present

## 2020-10-16 DIAGNOSIS — G459 Transient cerebral ischemic attack, unspecified: Secondary | ICD-10-CM

## 2020-10-16 NOTE — Progress Notes (Signed)
Established Patient Office Visit  SUBJECTIVE:  Subjective  Patient ID: Daryl Shelton, male    DOB: 02/12/1961  Age: 60 y.o. MRN: [id number]  CC:  Chief Complaint  Patient presents with  . New Patient (Initial Visit)    HPI Daryl Shelton is a 60 y.o. male presenting today for     Past Medical History:  Diagnosis Date  . Anxiety   . Arthritis   . Bell's palsy   . Depression   . HIV (human immunodeficiency virus infection) ([place])   . Hypertension     History reviewed. No pertinent surgical history.  Family History  Problem Relation Age of Onset  . CAD Father     Social History   Socioeconomic History  . Marital status: Married    Spouse name: Not on file  . Number of children: Not on file  . Years of education: Not on file  . Highest education level: Not on file  Occupational History  . Not on file  Tobacco Use  . Smoking status: Current Every Day Smoker    Packs/day: 0.50    Types: Cigarettes  . Smokeless tobacco: Never Used  Vaping Use  . Vaping Use: Never used  Substance and Sexual Activity  . Alcohol use: Not Currently  . Drug use: No  . Sexual activity: Yes  Other Topics Concern  . Not on file  Social History Narrative  . Not on file   Social Determinants of Health   Financial Resource Strain: Not on file  Food Insecurity: Not on file  Transportation Needs: Not on file  Physical Activity: Not on file  Stress: Not on file  Social Connections: Not on file  Intimate Partner Violence: Not on file     Current Outpatient Medications:  .  aspirin EC 81 MG tablet, Take 81 mg by mouth daily., Disp: , Rfl:  .  bictegravir-emtricitabine-tenofovir AF (BIKTARVY) 50-200-25 MG TABS tablet, Take by mouth., Disp: , Rfl:  .  gabapentin (NEURONTIN) 600 MG tablet, Take 600 mg by mouth 3 (three) times daily., Disp: , Rfl:  .  hydrochlorothiazide (HYDRODIURIL) 25 MG tablet, Take 25 mg by mouth daily., Disp: , Rfl:  .  lisinopril (PRINIVIL,ZESTRIL) 40  MG tablet, Take 40 mg by mouth daily., Disp: , Rfl:  .  mirtazapine (REMERON) 30 MG tablet, Take 30 mg by mouth at bedtime., Disp: , Rfl:  .  omeprazole (PRILOSEC) 20 MG capsule, Take 20 mg by mouth daily., Disp: , Rfl:  .  pravastatin (PRAVACHOL) 40 MG tablet, Take 40 mg by mouth daily., Disp: , Rfl:  .  sertraline (ZOLOFT) 100 MG tablet, Take 200 mg by mouth daily., Disp: , Rfl:    Allergies  Allergen Reactions  . Varenicline Other (See Comments)    CNS symptoms - anxiety, agitation CNS symptoms - anxiety, agitation   . Pollen Extract Other (See Comments)    Congestion     ROS Review of Systems  Constitutional: Negative.   HENT: Negative.   Respiratory: Negative.   Cardiovascular: Negative.   Musculoskeletal: Positive for arthralgias and back pain.  Neurological: Positive for numbness.  Psychiatric/Behavioral: Negative.      OBJECTIVE:    Physical Exam Constitutional:      Appearance: Normal appearance.  HENT:     Right Ear: Tympanic membrane normal.     Left Ear: Tympanic membrane normal.     Mouth/Throat:     Mouth: Mucous membranes are moist.  Cardiovascular:  Rate and Rhythm: Normal rate and regular rhythm.     Pulses: Normal pulses.  Pulmonary:     Effort: Pulmonary effort is normal.  Musculoskeletal:        General: Tenderness present.     Cervical back: Normal range of motion.  Neurological:     Mental Status: He is alert.     BP (!) 142/82   Pulse 92   Ht 5\' 10"  (1.778 m)   Wt 249 lb 4.8 oz (113.1 kg)   BMI 35.77 kg/m  Wt Readings from Last 3 Encounters:  10/16/20 249 lb 4.8 oz (113.1 kg)  07/01/20 247 lb (112 kg)  10/02/19 250 lb (113.4 kg)    Health Maintenance Due  Topic Date Due  . Hepatitis C Screening  Never done  . COVID-19 Vaccine (1) Never done  . COLONOSCOPY (Pts 45-42yrs Insurance coverage will need to be confirmed)  Never done  . TETANUS/TDAP  02/20/2019  . INFLUENZA VACCINE  03/08/2020    There are no preventive care  reminders to display for this patient.  CBC Latest Ref Rng & Units 10/02/2019 05/05/2016 05/04/2016  WBC 4.0 - 10.5 K/uL 6.7 4.8 6.8  Hemoglobin 13.0 - 17.0 g/dL 13.9 15.3 15.3  Hematocrit 39.0 - 52.0 % 43.0 45.2 44.6  Platelets 150 - 400 K/uL 196 135(L) 148(L)   CMP Latest Ref Rng & Units 10/02/2019 05/05/2016 05/04/2016  Glucose 70 - 99 mg/dL 110(H) 90 100(H)  BUN 6 - 20 mg/dL 21(H) 17 22(H)  Creatinine 0.61 - 1.24 mg/dL 1.27(H) 1.21 1.23  Sodium 135 - 145 mmol/L 136 140 140  Potassium 3.5 - 5.1 mmol/L 3.9 4.4 4.4  Chloride 98 - 111 mmol/L 100 100(L) 102  CO2 22 - 32 mmol/L 25 33(H) 28  Calcium 8.9 - 10.3 mg/dL 9.5 9.7 9.5  Total Protein 6.5 - 8.1 g/dL - - 8.3(H)  Total Bilirubin 0.3 - 1.2 mg/dL - - 0.5  Alkaline Phos 38 - 126 U/L - - 60  AST 15 - 41 U/L - - 32  ALT 17 - 63 U/L - - 28    No results found for: TSH Lab Results  Component Value Date   ALBUMIN 4.5 05/04/2016   ANIONGAP 11 10/02/2019   Lab Results  Component Value Date   CHOL 215 (H) 05/04/2016   HDL 72 05/04/2016   LDLCALC UNABLE TO CALCULATE IF TRIGLYCERIDE OVER 400 mg/dL 05/04/2016   CHOLHDL 3.0 05/04/2016   Lab Results  Component Value Date   TRIG 488 (H) 05/04/2016   Lab Results  Component Value Date   HGBA1C 5.2 05/04/2016      ASSESSMENT & PLAN:   Problem List Items Addressed This Visit      Cardiovascular and Mediastinum   TIA (transient ischemic attack)     Other   Encounter to establish care - Primary    Pat to establish care today and has multiple medical conditions that are all well controlled. He stopped drinking 3 weeks ago.       Currently asymptomatic HIV infection, with history of HIV-related illness ([place])    Is seen by [facility] ID, well controlled in remission.       History of ETOH abuse    Patient has not had a drink in 3 weeks, does not admit to being an alcoholic but drank liquor multiple drinks per night. No detox sx. Will check labs next visit for PE>       Chronic pain  syndrome  Needs a ref to new Neurosurgeon due to change in insurance. He was being evaluated by [facility] neurosurgery and was offered surgery but has been doing PT at this time.       Relevant Orders   Ambulatory referral to Neurology      No orders of the defined types were placed in this encounter.     Follow-up: No follow-ups on file.    Beckie Salts, [place] [address] [place], [place] [address]

## 2020-10-16 NOTE — Assessment & Plan Note (Signed)
Is seen by [facility] ID, well controlled in remission.

## 2020-10-16 NOTE — Assessment & Plan Note (Signed)
Needs a ref to new Neurosurgeon due to change in insurance. He was being evaluated by [facility] neurosurgery and was offered surgery but has been doing PT at this time.

## 2020-10-16 NOTE — Assessment & Plan Note (Signed)
Pat to establish care today and has multiple medical conditions that are all well controlled. He stopped drinking 3 weeks ago.

## 2020-10-16 NOTE — Assessment & Plan Note (Signed)
Patient has not had a drink in 3 weeks, does not admit to being an alcoholic but drank liquor multiple drinks per night. No detox sx. Will check labs next visit for PE>

## 2020-10-22 DIAGNOSIS — F4322 Adjustment disorder with anxiety: Secondary | ICD-10-CM | POA: Diagnosis not present

## 2020-10-30 DIAGNOSIS — F4322 Adjustment disorder with anxiety: Secondary | ICD-10-CM | POA: Diagnosis not present

## 2020-11-03 DIAGNOSIS — F4322 Adjustment disorder with anxiety: Secondary | ICD-10-CM | POA: Diagnosis not present

## 2020-11-05 NOTE — Addendum Note (Signed)
Addended by: Alois Cliche on: 11/05/2020 03:15 PM   Modules accepted: Orders

## 2020-11-12 ENCOUNTER — Encounter: Payer: Self-pay | Admitting: Family Medicine

## 2020-11-13 DIAGNOSIS — F4322 Adjustment disorder with anxiety: Secondary | ICD-10-CM | POA: Diagnosis not present

## 2020-11-14 DIAGNOSIS — I10 Essential (primary) hypertension: Principal | ICD-10-CM

## 2020-11-14 DIAGNOSIS — Z720 Tobacco use: Principal | ICD-10-CM

## 2020-11-14 MED ORDER — HYDROCHLOROTHIAZIDE 25 MG TABLET
ORAL_TABLET | 2 refills | 0 days
Start: 2020-11-14 — End: ?

## 2020-11-14 MED ORDER — GABAPENTIN 600 MG TABLET
ORAL_TABLET | 5 refills | 0 days
Start: 2020-11-14 — End: ?

## 2020-11-14 MED ORDER — LISINOPRIL 40 MG TABLET
ORAL_TABLET | 2 refills | 0 days
Start: 2020-11-14 — End: ?

## 2020-11-14 MED ORDER — NICOTINE 21 MG/24 HR DAILY TRANSDERMAL PATCH
MEDICATED_PATCH | 0 refills | 0 days
Start: 2020-11-14 — End: ?

## 2020-11-16 ENCOUNTER — Other Ambulatory Visit: Payer: Self-pay

## 2020-11-16 DIAGNOSIS — M5416 Radiculopathy, lumbar region: Secondary | ICD-10-CM

## 2020-11-16 DIAGNOSIS — G894 Chronic pain syndrome: Secondary | ICD-10-CM

## 2020-11-16 MED ORDER — NICOTINE 21 MG/24 HR DAILY TRANSDERMAL PATCH
MEDICATED_PATCH | 0 refills | 0 days | Status: CP
Start: 2020-11-16 — End: ?

## 2020-11-16 NOTE — Progress Notes (Signed)
am

## 2020-11-17 ENCOUNTER — Other Ambulatory Visit: Payer: Self-pay | Admitting: *Deleted

## 2020-11-17 DIAGNOSIS — M48061 Spinal stenosis, lumbar region without neurogenic claudication: Secondary | ICD-10-CM

## 2020-11-17 NOTE — Progress Notes (Signed)
b neuro

## 2020-11-19 DIAGNOSIS — F4322 Adjustment disorder with anxiety: Secondary | ICD-10-CM | POA: Diagnosis not present

## 2020-11-23 DIAGNOSIS — F4322 Adjustment disorder with anxiety: Secondary | ICD-10-CM | POA: Diagnosis not present

## 2020-11-23 DIAGNOSIS — B20 Human immunodeficiency virus [HIV] disease: Principal | ICD-10-CM

## 2020-11-23 MED ORDER — BIKTARVY 50 MG-200 MG-25 MG TABLET
ORAL_TABLET | Freq: Every day | ORAL | 0 refills | 30 days
Start: 2020-11-23 — End: ?

## 2020-11-23 NOTE — Unmapped (Addendum)
Pharmacy requesting medication refill for Biktarvy #30.Patient last office visit 02/04/20.  Called patient no answer left voicemail with detail message for him to call and make a follow up appointment.

## 2020-11-27 ENCOUNTER — Encounter: Payer: Self-pay | Admitting: Internal Medicine

## 2020-11-27 ENCOUNTER — Other Ambulatory Visit: Payer: Self-pay

## 2020-11-27 ENCOUNTER — Ambulatory Visit (INDEPENDENT_AMBULATORY_CARE_PROVIDER_SITE_OTHER): Payer: BC Managed Care – PPO | Admitting: Internal Medicine

## 2020-11-27 VITALS — BP 131/80 | HR 91 | Ht 70.0 in | Wt 238.0 lb

## 2020-11-27 DIAGNOSIS — M48061 Spinal stenosis, lumbar region without neurogenic claudication: Secondary | ICD-10-CM

## 2020-11-27 DIAGNOSIS — B20 Human immunodeficiency virus [HIV] disease: Secondary | ICD-10-CM

## 2020-11-27 DIAGNOSIS — I10 Essential (primary) hypertension: Secondary | ICD-10-CM

## 2020-11-27 DIAGNOSIS — M4807 Spinal stenosis, lumbosacral region: Secondary | ICD-10-CM

## 2020-11-27 DIAGNOSIS — F1011 Alcohol abuse, in remission: Secondary | ICD-10-CM | POA: Diagnosis not present

## 2020-11-27 DIAGNOSIS — B079 Viral wart, unspecified: Secondary | ICD-10-CM | POA: Insufficient documentation

## 2020-11-27 NOTE — Assessment & Plan Note (Signed)
Patient states that he has stopped drinking alcohol.

## 2020-11-27 NOTE — Assessment & Plan Note (Signed)
Patient is referred to dermatology.  He has a wart on the right side of scalp please note the patient has a HIV infection

## 2020-11-27 NOTE — Assessment & Plan Note (Signed)
HIV stable at the present time on medication

## 2020-11-27 NOTE — Progress Notes (Signed)
Established Patient Office Visit  Subjective:  Patient ID: Daryl Shelton, male    DOB: Jan 11, 1961  Age: 60 y.o. MRN: [id number]  CC:  Chief Complaint  Patient presents with  . Follow-up    HPI   Past Medical History:  Diagnosis Date  . Anxiety   . Arthritis   . Bell's palsy   . Depression   . HIV (human immunodeficiency virus infection) ([place])   . Hypertension     History reviewed. No pertinent surgical history.  Family History  Problem Relation Age of Onset  . CAD Father     Social History   Socioeconomic History  . Marital status: Married    Spouse name: Not on file  . Number of children: Not on file  . Years of education: Not on file  . Highest education level: Not on file  Occupational History  . Not on file  Tobacco Use  . Smoking status: Current Every Day Smoker    Packs/day: 0.50    Types: Cigarettes  . Smokeless tobacco: Never Used  Vaping Use  . Vaping Use: Never used  Substance and Sexual Activity  . Alcohol use: Not Currently  . Drug use: No  . Sexual activity: Yes  Other Topics Concern  . Not on file  Social History Narrative  . Not on file   Social Determinants of Health   Financial Resource Strain: Not on file  Food Insecurity: Not on file  Transportation Needs: Not on file  Physical Activity: Not on file  Stress: Not on file  Social Connections: Not on file  Intimate Partner Violence: Not on file     Current Outpatient Medications:  .  aspirin EC 81 MG tablet, Take 81 mg by mouth daily., Disp: , Rfl:  .  bictegravir-emtricitabine-tenofovir AF (BIKTARVY) 50-200-25 MG TABS tablet, Take by mouth., Disp: , Rfl:  .  gabapentin (NEURONTIN) 600 MG tablet, Take 600 mg by mouth 3 (three) times daily., Disp: , Rfl:  .  hydrochlorothiazide (HYDRODIURIL) 25 MG tablet, Take 25 mg by mouth daily., Disp: , Rfl:  .  lisinopril (PRINIVIL,ZESTRIL) 40 MG tablet, Take 40 mg by mouth daily., Disp: , Rfl:  .  mirtazapine (REMERON) 30 MG tablet,  Take 30 mg by mouth at bedtime., Disp: , Rfl:  .  omeprazole (PRILOSEC) 20 MG capsule, Take 20 mg by mouth daily., Disp: , Rfl:  .  pravastatin (PRAVACHOL) 40 MG tablet, Take 40 mg by mouth daily., Disp: , Rfl:  .  sertraline (ZOLOFT) 100 MG tablet, Take 200 mg by mouth daily., Disp: , Rfl:    Allergies  Allergen Reactions  . Varenicline Other (See Comments)    CNS symptoms - anxiety, agitation CNS symptoms - anxiety, agitation   . Pollen Extract Other (See Comments)    Congestion     ROS Review of Systems  Constitutional: Negative.  Negative for appetite change.  HENT: Negative.  Negative for ear pain.   Eyes: Negative.   Respiratory: Negative.   Cardiovascular: Negative.   Gastrointestinal: Negative.   Endocrine: Negative.   Genitourinary: Negative.   Musculoskeletal: Positive for arthralgias and back pain. Negative for joint swelling.  Skin: Negative.   Allergic/Immunologic: Negative.   Neurological: Negative.  Negative for headaches.  Hematological: Negative.   Psychiatric/Behavioral: Negative.  Negative for agitation, behavioral problems and confusion.  All other systems reviewed and are negative.     Objective:    Physical Exam Vitals reviewed.  Constitutional:  Appearance: Normal appearance.  HENT:     Mouth/Throat:     Mouth: Mucous membranes are moist.  Eyes:     Pupils: Pupils are equal, round, and reactive to light.  Neck:     Vascular: No carotid bruit.  Cardiovascular:     Rate and Rhythm: Normal rate and regular rhythm.     Pulses: Normal pulses.     Heart sounds: Normal heart sounds.  Pulmonary:     Effort: Pulmonary effort is normal.     Breath sounds: Normal breath sounds.  Abdominal:     General: Bowel sounds are normal.     Palpations: Abdomen is soft. There is no hepatomegaly, splenomegaly or mass.     Tenderness: There is no abdominal tenderness.     Hernia: No hernia is present.  Musculoskeletal:     Cervical back: Neck supple.      Right lower leg: No edema.     Left lower leg: No edema.  Skin:    Findings: No rash.     Comments: Patient has a wart on the right side of the scalp.  He will be referred to dermatology  Neurological:     Mental Status: He is alert and oriented to person, place, and time.     Motor: No weakness.     Comments: Patient has spinal stenosis and he will be referred to neurosurgeon  Psychiatric:        Mood and Affect: Mood normal.        Behavior: Behavior normal.     BP 131/80   Pulse 91   Ht 5\' 10"  (1.778 m)   Wt 238 lb (108 kg)   BMI 34.15 kg/m  Wt Readings from Last 3 Encounters:  11/27/20 238 lb (108 kg)  10/16/20 249 lb 4.8 oz (113.1 kg)  07/01/20 247 lb (112 kg)     Health Maintenance Due  Topic Date Due  . Hepatitis C Screening  Never done  . COVID-19 Vaccine (1) Never done  . COLONOSCOPY (Pts 45-4yrs Insurance coverage will need to be confirmed)  Never done  . TETANUS/TDAP  02/20/2019    There are no preventive care reminders to display for this patient.  No results found for: TSH Lab Results  Component Value Date   WBC 6.7 10/02/2019   HGB 13.9 10/02/2019   HCT 43.0 10/02/2019   MCV 93.1 10/02/2019   PLT 196 10/02/2019   Lab Results  Component Value Date   NA 136 10/02/2019   K 3.9 10/02/2019   CO2 25 10/02/2019   GLUCOSE 110 (H) 10/02/2019   BUN 21 (H) 10/02/2019   CREATININE 1.27 (H) 10/02/2019   BILITOT 0.5 05/04/2016   ALKPHOS 60 05/04/2016   AST 32 05/04/2016   ALT 28 05/04/2016   PROT 8.3 (H) 05/04/2016   ALBUMIN 4.5 05/04/2016   CALCIUM 9.5 10/02/2019   ANIONGAP 11 10/02/2019   Lab Results  Component Value Date   CHOL 215 (H) 05/04/2016   Lab Results  Component Value Date   HDL 72 05/04/2016   Lab Results  Component Value Date   LDLCALC UNABLE TO CALCULATE IF TRIGLYCERIDE OVER 400 mg/dL 05/04/2016   Lab Results  Component Value Date   TRIG 488 (H) 05/04/2016   Lab Results  Component Value Date   CHOLHDL 3.0  05/04/2016   Lab Results  Component Value Date   HGBA1C 5.2 05/04/2016      Assessment & Plan:   Problem List Items  Addressed This Visit      Cardiovascular and Mediastinum   Primary hypertension    Patient blood pressure is normal patient denies any chest pain or shortness of breath there is no history of palpitation paroxysmal nocturnal dyspnea patient can walk  25yards without any problem patient was advised to follow low-salt low-cholesterol diet  I reviewed the results of Sprint trial  ideally I want to keep systolic blood pressure below 130 mmHg, patient was asked to check blood pressure 3 times a week and give me a report on that.  Patient will be follow-up in 3 months, patient will call me back for any change in the cardiovascular symptoms           Musculoskeletal and Integument   Wart of scalp    Patient is referred to dermatology.  He has a wart on the right side of scalp please note the patient has a HIV infection        Other   Currently asymptomatic HIV infection, with history of HIV-related illness ([place])    HIV stable at the present time on medication      History of ETOH abuse    Patient states that he has stopped drinking alcohol.      Spinal stenosis of lumbosacral region    Patient was referred to [place] neurosurgery.  He says that he has stopped drinking and he does not smoke anymore.  He can walk without a walker.  His lab test and MRI has been done in the past in [facility]       Other Visit Diagnoses    Spinal stenosis of lumbar region, unspecified whether neurogenic claudication present    -  Primary   Relevant Orders   Ambulatory referral to Neurosurgery      No orders of the defined types were placed in this encounter.   Follow-up: No follow-ups on file.    Cletis Athens, MD

## 2020-11-27 NOTE — Assessment & Plan Note (Signed)

## 2020-11-27 NOTE — Assessment & Plan Note (Signed)
Patient was referred to [place] neurosurgery.  He says that he has stopped drinking and he does not smoke anymore.  He can walk without a walker.  His lab test and MRI has been done in the past in [facility].

## 2020-11-30 DIAGNOSIS — F4322 Adjustment disorder with anxiety: Secondary | ICD-10-CM | POA: Diagnosis not present

## 2020-12-01 ENCOUNTER — Other Ambulatory Visit: Payer: Self-pay | Admitting: Family Medicine

## 2020-12-07 DIAGNOSIS — F4322 Adjustment disorder with anxiety: Secondary | ICD-10-CM | POA: Diagnosis not present

## 2020-12-14 ENCOUNTER — Other Ambulatory Visit: Payer: Self-pay | Admitting: Family Medicine

## 2020-12-14 DIAGNOSIS — F4322 Adjustment disorder with anxiety: Secondary | ICD-10-CM | POA: Diagnosis not present

## 2020-12-21 DIAGNOSIS — F4322 Adjustment disorder with anxiety: Secondary | ICD-10-CM | POA: Diagnosis not present

## 2020-12-28 DIAGNOSIS — I10 Essential (primary) hypertension: Secondary | ICD-10-CM | POA: Diagnosis not present

## 2020-12-28 DIAGNOSIS — Z6834 Body mass index (BMI) 34.0-34.9, adult: Secondary | ICD-10-CM | POA: Diagnosis not present

## 2020-12-29 ENCOUNTER — Other Ambulatory Visit: Payer: Self-pay | Admitting: Neurological Surgery

## 2020-12-29 ENCOUNTER — Other Ambulatory Visit (HOSPITAL_COMMUNITY): Payer: Self-pay | Admitting: Neurological Surgery

## 2020-12-29 DIAGNOSIS — M4802 Spinal stenosis, cervical region: Secondary | ICD-10-CM

## 2020-12-29 DIAGNOSIS — M48062 Spinal stenosis, lumbar region with neurogenic claudication: Secondary | ICD-10-CM

## 2020-12-30 DIAGNOSIS — F4322 Adjustment disorder with anxiety: Secondary | ICD-10-CM | POA: Diagnosis not present

## 2020-12-31 ENCOUNTER — Ambulatory Visit (INDEPENDENT_AMBULATORY_CARE_PROVIDER_SITE_OTHER): Payer: BC Managed Care – PPO | Admitting: Family Medicine

## 2020-12-31 ENCOUNTER — Other Ambulatory Visit: Payer: Self-pay

## 2020-12-31 ENCOUNTER — Encounter: Payer: Self-pay | Admitting: Family Medicine

## 2020-12-31 VITALS — BP 114/73 | HR 85 | Ht 70.0 in | Wt 237.7 lb

## 2020-12-31 DIAGNOSIS — B351 Tinea unguium: Secondary | ICD-10-CM | POA: Insufficient documentation

## 2020-12-31 DIAGNOSIS — I10 Essential (primary) hypertension: Secondary | ICD-10-CM

## 2020-12-31 DIAGNOSIS — F1011 Alcohol abuse, in remission: Secondary | ICD-10-CM | POA: Diagnosis not present

## 2020-12-31 DIAGNOSIS — B20 Human immunodeficiency virus [HIV] disease: Secondary | ICD-10-CM

## 2020-12-31 DIAGNOSIS — G629 Polyneuropathy, unspecified: Secondary | ICD-10-CM | POA: Diagnosis not present

## 2020-12-31 DIAGNOSIS — N521 Erectile dysfunction due to diseases classified elsewhere: Secondary | ICD-10-CM | POA: Insufficient documentation

## 2020-12-31 LAB — COMPLETE METABOLIC PANEL WITH GFR
AG Ratio: 1.7 (calc) (ref 1.0–2.5)
ALT: 24 U/L (ref 9–46)
AST: 25 U/L (ref 10–35)
Albumin: 4.6 g/dL (ref 3.6–5.1)
Alkaline phosphatase (APISO): 62 U/L (ref 35–144)
BUN: 19 mg/dL (ref 7–25)
CO2: 27 mmol/L (ref 20–32)
Calcium: 9.2 mg/dL (ref 8.6–10.3)
Chloride: 101 mmol/L (ref 98–110)
Creat: 1.17 mg/dL (ref 0.70–1.25)
GFR, Est African American: 78 mL/min/{1.73_m2} (ref >=60)
GFR, Est Non African American: 67 mL/min/{1.73_m2} (ref >=60)
Globulin: 2.7 g/dL (calc) (ref 1.9–3.7)
Glucose, Bld: 97 mg/dL (ref 65–99)
Potassium: 4 mmol/L (ref 3.5–5.3)
Sodium: 137 mmol/L (ref 135–146)
Total Bilirubin: 0.6 mg/dL (ref 0.2–1.2)
Total Protein: 7.3 g/dL (ref 6.1–8.1)

## 2020-12-31 LAB — CBC WITH DIFFERENTIAL/PLATELET
Absolute Monocytes: 418 cells/uL (ref 200–950)
Basophils Absolute: 31 cells/uL (ref 0–200)
Basophils Relative: 0.6 %
Eosinophils Absolute: 168 cells/uL (ref 15–500)
Eosinophils Relative: 3.3 %
HCT: 43.8 % (ref 38.5–50.0)
Hemoglobin: 14 g/dL (ref 13.2–17.1)
Lymphs Abs: 1469 cells/uL (ref 850–3900)
MCH: 28.6 pg (ref 27.0–33.0)
MCHC: 32 g/dL (ref 32.0–36.0)
MCV: 89.4 fL (ref 80.0–100.0)
MPV: 10.2 fL (ref 7.5–12.5)
Monocytes Relative: 8.2 %
Neutro Abs: 3014 cells/uL (ref 1500–7800)
Neutrophils Relative %: 59.1 %
Platelets: 193 10*3/uL (ref 140–400)
RBC: 4.9 10*6/uL (ref 4.20–5.80)
RDW: 12.2 % (ref 11.0–15.0)
Total Lymphocyte: 28.8 %
WBC: 5.1 10*3/uL (ref 3.8–10.8)

## 2020-12-31 MED ORDER — TADALAFIL 10 MG PO TABS: 10.0000 mg | ORAL_TABLET | ORAL | 1 refills | Status: DC | PRN

## 2020-12-31 NOTE — Addendum Note (Signed)
Addended by: Alois Cliche on: 12/31/2020 12:03 PM   Modules accepted: Orders

## 2020-12-31 NOTE — Assessment & Plan Note (Signed)
HTN well controlled continue meds.

## 2020-12-31 NOTE — Progress Notes (Addendum)
Established Patient Office Visit  SUBJECTIVE:  Subjective  Patient ID: Daryl Shelton, male    DOB: 1961/01/02  Age: 60 y.o. MRN: [id number]  CC:  Chief Complaint  Patient presents with  . Follow-up    Patient is here for a 2 month follow up, no complaints today.    HPI Daryl Shelton is a 60 y.o. male presenting today for     Past Medical History:  Diagnosis Date  . Anxiety   . Arthritis   . Bell's palsy   . Depression   . HIV (human immunodeficiency virus infection) ([place])   . Hypertension     History reviewed. No pertinent surgical history.  Family History  Problem Relation Age of Onset  . CAD Father     Social History   Socioeconomic History  . Marital status: Married    Spouse name: Not on file  . Number of children: Not on file  . Years of education: Not on file  . Highest education level: Not on file  Occupational History  . Not on file  Tobacco Use  . Smoking status: Current Every Day Smoker    Packs/day: 0.50    Types: Cigarettes  . Smokeless tobacco: Never Used  Vaping Use  . Vaping Use: Never used  Substance and Sexual Activity  . Alcohol use: Not Currently  . Drug use: No  . Sexual activity: Yes  Other Topics Concern  . Not on file  Social History Narrative  . Not on file   Social Determinants of Health   Financial Resource Strain: Not on file  Food Insecurity: Not on file  Transportation Needs: Not on file  Physical Activity: Not on file  Stress: Not on file  Social Connections: Not on file  Intimate Partner Violence: Not on file     Current Outpatient Medications:  .  aspirin EC 81 MG tablet, Take 81 mg by mouth daily., Disp: , Rfl:  .  BIKTARVY 50-200-25 MG TABS tablet, TAKE 1 TABLET BY MOUTH DAILY, Disp: 90 tablet, Rfl: 1 .  gabapentin (NEURONTIN) 600 MG tablet, TAKE 1 AND 1/2 TABLETS(900 MG) BY MOUTH THREE TIMES DAILY, Disp: 135 tablet, Rfl: 3 .  hydrochlorothiazide (HYDRODIURIL) 25 MG tablet, TAKE 1 TABLET(25 MG) BY  MOUTH DAILY, Disp: 90 tablet, Rfl: 3 .  lisinopril (ZESTRIL) 40 MG tablet, TAKE 1 TABLET(40 MG) BY MOUTH DAILY, Disp: 90 tablet, Rfl: 3 .  mirtazapine (REMERON) 30 MG tablet, Take 30 mg by mouth at bedtime., Disp: , Rfl:  .  omeprazole (PRILOSEC) 20 MG capsule, Take 20 mg by mouth daily., Disp: , Rfl:  .  pravastatin (PRAVACHOL) 40 MG tablet, Take 40 mg by mouth daily., Disp: , Rfl:  .  sertraline (ZOLOFT) 100 MG tablet, Take 200 mg by mouth daily., Disp: , Rfl:    Allergies  Allergen Reactions  . Varenicline Other (See Comments)    CNS symptoms - anxiety, agitation CNS symptoms - anxiety, agitation   . Pollen Extract Other (See Comments)    Congestion     ROS Review of Systems  Constitutional: Negative.   HENT: Negative.   Respiratory: Negative.   Genitourinary: Negative.   Musculoskeletal: Positive for back pain.  Skin: Positive for wound.     OBJECTIVE:    Physical Exam Constitutional:      Appearance: Normal appearance.  HENT:     Head: Normocephalic.     Nose: Nose normal.  Cardiovascular:     Rate and Rhythm:  Normal rate and regular rhythm.  Musculoskeletal:        General: Swelling present.     Cervical back: Normal range of motion.  Skin:    General: Skin is warm.  Neurological:     Mental Status: He is alert.  Psychiatric:        Mood and Affect: Mood normal.     BP 114/73   Pulse 85   Ht 5\' 10"  (1.778 m)   Wt 237 lb 11.2 oz (107.8 kg)   BMI 34.11 kg/m  Wt Readings from Last 3 Encounters:  12/31/20 237 lb 11.2 oz (107.8 kg)  11/27/20 238 lb (108 kg)  10/16/20 249 lb 4.8 oz (113.1 kg)    Health Maintenance Due  Topic Date Due  . COVID-19 Vaccine (1) Never done  . Hepatitis C Screening  Never done  . COLONOSCOPY (Pts 45-97yrs Insurance coverage will need to be confirmed)  Never done  . TETANUS/TDAP  02/20/2019    There are no preventive care reminders to display for this patient.  CBC Latest Ref Rng & Units 10/02/2019 05/05/2016 05/04/2016   WBC 4.0 - 10.5 K/uL 6.7 4.8 6.8  Hemoglobin 13.0 - 17.0 g/dL 13.9 15.3 15.3  Hematocrit 39.0 - 52.0 % 43.0 45.2 44.6  Platelets 150 - 400 K/uL 196 135(L) 148(L)   CMP Latest Ref Rng & Units 10/02/2019 05/05/2016 05/04/2016  Glucose 70 - 99 mg/dL 110(H) 90 100(H)  BUN 6 - 20 mg/dL 21(H) 17 22(H)  Creatinine 0.61 - 1.24 mg/dL 1.27(H) 1.21 1.23  Sodium 135 - 145 mmol/L 136 140 140  Potassium 3.5 - 5.1 mmol/L 3.9 4.4 4.4  Chloride 98 - 111 mmol/L 100 100(L) 102  CO2 22 - 32 mmol/L 25 33(H) 28  Calcium 8.9 - 10.3 mg/dL 9.5 9.7 9.5  Total Protein 6.5 - 8.1 g/dL - - 8.3(H)  Total Bilirubin 0.3 - 1.2 mg/dL - - 0.5  Alkaline Phos 38 - 126 U/L - - 60  AST 15 - 41 U/L - - 32  ALT 17 - 63 U/L - - 28    No results found for: TSH Lab Results  Component Value Date   ALBUMIN 4.5 05/04/2016   ANIONGAP 11 10/02/2019   Lab Results  Component Value Date   CHOL 215 (H) 05/04/2016   HDL 72 05/04/2016   LDLCALC UNABLE TO CALCULATE IF TRIGLYCERIDE OVER 400 mg/dL 05/04/2016   CHOLHDL 3.0 05/04/2016   Lab Results  Component Value Date   TRIG 488 (H) 05/04/2016   Lab Results  Component Value Date   HGBA1C 5.2 05/04/2016      ASSESSMENT & PLAN:   Problem List Items Addressed This Visit      Cardiovascular and Mediastinum   Primary hypertension - Primary    HTN well controlled continue meds.         Nervous and Auditory   Neuropathy    Pain is worse with ambulation, burning and numbness.  Gabapentin helps.   Plan- Ref to Podiatry and will follow up.         Musculoskeletal and Integument   Toenail fungus    Patient has been dealing with thick toenails and thick skin on bottom of feet for years and would like to see Podiatry.         Other   Currently asymptomatic HIV infection, with history of HIV-related illness [facility])    Patient needs ID provider to follow HIV status.       History  of ETOH abuse    Still has not had an ETOH drink in over 3 months.          No orders  of the defined types were placed in this encounter.     Follow-up: No follow-ups on file.    Beckie Salts, [place] [address] [place], [place] [address]

## 2020-12-31 NOTE — Assessment & Plan Note (Signed)
Still has not had an ETOH drink in over 3 months.

## 2020-12-31 NOTE — Assessment & Plan Note (Signed)
Pain is worse with ambulation, burning and numbness.  Gabapentin helps.   Plan- Ref to Podiatry and will follow up.

## 2020-12-31 NOTE — Assessment & Plan Note (Signed)
Patient has been dealing with thick toenails and thick skin on bottom of feet for years and would like to see Podiatry.

## 2020-12-31 NOTE — Assessment & Plan Note (Signed)
Has been dealing with ED for quite a while he says, has never tried medication therapy and would like to.  Plan- Cialis PRN for now to see how he tolerates the medication.

## 2020-12-31 NOTE — Assessment & Plan Note (Signed)
Patient needs ID provider to follow HIV status.

## 2021-01-05 DIAGNOSIS — F4322 Adjustment disorder with anxiety: Secondary | ICD-10-CM | POA: Diagnosis not present

## 2021-01-07 ENCOUNTER — Other Ambulatory Visit: Payer: Self-pay

## 2021-01-07 ENCOUNTER — Ambulatory Visit
Admission: RE | Admit: 2021-01-07 | Discharge: 2021-01-07 | Disposition: A | Discharge status: Home / Self Care | Payer: BC Managed Care – PPO | Source: Ambulatory Visit | Attending: Neurological Surgery | Admitting: Neurological Surgery

## 2021-01-07 ENCOUNTER — Ambulatory Visit: Payer: BC Managed Care – PPO

## 2021-01-07 DIAGNOSIS — M48062 Spinal stenosis, lumbar region with neurogenic claudication: Secondary | ICD-10-CM | POA: Diagnosis not present

## 2021-01-07 DIAGNOSIS — M545 Low back pain, unspecified: Secondary | ICD-10-CM | POA: Diagnosis not present

## 2021-01-07 DIAGNOSIS — M50223 Other cervical disc displacement at C6-C7 level: Secondary | ICD-10-CM | POA: Diagnosis not present

## 2021-01-07 DIAGNOSIS — M4802 Spinal stenosis, cervical region: Secondary | ICD-10-CM | POA: Insufficient documentation

## 2021-01-07 DIAGNOSIS — M4803 Spinal stenosis, cervicothoracic region: Secondary | ICD-10-CM | POA: Diagnosis not present

## 2021-01-07 DIAGNOSIS — M50221 Other cervical disc displacement at C4-C5 level: Secondary | ICD-10-CM | POA: Diagnosis not present

## 2021-01-13 MED ORDER — SERTRALINE 100 MG TABLET
ORAL_TABLET | Freq: Every day | ORAL | 5 refills | 60 days | Status: CP
Start: 2021-01-13 — End: ?

## 2021-01-13 NOTE — Unmapped (Signed)
Patient is requesting the following refill  Requested Prescriptions     Pending Prescriptions Disp Refills   ??? sertraline (ZOLOFT) 100 MG tablet [Pharmacy Med Name: SERTRALINE 100MG  TABLETS] 60 tablet 5     Sig: TAKE 2 TABLETS(200 MG) BY MOUTH DAILY       Recent Visits  Date Type Provider Dept   06/11/20 Office Visit Michaele Offer, FNP [facility] [address] [facility]   05/11/20 Office Visit Jenell Milliner, MD [facility]   Showing recent visits within past 365 days with a meds authorizing provider and meeting all other requirements  Future Appointments  No visits were found meeting these conditions.  Showing future appointments within next 365 days with a meds authorizing provider and meeting all other requirements       Labs:   PHQ9:   PHQ-9 PHQ-9 TOTAL SCORE   06/11/2020 10    and GAD7:   GAD7 Total Score GAD-7 Total Score   06/11/2020 6

## 2021-01-13 NOTE — Unmapped (Signed)
Rx refill done- pt needs to schedule an OV.

## 2021-01-14 ENCOUNTER — Encounter: Payer: Self-pay | Admitting: Family Medicine

## 2021-01-15 DIAGNOSIS — F4322 Adjustment disorder with anxiety: Secondary | ICD-10-CM | POA: Diagnosis not present

## 2021-01-18 ENCOUNTER — Ambulatory Visit: Payer: Self-pay | Admitting: Podiatry

## 2021-01-20 DIAGNOSIS — M48062 Spinal stenosis, lumbar region with neurogenic claudication: Secondary | ICD-10-CM | POA: Diagnosis not present

## 2021-01-21 DIAGNOSIS — F4322 Adjustment disorder with anxiety: Secondary | ICD-10-CM | POA: Diagnosis not present

## 2021-01-26 ENCOUNTER — Other Ambulatory Visit: Payer: Self-pay | Admitting: Neurological Surgery

## 2021-01-27 ENCOUNTER — Encounter: Payer: Self-pay | Admitting: Podiatry

## 2021-01-27 ENCOUNTER — Other Ambulatory Visit: Payer: Self-pay | Admitting: Neurological Surgery

## 2021-01-27 ENCOUNTER — Ambulatory Visit (INDEPENDENT_AMBULATORY_CARE_PROVIDER_SITE_OTHER): Payer: BC Managed Care – PPO | Admitting: Podiatry

## 2021-01-27 ENCOUNTER — Other Ambulatory Visit: Payer: Self-pay

## 2021-01-27 DIAGNOSIS — B353 Tinea pedis: Secondary | ICD-10-CM

## 2021-01-27 DIAGNOSIS — B351 Tinea unguium: Secondary | ICD-10-CM

## 2021-01-27 MED ORDER — KETOCONAZOLE 2 % EX CREA: 1.0000 "application " | TOPICAL_CREAM | Freq: Every day | CUTANEOUS | 2 refills | Status: DC

## 2021-01-27 MED ORDER — TERBINAFINE HCL 250 MG PO TABS: 250.0000 mg | ORAL_TABLET | Freq: Every day | ORAL | 0 refills | Status: AC

## 2021-01-27 NOTE — Progress Notes (Signed)
  Subjective:  Patient ID: LINWOOD GULLIKSON, male    DOB: May 21, 1961,  MRN: [id number]  Chief Complaint  Patient presents with   Callouses   Nail Problem    Patient presents today for dry cracked painful heels and nail fungus bilat feet    60 y.o. male presents with the above complaint. History confirmed with patient.   Objective:  Physical Exam: warm, good capillary refill, no trophic changes or ulcerative lesions, normal DP and PT pulses, and normal sensory exam.  Onychomycosis with thickened elongated nail plates with yellow discoloration subungual debris bilateral.  He also has dry cracking skin that is itchy on the heels.      Assessment:   1. Onychomycosis   2. Tinea pedis of both feet      Plan:  Patient was evaluated and treated and all questions answered.  Discussed the etiology and treatment options for tinea pedis and onychomycosis.  Discussed topical and oral treatment.  Recommended topical treatment with 2% ketoconazole cream.  Also prescribed Lamisil 90-day course for both the nails and the tinea.  Last LFTs 12/31/2020 normal limits.  This was sent to the patient's pharmacy.  Also discussed appropriate foot hygiene, use of antifungal spray such as Tinactin in shoes, as well as cleaning foot surfaces such as showers and bathroom floors with bleach.   Return in about 4 months (around 05/29/2021).

## 2021-01-28 ENCOUNTER — Other Ambulatory Visit
Admission: RE | Admit: 2021-01-28 | Discharge: 2021-01-28 | Disposition: A | Discharge status: Home / Self Care | Payer: BC Managed Care – PPO | Attending: Infectious Diseases | Admitting: Infectious Diseases

## 2021-01-28 ENCOUNTER — Ambulatory Visit: Payer: BC Managed Care – PPO | Attending: Infectious Diseases | Admitting: Infectious Diseases

## 2021-01-28 VITALS — BP 121/81 | HR 74 | Temp 97.7°F | Resp 16 | Ht 70.0 in | Wt 242.0 lb

## 2021-01-28 DIAGNOSIS — G629 Polyneuropathy, unspecified: Secondary | ICD-10-CM | POA: Insufficient documentation

## 2021-01-28 DIAGNOSIS — F419 Anxiety disorder, unspecified: Secondary | ICD-10-CM | POA: Diagnosis not present

## 2021-01-28 DIAGNOSIS — I10 Essential (primary) hypertension: Secondary | ICD-10-CM | POA: Diagnosis not present

## 2021-01-28 DIAGNOSIS — Z7901 Long term (current) use of anticoagulants: Secondary | ICD-10-CM | POA: Insufficient documentation

## 2021-01-28 DIAGNOSIS — E785 Hyperlipidemia, unspecified: Secondary | ICD-10-CM | POA: Insufficient documentation

## 2021-01-28 DIAGNOSIS — F32A Depression, unspecified: Secondary | ICD-10-CM | POA: Diagnosis not present

## 2021-01-28 DIAGNOSIS — B20 Human immunodeficiency virus [HIV] disease: Secondary | ICD-10-CM | POA: Insufficient documentation

## 2021-01-28 DIAGNOSIS — F17210 Nicotine dependence, cigarettes, uncomplicated: Secondary | ICD-10-CM | POA: Insufficient documentation

## 2021-01-28 DIAGNOSIS — Z79899 Other long term (current) drug therapy: Secondary | ICD-10-CM | POA: Diagnosis not present

## 2021-01-28 LAB — HEPATITIS C ANTIBODY: HCV Ab: NONREACTIVE

## 2021-01-28 NOTE — Patient Instructions (Signed)
You are here to establish care for your virus. You are on Biktarvy, today we will do labs-follow up 1 month

## 2021-01-28 NOTE — Progress Notes (Signed)
NAME: Daryl Shelton  DOB: 08-Apr-1961  MRN: [id number]  Date/Time: 01/28/2021 10:31 AM  ? Daryl Shelton is a 60 y.o. 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 Pt was  Was at [facility], until 2021 On Biktarvy - 100% adherent last cd4 on 02/04/20 was 588(49%) and Vl < 20  Nadir Cd4 unknown-as far as I can see [facility] records- 08/19/10- 629 OI -none  HAARt history Genvoya Dont know the medicine he took before Acquired thru sex with men  [facility] Pharmacy [facility] ? 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    Family History  Problem Relation Age of Onset   CAD Father   Twin brother -HIV 5 sisters- 3 of 5 opioid dependence due to back pain   Allergies  Allergen Reactions   Varenicline Other (See Comments)    CNS symptoms - anxiety, agitation CNS symptoms - anxiety, agitation    Pollen Extract Other (See Comments)    Congestion    ? Current Outpatient Medications  Medication Sig Dispense Refill   atorvastatin (LIPITOR) 20 MG tablet Take by mouth.     BIKTARVY 50-200-25 MG TABS tablet TAKE 1 TABLET BY MOUTH DAILY 90 tablet 1   chlorhexidine (PERIDEX) 0.12 % solution 15 mLs 2 (two) times daily.     gabapentin (NEURONTIN) 600 MG tablet Take 1 tablet by mouth 3 (three) times daily.     hydrochlorothiazide (HYDRODIURIL) 25 MG tablet TAKE 1  TABLET(25 MG) BY MOUTH DAILY 90 tablet 3   ketoconazole (NIZORAL) 2 % cream Apply 1 application topically daily. 60 g 2   lisinopril (ZESTRIL) 40 MG tablet Take by mouth.     mirtazapine (REMERON) 30 MG tablet Take by mouth.     nicotine (NICODERM CQ - DOSED IN MG/24 HOURS) 21 mg/24hr patch Place onto the skin.     pravastatin (PRAVACHOL) 40 MG tablet Take 40 mg by mouth daily.     sertraline (ZOLOFT) 100 MG tablet Take 200 mg by mouth daily.     tadalafil (CIALIS) 10 MG tablet Take 1 tablet (10 mg total) by mouth every other day as needed for erectile dysfunction. 10 tablet 1   terbinafine (LAMISIL) 250 MG tablet Take 1 tablet (250 mg total) by mouth daily. 90 tablet 0   Naprosyn MVT   REVIEW OF SYSTEMS:  Const: negative fever, negative chills, some weight loss Eyes: negative diplopia or visual changes, negative eye pain ENT: negative coryza, negative sore throat Resp: negative cough, hemoptysis, dyspnea Cards: negative for chest pain, palpitations, lower extremity edema GU: negative for frequency, dysuria and hematuria Skin: negative for rash and pruritus Heme: negative for easy bruising and gum/nose bleeding MS: negative for myalgias, arthralgias, back pain and muscle weakness Neurolo:numbness feet Psych:  anxiety  Objective:  VITALS:  BP  121/81   Pulse 74   Temp 97.7 F (36.5 C)   Resp 16   Ht 5\' 10"  (1.778 m)   Wt 242 lb (109.8 kg)   SpO2 98%   BMI 34.72 kg/m  PHYSICAL EXAM:  General: Alert, cooperative, no distress, appears stated age.  Head: Normocephalic, without obvious abnormality, atraumatic. Eyes: Conjunctivae clear, anicteric sclerae. Pupils are equal Nose: Nares normal. No drainage or sinus tenderness. Throat: Lips, mucosa, and tongue normal. No Thrush Neck: Supple, symmetrical, no adenopathy, thyroid: non tender no carotid bruit and no JVD. Back: No CVA tenderness. Lungs: Clear to auscultation bilaterally. No Wheezing or Rhonchi. No rales. Heart:  Regular rate and rhythm, no murmur, rub or gallop. Abdomen: Soft, non-tender,not distended. Bowel sounds normal. No masses Extremities: Extremities normal, atraumatic, no cyanosis. No edema. No clubbing Skin: No rashes or lesions. Not Jaundiced Lymph: Cervical, supraclavicular normal. Neurologic:numbness feet,  Health maintenance Vaccination Immunizations   Immunizations Name Administration Dates Next Due  COVID-19 VACCINE,MRNA(MODERNA)(PF)(IM) 11/27/2019, 10/28/2019    Hepatitis A 12/29/2010, 04/21/2010    Hepatitis B, Adult 05/10/2017, 04/21/2010, 11/04/2009, 07/08/2009    INFLUENZA INJ MDCK PF, QUAD,(FLUCELVAX)(70MO AND UP EGG FREE) 05/11/2020    INFLUENZA TIV (TRI) PF (IM) 06/23/2011, 04/21/2010, 07/08/2009    Influenza Vaccine Quad (IIV4 PF) 53mo+ injectable 05/02/2019, 05/07/2018, 05/10/2017, 05/05/2016, 10/01/2015, 08/28/2014, 05/02/2013, 06/28/2012    PNEUMOCOCCAL POLYSACCHARIDE 23 02/26/2015, 02/19/2009    PPD Test 12/29/2010, 11/04/2009    Pneumococcal Conjugate 13-Valent 03/27/2014    SHINGRIX-ZOSTER VACCINE (HZV), RECOMBINANT,SUB-UNIT,ADJUVANTED IM 02/04/2020, 08/06/2019    TdaP 02/19/2009    Tetanus-Diptheria Toxoids-TD(TDVAX),Asdorbed,2LF(IM) 08/06/2019      ______________________  Labs Lab Result  Date comment  HIV VL     CD4     Genotype     [id number]     HIV antibody     RPR     Quantiferon Gold     Hep C ab     Hepatitis B-ab,ag,c     Hepatitis A-IgM, IgG /T     Lipid     GC/CHL     PAP     HB,PLT,Cr, LFT       Preventive  Procedure Result  Date comment  colonoscopy     Mammogram     Dental exam     Opthal       Impression/Recommendation ? ?HIV- transferring his care to me- on Biktarvy- well controlled- Vl < 20 and cd4 is 588 from June 2021-  will get labs today  HTN on lisinopril and HCTZ  Hyperlipidemia on pravachol  Anxiety/depression- zoloft, remeron  Spinal steosis- having surgery in July  Peripheral neuropathy Current  smoker  Seborrheic warts scalp/trunk  Will see him in 1 month for STD screening, anal pap health maintenance update ? ___________________________________________________ Discussed with patient in detail. Spent 60 min on this visit

## 2021-01-29 ENCOUNTER — Other Ambulatory Visit: Payer: Self-pay

## 2021-01-29 DIAGNOSIS — F4322 Adjustment disorder with anxiety: Secondary | ICD-10-CM | POA: Diagnosis not present

## 2021-01-29 LAB — T-HELPER CELLS CD4/CD8 %
% CD 4 Pos. Lymph.: 54.4 % (ref 30.8–58.5)
Absolute CD 4 Helper: 925 /uL (ref 359–1519)
Basophils Absolute: 0 10*3/uL (ref 0.0–0.2)
Basos: 1 %
CD3+CD4+ Cells/CD3+CD8+ Cells Bld: 2.96 (ref 0.92–3.72)
CD3+CD8+ Cells # Bld: 313 /uL (ref 109–897)
CD3+CD8+ Cells NFr Bld: 18.4 % (ref 12.0–35.5)
EOS (ABSOLUTE): 0.1 10*3/uL (ref 0.0–0.4)
Eos: 2 %
Hematocrit: 42.4 % (ref 37.5–51.0)
Hemoglobin: 14.2 g/dL (ref 13.0–17.7)
Immature Grans (Abs): 0.1 10*3/uL (ref 0.0–0.1)
Immature Granulocytes: 1 %
Lymphocytes Absolute: 1.7 10*3/uL (ref 0.7–3.1)
Lymphs: 30 %
MCH: 29 pg (ref 26.6–33.0)
MCHC: 33.5 g/dL (ref 31.5–35.7)
MCV: 87 fL (ref 79–97)
Monocytes Absolute: 0.4 10*3/uL (ref 0.1–0.9)
Monocytes: 7 %
Neutrophils Absolute: 3.5 10*3/uL (ref 1.4–7.0)
Neutrophils: 59 %
Platelets: 204 10*3/uL (ref 150–450)
RBC: 4.89 x10E6/uL (ref 4.14–5.80)
RDW: 12.7 % (ref 11.6–15.4)
WBC: 5.8 10*3/uL (ref 3.4–10.8)

## 2021-01-29 LAB — HEPATITIS B SURFACE ANTIBODY, QUANTITATIVE: Hep B S AB Quant (Post): 124.1 m[IU]/mL (ref >9.9)

## 2021-01-29 LAB — RPR: RPR Ser Ql: NONREACTIVE

## 2021-01-29 LAB — HIV-1 RNA QUANT-NO REFLEX-BLD
HIV 1 RNA Quant: <20 copies/mL
LOG10 HIV-1 RNA: UNDETERMINED log10copy/mL

## 2021-02-01 LAB — QUANTIFERON-TB GOLD PLUS (RQFGPL)
QuantiFERON Mitogen Value: >10 IU/mL
QuantiFERON Nil Value: 0.25 IU/mL
QuantiFERON TB1 Ag Value: 0.22 IU/mL
QuantiFERON TB2 Ag Value: 0.26 IU/mL

## 2021-02-01 LAB — QUANTIFERON-TB GOLD PLUS: QuantiFERON-TB Gold Plus: NEGATIVE

## 2021-02-09 DIAGNOSIS — F4322 Adjustment disorder with anxiety: Secondary | ICD-10-CM | POA: Diagnosis not present

## 2021-02-10 ENCOUNTER — Encounter (HOSPITAL_COMMUNITY): Payer: Self-pay

## 2021-02-10 ENCOUNTER — Other Ambulatory Visit: Payer: Self-pay

## 2021-02-10 ENCOUNTER — Encounter (HOSPITAL_COMMUNITY)
Admission: RE | Admit: 2021-02-10 | Discharge: 2021-02-10 | Disposition: A | Discharge status: Home / Self Care | Payer: BC Managed Care – PPO | Source: Ambulatory Visit | Attending: Neurological Surgery | Admitting: Neurological Surgery

## 2021-02-10 DIAGNOSIS — Z01818 Encounter for other preprocedural examination: Secondary | ICD-10-CM | POA: Diagnosis not present

## 2021-02-10 LAB — CBC
HCT: 44.7 % (ref 39.0–52.0)
Hemoglobin: 13.9 g/dL (ref 13.0–17.0)
MCH: 28.7 pg (ref 26.0–34.0)
MCHC: 31.1 g/dL (ref 30.0–36.0)
MCV: 92.2 fL (ref 80.0–100.0)
Platelets: 181 10*3/uL (ref 150–400)
RBC: 4.85 MIL/uL (ref 4.22–5.81)
RDW: 13.4 % (ref 11.5–15.5)
WBC: 5.7 10*3/uL (ref 4.0–10.5)
nRBC: 0 % (ref 0.0–0.2)

## 2021-02-10 LAB — BASIC METABOLIC PANEL
Anion gap: 8 (ref 5–15)
BUN: 25 mg/dL — ABNORMAL HIGH (ref 6–20)
CO2: 27 mmol/L (ref 22–32)
Calcium: 9.7 mg/dL (ref 8.9–10.3)
Chloride: 101 mmol/L (ref 98–111)
Creatinine, Ser: 1.26 mg/dL — ABNORMAL HIGH (ref 0.61–1.24)
GFR, Estimated: >60 mL/min (ref >60)
Glucose, Bld: 98 mg/dL (ref 70–99)
Potassium: 4.9 mmol/L (ref 3.5–5.1)
Sodium: 136 mmol/L (ref 135–145)

## 2021-02-10 LAB — SURGICAL PCR SCREEN
MRSA, PCR: NEGATIVE
Staphylococcus aureus: NEGATIVE

## 2021-02-10 NOTE — Progress Notes (Addendum)
Surgical Instructions    Your procedure is scheduled on Tuesday July 12.  Report to [facility] Main Entrance "A" at 5:30 A.M., then check in with the Admitting office.  Call this number if you have problems the morning of surgery:  [phone]   If you have any questions prior to your surgery date call [phone]: Open Monday-Friday 8am-4pm    Remember:  Do not eat or drink anything after midnight the night before your surgery     Take these medicines the morning of surgery with A SIP OF WATER:               atorvastatin (LIPITOR)              BIKTARVY              gabapentin (NEURONTIN)               sertraline (ZOLOFT)              terbinafine (LAMISIL)  As of today, STOP taking any Aspirin (unless otherwise instructed by your surgeon) Aleve, Naproxen, Ibuprofen, Motrin, Advil, Goody's, BC's, all herbal medications, fish oil, and all vitamins.          Do not wear jewelry or makeup Do not wear lotions, powders, perfumes/colognes, or deodorant. Do not shave 48 hours prior to surgery.  Men may shave face and neck. Do not bring valuables to the hospital. DO Not wear nail polish, gel polish, artificial nails, or any other type of covering on  natural nails including finger and toenails. If patients have artificial nails, gel coating, etc. that need to be removed by a nail salon please have this removed prior to surgery or surgery may need to be canceled/delayed if the surgeon/ anesthesia feels like the patient is unable to be adequately monitored.             [facility] is not responsible for any belongings or valuables.  Do NOT Smoke (Tobacco/Vaping) or drink Alcohol 24 hours prior to your procedure If you use a CPAP at night, you may bring all equipment for your overnight stay.   Contacts, glasses, dentures or bridgework may not be worn into surgery, please bring cases for these belongings   For patients admitted to the hospital, discharge time will be determined by your  treatment team.   Patients discharged the day of surgery will not be allowed to drive home, and someone needs to stay with them for 24 hours.  ONLY 1 SUPPORT PERSON MAY BE PRESENT WHILE YOU ARE IN SURGERY. IF YOU ARE TO BE ADMITTED ONCE YOU ARE IN YOUR ROOM YOU WILL BE ALLOWED TWO (2) VISITORS.  Minor children may have two parents present. Special consideration for safety and communication needs will be reviewed on a case by case basis.  Special instructions:    Oral Hygiene is also important to reduce your risk of infection.  Remember - BRUSH YOUR TEETH THE MORNING OF SURGERY WITH YOUR REGULAR TOOTHPASTE   [facility]- Preparing For Surgery  Before surgery, you can play an important role. Because skin is not sterile, your skin needs to be as free of germs as possible. You can reduce the number of germs on your skin by washing with CHG (chlorahexidine gluconate) Soap before surgery.  CHG is an antiseptic cleaner which kills germs and bonds with the skin to continue killing germs even after washing.     Please do not use if you have an allergy  to CHG or antibacterial soaps. If your skin becomes reddened/irritated stop using the CHG.  Do not shave (including legs and underarms) for at least 48 hours prior to first CHG shower. It is OK to shave your face.  Please follow these instructions carefully.     Shower the NIGHT BEFORE SURGERY and the MORNING OF SURGERY with CHG Soap.   If you chose to wash your hair, wash your hair first as usual with your normal shampoo. After you shampoo, rinse your hair and body thoroughly to remove the shampoo.  Then [employer] and genitals (private parts) with your normal soap and rinse thoroughly to remove soap.  After that Use CHG Soap as you would any other liquid soap. You can apply CHG directly to the skin and wash gently with a scrungie or a clean washcloth.   Apply the CHG Soap to your body ONLY FROM THE NECK DOWN.  Do not use on open wounds or open sores.  Avoid contact with your eyes, ears, mouth and genitals (private parts). Wash Face and genitals (private parts)  with your normal soap.   Wash thoroughly, paying special attention to the area where your surgery will be performed.  Thoroughly rinse your body with warm water from the neck down.  DO NOT shower/wash with your normal soap after using and rinsing off the CHG Soap.  Pat yourself dry with a CLEAN TOWEL.  Wear CLEAN PAJAMAS to bed the night before surgery  Place CLEAN SHEETS on your bed the night before your surgery  DO NOT SLEEP WITH PETS.   Day of Surgery:  Take a shower with CHG soap. Wear Clean/Comfortable clothing the morning of surgery Do not apply any deodorants/lotions.   Remember to brush your teeth WITH YOUR REGULAR TOOTHPASTE.   Please read over the following fact sheets that you were given.

## 2021-02-10 NOTE — Progress Notes (Signed)
PCP - Beckie Salts FNP Cardiologist - Flossie Dibble MD  PPM/ICD - none Device Orders -  Rep Notified -   Chest x-ray - 10/02/19 EKG - 02/11/20 Stress Test - 11/11/19 ECHO - 11/11/19 Cardiac Cath - none  Sleep Study - no CPAP - no  Fasting Blood Sugar - n/a Checks Blood Sugar _____ times a day  Blood Thinner Instructions:n/a Aspirin Instructions:n/a  ERAS Protcol -n/a PRE-SURGERY Ensure or G2-   COVID TEST- scheduled for Friday 7//8 at [facility] at 12:30   Anesthesia review: no  Patient denies shortness of breath, fever, cough and chest pain at PAT appointment   All instructions explained to the patient, with a verbal understanding of the material. Patient agrees to go over the instructions while at home for a better understanding. Patient also instructed to self quarantine after being tested for COVID-19. The opportunity to ask questions was provided.

## 2021-02-11 DIAGNOSIS — Z720 Tobacco use: Principal | ICD-10-CM

## 2021-02-11 MED ORDER — NICOTINE 21 MG/24 HR DAILY TRANSDERMAL PATCH
MEDICATED_PATCH | 0 refills | 0 days
Start: 2021-02-11 — End: ?

## 2021-02-11 NOTE — Unmapped (Signed)
Refill request for Nicotine patch  Last OV was 06-11-20   No upcoming appt.

## 2021-02-11 NOTE — Unmapped (Signed)
LM to schedule appt

## 2021-02-11 NOTE — Unmapped (Signed)
Needs OV. Notify.

## 2021-02-12 ENCOUNTER — Other Ambulatory Visit: Payer: Self-pay

## 2021-02-12 ENCOUNTER — Other Ambulatory Visit
Admission: RE | Admit: 2021-02-12 | Discharge: 2021-02-12 | Disposition: A | Discharge status: Home / Self Care | Payer: BC Managed Care – PPO | Source: Ambulatory Visit | Attending: Neurological Surgery | Admitting: Neurological Surgery

## 2021-02-12 DIAGNOSIS — Z20822 Contact with and (suspected) exposure to covid-19: Secondary | ICD-10-CM | POA: Insufficient documentation

## 2021-02-12 DIAGNOSIS — Z01812 Encounter for preprocedural laboratory examination: Secondary | ICD-10-CM | POA: Diagnosis not present

## 2021-02-13 LAB — SARS CORONAVIRUS 2 (TAT 6-24 HRS): SARS Coronavirus 2: NEGATIVE

## 2021-02-15 NOTE — Anesthesia Preprocedure Evaluation (Addendum)
Anesthesia Evaluation  Patient identified by MRN, date of birth, ID band Patient awake    Reviewed: Allergy & Precautions, H&P , NPO status , Patient's Chart, lab work & pertinent test results  Airway Mallampati: I  TM Distance: >3 FB Neck ROM: Full    Dental no notable dental hx. (+) Teeth Intact, Dental Advisory Given   Pulmonary Current SmokerPatient did not abstain from smoking.,    Pulmonary exam normal breath sounds clear to auscultation       Cardiovascular Exercise Tolerance: Good hypertension, Pt. on medications  Rhythm:Regular Rate:Normal     Neuro/Psych Anxiety Depression negative neurological ROS     GI/Hepatic Neg liver ROS, GERD  ,  Endo/Other  negative endocrine ROS  Renal/GU negative Renal ROS  negative genitourinary   Musculoskeletal  (+) Arthritis , Osteoarthritis,    Abdominal   Peds  Hematology negative hematology ROS (+)   Anesthesia Other Findings   Reproductive/Obstetrics negative OB ROS                            Anesthesia Physical Anesthesia Plan  ASA: 2  Anesthesia Plan: General   Post-op Pain Management:    Induction: Intravenous  PONV Risk Score and Plan: 2 and Ondansetron, Dexamethasone and Midazolam  Airway Management Planned: Oral ETT  Additional Equipment:   Intra-op Plan:   Post-operative Plan: Extubation in OR  Informed Consent: I have reviewed the patients History and Physical, chart, labs and discussed the procedure including the risks, benefits and alternatives for the proposed anesthesia with the patient or authorized representative who has indicated his/her understanding and acceptance.     Dental advisory given  Plan Discussed with: CRNA  Anesthesia Plan Comments:        Anesthesia Quick Evaluation

## 2021-02-16 ENCOUNTER — Other Ambulatory Visit: Payer: Self-pay

## 2021-02-16 ENCOUNTER — Encounter (HOSPITAL_COMMUNITY)
Admission: RE | Disposition: A | Discharge status: Home / Self Care | Payer: Self-pay | Source: Home / Self Care | Attending: Neurological Surgery

## 2021-02-16 ENCOUNTER — Ambulatory Visit (HOSPITAL_COMMUNITY): Payer: BC Managed Care – PPO

## 2021-02-16 ENCOUNTER — Ambulatory Visit (HOSPITAL_COMMUNITY): Payer: BC Managed Care – PPO | Admitting: Anesthesiology

## 2021-02-16 ENCOUNTER — Encounter (HOSPITAL_COMMUNITY): Payer: Self-pay | Admitting: Neurological Surgery

## 2021-02-16 ENCOUNTER — Observation Stay (HOSPITAL_COMMUNITY)
Admission: RE | Admit: 2021-02-16 | Discharge: 2021-02-18 | Disposition: A | Discharge status: Home / Self Care | Payer: BC Managed Care – PPO | Attending: Neurological Surgery | Admitting: Neurological Surgery

## 2021-02-16 DIAGNOSIS — E782 Mixed hyperlipidemia: Secondary | ICD-10-CM | POA: Diagnosis not present

## 2021-02-16 DIAGNOSIS — F418 Other specified anxiety disorders: Secondary | ICD-10-CM | POA: Diagnosis not present

## 2021-02-16 DIAGNOSIS — I10 Essential (primary) hypertension: Secondary | ICD-10-CM | POA: Diagnosis not present

## 2021-02-16 DIAGNOSIS — Z79899 Other long term (current) drug therapy: Secondary | ICD-10-CM | POA: Insufficient documentation

## 2021-02-16 DIAGNOSIS — M48062 Spinal stenosis, lumbar region with neurogenic claudication: Secondary | ICD-10-CM | POA: Diagnosis not present

## 2021-02-16 DIAGNOSIS — F17210 Nicotine dependence, cigarettes, uncomplicated: Secondary | ICD-10-CM | POA: Insufficient documentation

## 2021-02-16 DIAGNOSIS — M4807 Spinal stenosis, lumbosacral region: Secondary | ICD-10-CM | POA: Diagnosis not present

## 2021-02-16 DIAGNOSIS — Z981 Arthrodesis status: Secondary | ICD-10-CM | POA: Diagnosis not present

## 2021-02-16 DIAGNOSIS — K219 Gastro-esophageal reflux disease without esophagitis: Secondary | ICD-10-CM | POA: Diagnosis not present

## 2021-02-16 DIAGNOSIS — Z419 Encounter for procedure for purposes other than remedying health state, unspecified: Secondary | ICD-10-CM

## 2021-02-16 HISTORY — PX: LUMBAR LAMINECTOMY/DECOMPRESSION MICRODISCECTOMY: SHX5026

## 2021-02-16 SURGERY — LUMBAR LAMINECTOMY/DECOMPRESSION MICRODISCECTOMY 4 LEVEL
Anesthesia: General

## 2021-02-16 MED ORDER — HYDROCHLOROTHIAZIDE 25 MG PO TABS
25.0000 mg | ORAL_TABLET | Freq: Every day | ORAL | Status: DC
  Administered 2021-02-17: 25 mg via ORAL
  Filled 2021-02-16 (×3): qty 1

## 2021-02-16 MED ORDER — FENTANYL CITRATE (PF) 250 MCG/5ML IJ SOLN
INTRAMUSCULAR | Status: AC
  Filled 2021-02-16: qty 5

## 2021-02-16 MED ORDER — MICROFIBRILLAR COLL HEMOSTAT EX POWD
CUTANEOUS | Status: AC
  Filled 2021-02-16: qty 5

## 2021-02-16 MED ORDER — LIDOCAINE 2% (20 MG/ML) 5 ML SYRINGE
INTRAMUSCULAR | Status: DC | PRN
  Administered 2021-02-16: 60 mg via INTRAVENOUS

## 2021-02-16 MED ORDER — ALBUMIN HUMAN 5 % IV SOLN: INTRAVENOUS | Status: DC | PRN

## 2021-02-16 MED ORDER — SODIUM CHLORIDE 0.9 % IV SOLN: INTRAVENOUS | Status: DC

## 2021-02-16 MED ORDER — ONDANSETRON HCL 4 MG/2ML IJ SOLN
INTRAMUSCULAR | Status: AC
  Filled 2021-02-16: qty 2

## 2021-02-16 MED ORDER — BUPIVACAINE LIPOSOME 1.3 % IJ SUSP
INTRAMUSCULAR | Status: AC
  Filled 2021-02-16: qty 20

## 2021-02-16 MED ORDER — ALBUTEROL SULFATE HFA 108 (90 BASE) MCG/ACT IN AERS
INHALATION_SPRAY | RESPIRATORY_TRACT | Status: DC | PRN
  Administered 2021-02-16: 8 via RESPIRATORY_TRACT

## 2021-02-16 MED ORDER — VASOPRESSIN 20 UNIT/ML IV SOLN
INTRAVENOUS | Status: DC | PRN
  Administered 2021-02-16: 1 [IU] via INTRAVENOUS
  Administered 2021-02-16: 2 [IU] via INTRAVENOUS
  Administered 2021-02-16 (×4): 1 [IU] via INTRAVENOUS
  Administered 2021-02-16: .5 [IU] via INTRAVENOUS
  Administered 2021-02-16: 2 [IU] via INTRAVENOUS
  Administered 2021-02-16 (×2): 1 [IU] via INTRAVENOUS
  Administered 2021-02-16: .5 [IU] via INTRAVENOUS

## 2021-02-16 MED ORDER — MIDAZOLAM HCL 2 MG/2ML IJ SOLN
INTRAMUSCULAR | Status: AC
  Filled 2021-02-16: qty 2

## 2021-02-16 MED ORDER — CEFAZOLIN SODIUM-DEXTROSE 2-4 GM/100ML-% IV SOLN
2.0000 g | INTRAVENOUS | Status: AC
  Administered 2021-02-16: 2 g via INTRAVENOUS
  Filled 2021-02-16: qty 100

## 2021-02-16 MED ORDER — PROPOFOL 10 MG/ML IV BOLUS
INTRAVENOUS | Status: DC | PRN
  Administered 2021-02-16: 150 mg via INTRAVENOUS

## 2021-02-16 MED ORDER — CHLORHEXIDINE GLUCONATE 0.12 % MT SOLN
15.0000 mL | Freq: Once | OROMUCOSAL | Status: AC
  Administered 2021-02-16: 15 mL via OROMUCOSAL
  Filled 2021-02-16: qty 15

## 2021-02-16 MED ORDER — THROMBIN 5000 UNITS EX SOLR
OROMUCOSAL | Status: DC | PRN
  Administered 2021-02-16 (×2): 5 mL via TOPICAL

## 2021-02-16 MED ORDER — MENTHOL 3 MG MT LOZG: 1.0000 | LOZENGE | OROMUCOSAL | Status: DC | PRN

## 2021-02-16 MED ORDER — BUPIVACAINE LIPOSOME 1.3 % IJ SUSP
INTRAMUSCULAR | Status: DC | PRN
  Administered 2021-02-16: 20 mL

## 2021-02-16 MED ORDER — PHENYLEPHRINE HCL-NACL 10-0.9 MG/250ML-% IV SOLN
INTRAVENOUS | Status: DC | PRN
  Administered 2021-02-16: 30 ug/min via INTRAVENOUS

## 2021-02-16 MED ORDER — DEXAMETHASONE SODIUM PHOSPHATE 10 MG/ML IJ SOLN
INTRAMUSCULAR | Status: AC
  Filled 2021-02-16: qty 1

## 2021-02-16 MED ORDER — BUPIVACAINE-EPINEPHRINE 0.5% -1:200000 IJ SOLN
INTRAMUSCULAR | Status: DC | PRN
  Administered 2021-02-16: 8 mL

## 2021-02-16 MED ORDER — PHENYLEPHRINE 40 MCG/ML (10ML) SYRINGE FOR IV PUSH (FOR BLOOD PRESSURE SUPPORT)
PREFILLED_SYRINGE | INTRAVENOUS | Status: DC | PRN
  Administered 2021-02-16: 120 ug via INTRAVENOUS
  Administered 2021-02-16 (×2): 80 ug via INTRAVENOUS
  Administered 2021-02-16 (×2): 120 ug via INTRAVENOUS

## 2021-02-16 MED ORDER — SERTRALINE HCL 50 MG PO TABS
200.0000 mg | ORAL_TABLET | Freq: Every day | ORAL | Status: DC
  Administered 2021-02-17 – 2021-02-18 (×2): 200 mg via ORAL
  Filled 2021-02-16 (×2): qty 4

## 2021-02-16 MED ORDER — SODIUM CHLORIDE 0.9% FLUSH: 3.0000 mL | INTRAVENOUS | Status: DC | PRN

## 2021-02-16 MED ORDER — NICOTINE 21 MG/24HR TD PT24
21.0000 mg | MEDICATED_PATCH | Freq: Every day | TRANSDERMAL | Status: DC | PRN
  Administered 2021-02-17: 21 mg via TRANSDERMAL
  Filled 2021-02-16: qty 1

## 2021-02-16 MED ORDER — KETAMINE HCL 10 MG/ML IJ SOLN
INTRAMUSCULAR | Status: DC | PRN
  Administered 2021-02-16: 10 mg via INTRAVENOUS
  Administered 2021-02-16: 30 mg via INTRAVENOUS
  Administered 2021-02-16: 10 mg via INTRAVENOUS

## 2021-02-16 MED ORDER — ORAL CARE MOUTH RINSE: 15.0000 mL | Freq: Once | OROMUCOSAL | Status: AC

## 2021-02-16 MED ORDER — HYDROCODONE-ACETAMINOPHEN 10-325 MG PO TABS
1.0000 | ORAL_TABLET | ORAL | Status: DC | PRN
  Administered 2021-02-16 – 2021-02-18 (×9): 1 via ORAL
  Filled 2021-02-16 (×9): qty 1

## 2021-02-16 MED ORDER — DEXAMETHASONE SODIUM PHOSPHATE 10 MG/ML IJ SOLN
INTRAMUSCULAR | Status: DC | PRN
  Administered 2021-02-16: 10 mg via INTRAVENOUS

## 2021-02-16 MED ORDER — HYDROMORPHONE HCL 1 MG/ML IJ SOLN: 0.5000 mg | INTRAMUSCULAR | Status: DC | PRN

## 2021-02-16 MED ORDER — HEMOSTATIC AGENTS (NO CHARGE) OPTIME
TOPICAL | Status: DC | PRN
  Administered 2021-02-16 (×2): 1 via TOPICAL

## 2021-02-16 MED ORDER — KETAMINE HCL 50 MG/5ML IJ SOSY
PREFILLED_SYRINGE | INTRAMUSCULAR | Status: AC
  Filled 2021-02-16: qty 5

## 2021-02-16 MED ORDER — SUGAMMADEX SODIUM 200 MG/2ML IV SOLN
INTRAVENOUS | Status: DC | PRN
  Administered 2021-02-16: 200 mg via INTRAVENOUS

## 2021-02-16 MED ORDER — BICTEGRAVIR-EMTRICITAB-TENOFOV 50-200-25 MG PO TABS
1.0000 | ORAL_TABLET | Freq: Every day | ORAL | Status: DC
  Administered 2021-02-17 – 2021-02-18 (×2): 1 via ORAL
  Filled 2021-02-16 (×2): qty 1

## 2021-02-16 MED ORDER — PHENOL 1.4 % MT LIQD: 1.0000 | OROMUCOSAL | Status: DC | PRN

## 2021-02-16 MED ORDER — THROMBIN 5000 UNITS EX SOLR
CUTANEOUS | Status: AC
  Filled 2021-02-16: qty 5000

## 2021-02-16 MED ORDER — ACETAMINOPHEN 325 MG PO TABS: 650.0000 mg | ORAL_TABLET | ORAL | Status: DC | PRN

## 2021-02-16 MED ORDER — VASOPRESSIN 20 UNIT/ML IV SOLN
INTRAVENOUS | Status: AC
  Filled 2021-02-16: qty 1

## 2021-02-16 MED ORDER — EPHEDRINE SULFATE 50 MG/ML IJ SOLN
INTRAMUSCULAR | Status: DC | PRN
  Administered 2021-02-16: 10 mg via INTRAVENOUS

## 2021-02-16 MED ORDER — ROCURONIUM BROMIDE 10 MG/ML (PF) SYRINGE
PREFILLED_SYRINGE | INTRAVENOUS | Status: DC | PRN
  Administered 2021-02-16: 60 mg via INTRAVENOUS
  Administered 2021-02-16 (×2): 20 mg via INTRAVENOUS
  Administered 2021-02-16: 10 mg via INTRAVENOUS

## 2021-02-16 MED ORDER — GABAPENTIN 600 MG PO TABS
900.0000 mg | ORAL_TABLET | Freq: Three times a day (TID) | ORAL | Status: DC
  Administered 2021-02-16 – 2021-02-18 (×5): 900 mg via ORAL
  Filled 2021-02-16 (×5): qty 2

## 2021-02-16 MED ORDER — ONDANSETRON HCL 4 MG/2ML IJ SOLN: 4.0000 mg | Freq: Four times a day (QID) | INTRAMUSCULAR | Status: DC | PRN

## 2021-02-16 MED ORDER — CHLORHEXIDINE GLUCONATE CLOTH 2 % EX PADS: 6.0000 | MEDICATED_PAD | Freq: Once | CUTANEOUS | Status: DC

## 2021-02-16 MED ORDER — LISINOPRIL 20 MG PO TABS
40.0000 mg | ORAL_TABLET | Freq: Every day | ORAL | Status: DC
  Administered 2021-02-17: 40 mg via ORAL
  Filled 2021-02-16 (×3): qty 2

## 2021-02-16 MED ORDER — CHLORHEXIDINE GLUCONATE 0.12 % MT SOLN
15.0000 mL | Freq: Every day | OROMUCOSAL | Status: DC
  Administered 2021-02-16 – 2021-02-18 (×2): 15 mL via OROMUCOSAL
  Filled 2021-02-16 (×3): qty 15

## 2021-02-16 MED ORDER — CEFAZOLIN SODIUM-DEXTROSE 2-4 GM/100ML-% IV SOLN
2.0000 g | Freq: Three times a day (TID) | INTRAVENOUS | Status: AC
  Administered 2021-02-16 – 2021-02-17 (×2): 2 g via INTRAVENOUS
  Filled 2021-02-16 (×2): qty 100

## 2021-02-16 MED ORDER — LIDOCAINE-EPINEPHRINE 1 %-1:100000 IJ SOLN
INTRAMUSCULAR | Status: AC
  Filled 2021-02-16: qty 1

## 2021-02-16 MED ORDER — TERBINAFINE HCL 250 MG PO TABS
250.0000 mg | ORAL_TABLET | Freq: Every day | ORAL | Status: DC
  Administered 2021-02-17 – 2021-02-18 (×2): 250 mg via ORAL
  Filled 2021-02-16 (×2): qty 1

## 2021-02-16 MED ORDER — LIDOCAINE-EPINEPHRINE 1 %-1:100000 IJ SOLN
INTRAMUSCULAR | Status: DC | PRN
  Administered 2021-02-16: 8 mL

## 2021-02-16 MED ORDER — SENNOSIDES-DOCUSATE SODIUM 8.6-50 MG PO TABS
1.0000 | ORAL_TABLET | Freq: Every evening | ORAL | Status: DC | PRN
  Administered 2021-02-18: 1 via ORAL
  Filled 2021-02-16: qty 1

## 2021-02-16 MED ORDER — ROCURONIUM BROMIDE 10 MG/ML (PF) SYRINGE
PREFILLED_SYRINGE | INTRAVENOUS | Status: AC
  Filled 2021-02-16: qty 10

## 2021-02-16 MED ORDER — SODIUM CHLORIDE 0.9% FLUSH
3.0000 mL | Freq: Two times a day (BID) | INTRAVENOUS | Status: DC
  Administered 2021-02-17: 3 mL via INTRAVENOUS

## 2021-02-16 MED ORDER — LACTATED RINGERS IV SOLN: INTRAVENOUS | Status: DC

## 2021-02-16 MED ORDER — FENTANYL CITRATE (PF) 100 MCG/2ML IJ SOLN
INTRAMUSCULAR | Status: DC | PRN
  Administered 2021-02-16 (×2): 25 ug via INTRAVENOUS
  Administered 2021-02-16: 100 ug via INTRAVENOUS

## 2021-02-16 MED ORDER — ALUM & MAG HYDROXIDE-SIMETH 200-200-20 MG/5ML PO SUSP: 30.0000 mL | Freq: Four times a day (QID) | ORAL | Status: DC | PRN

## 2021-02-16 MED ORDER — PROPOFOL 10 MG/ML IV BOLUS
INTRAVENOUS | Status: AC
  Filled 2021-02-16: qty 40

## 2021-02-16 MED ORDER — MIDAZOLAM HCL 5 MG/5ML IJ SOLN
INTRAMUSCULAR | Status: DC | PRN
  Administered 2021-02-16: 2 mg via INTRAVENOUS

## 2021-02-16 MED ORDER — MORPHINE SULFATE (PF) 0.5 MG/ML IJ SOLN
5.0000 mg | Freq: Once | INTRAMUSCULAR | Status: AC
  Administered 2021-02-16: 1 mg via EPIDURAL
  Filled 2021-02-16: qty 10

## 2021-02-16 MED ORDER — VANCOMYCIN HCL 1000 MG IV SOLR
INTRAVENOUS | Status: AC
  Filled 2021-02-16: qty 1000

## 2021-02-16 MED ORDER — ONDANSETRON HCL 4 MG PO TABS: 4.0000 mg | ORAL_TABLET | Freq: Four times a day (QID) | ORAL | Status: DC | PRN

## 2021-02-16 MED ORDER — ATORVASTATIN CALCIUM 10 MG PO TABS
20.0000 mg | ORAL_TABLET | Freq: Every day | ORAL | Status: DC
  Administered 2021-02-17 – 2021-02-18 (×2): 20 mg via ORAL
  Filled 2021-02-16 (×2): qty 2

## 2021-02-16 MED ORDER — ACETAMINOPHEN 500 MG PO TABS
1000.0000 mg | ORAL_TABLET | Freq: Once | ORAL | Status: AC
  Administered 2021-02-16: 1000 mg via ORAL
  Filled 2021-02-16: qty 2

## 2021-02-16 MED ORDER — LIDOCAINE 2% (20 MG/ML) 5 ML SYRINGE
INTRAMUSCULAR | Status: AC
  Filled 2021-02-16: qty 5

## 2021-02-16 MED ORDER — 0.9 % SODIUM CHLORIDE (POUR BTL) OPTIME
TOPICAL | Status: DC | PRN
  Administered 2021-02-16: 1000 mL

## 2021-02-16 MED ORDER — ACETAMINOPHEN 650 MG RE SUPP: 650.0000 mg | RECTAL | Status: DC | PRN

## 2021-02-16 MED ORDER — ONDANSETRON HCL 4 MG/2ML IJ SOLN
INTRAMUSCULAR | Status: DC | PRN
  Administered 2021-02-16: 4 mg via INTRAVENOUS

## 2021-02-16 MED ORDER — HYDROMORPHONE HCL 1 MG/ML IJ SOLN: 0.2500 mg | INTRAMUSCULAR | Status: DC | PRN

## 2021-02-16 MED ORDER — MIRTAZAPINE 30 MG PO TABS
30.0000 mg | ORAL_TABLET | Freq: Every day | ORAL | Status: DC
  Administered 2021-02-16: 30 mg via ORAL
  Filled 2021-02-16 (×3): qty 1

## 2021-02-16 SURGICAL SUPPLY — 66 items
BAG BANDED W/RUBBER/TAPE 36X54 (MISCELLANEOUS) ×4 IMPLANT
BAG COUNTER SPONGE SURGICOUNT (BAG) ×2 IMPLANT
BAND RUBBER #18 3X1/16 STRL (MISCELLANEOUS) ×4 IMPLANT
BASKET BONE COLLECTION (BASKET) IMPLANT
BUR CARBIDE MATCH 3.0 (BURR) ×2 IMPLANT
CARTRIDGE OIL MAESTRO DRILL (MISCELLANEOUS) IMPLANT
CNTNR URN SCR LID CUP LEK RST (MISCELLANEOUS) ×1 IMPLANT
CONT SPEC 4OZ STRL OR WHT (MISCELLANEOUS) ×1
COVER BACK TABLE 60X90IN (DRAPES) ×2 IMPLANT
COVER MAYO STAND STRL (DRAPES) ×2 IMPLANT
DECANTER SPIKE VIAL GLASS SM (MISCELLANEOUS) ×2 IMPLANT
DERMABOND ADVANCED (GAUZE/BANDAGES/DRESSINGS) ×1
DERMABOND ADVANCED .7 DNX12 (GAUZE/BANDAGES/DRESSINGS) ×1 IMPLANT
DIFFUSER DRILL AIR PNEUMATIC (MISCELLANEOUS) ×2 IMPLANT
DRAIN JACKSON RD 7FR 3/32 (WOUND CARE) IMPLANT
DRAPE LAPAROTOMY 100X72X124 (DRAPES) ×2 IMPLANT
DRAPE MICROSCOPE LEICA (MISCELLANEOUS) ×2 IMPLANT
DRAPE SURG 17X23 STRL (DRAPES) ×2 IMPLANT
DRSG OPSITE POSTOP 3X4 (GAUZE/BANDAGES/DRESSINGS) ×2 IMPLANT
DRSG OPSITE POSTOP 4X8 (GAUZE/BANDAGES/DRESSINGS) ×2 IMPLANT
DRSG PAD ABDOMINAL 8X10 ST (GAUZE/BANDAGES/DRESSINGS) IMPLANT
DURAPREP 26ML APPLICATOR (WOUND CARE) ×2 IMPLANT
ELECT BLADE INSULATED 4IN (ELECTROSURGICAL) ×2
ELECT REM PT RETURN 9FT ADLT (ELECTROSURGICAL) ×2
ELECTRODE BLADE INSULATED 4IN (ELECTROSURGICAL) ×1 IMPLANT
ELECTRODE REM PT RTRN 9FT ADLT (ELECTROSURGICAL) ×1 IMPLANT
EVACUATOR 1/8 PVC DRAIN (DRAIN) ×2 IMPLANT
GAUZE 4X4 16PLY ~~LOC~~+RFID DBL (SPONGE) ×4 IMPLANT
GAUZE SPONGE 4X4 12PLY STRL (GAUZE/BANDAGES/DRESSINGS) ×2 IMPLANT
GLOVE SRG 8 PF TXTR STRL LF DI (GLOVE) ×3 IMPLANT
GLOVE SURG LTX SZ8 (GLOVE) ×4 IMPLANT
GLOVE SURG UNDER POLY LF SZ7 (GLOVE) ×4 IMPLANT
GLOVE SURG UNDER POLY LF SZ7.5 (GLOVE) ×8 IMPLANT
GLOVE SURG UNDER POLY LF SZ8 (GLOVE) ×3
GOWN STRL REUS W/ TWL LRG LVL3 (GOWN DISPOSABLE) ×1 IMPLANT
GOWN STRL REUS W/ TWL XL LVL3 (GOWN DISPOSABLE) ×4 IMPLANT
GOWN STRL REUS W/TWL 2XL LVL3 (GOWN DISPOSABLE) ×2 IMPLANT
GOWN STRL REUS W/TWL LRG LVL3 (GOWN DISPOSABLE) ×1
GOWN STRL REUS W/TWL XL LVL3 (GOWN DISPOSABLE) ×4
HEMOSTAT POWDER SURGIFOAM 1G (HEMOSTASIS) ×4 IMPLANT
KIT BASIN OR (CUSTOM PROCEDURE TRAY) ×2 IMPLANT
KIT POSITION SURG JACKSON T1 (MISCELLANEOUS) ×2 IMPLANT
KIT TURNOVER KIT B (KITS) ×2 IMPLANT
MARKER SKIN DUAL TIP RULER LAB (MISCELLANEOUS) ×2 IMPLANT
NEEDLE HYPO 25X1 1.5 SAFETY (NEEDLE) ×2 IMPLANT
NEEDLE SPNL 22GX3.5 QUINCKE BK (NEEDLE) ×6 IMPLANT
NS IRRIG 1000ML POUR BTL (IV SOLUTION) ×2 IMPLANT
OIL CARTRIDGE MAESTRO DRILL (MISCELLANEOUS)
PACK LAMINECTOMY NEURO (CUSTOM PROCEDURE TRAY) ×2 IMPLANT
PAD ARMBOARD 7.5X6 YLW CONV (MISCELLANEOUS) IMPLANT
PATTIES SURGICAL .5 X.5 (GAUZE/BANDAGES/DRESSINGS) IMPLANT
PATTIES SURGICAL .5 X1 (DISPOSABLE) ×2 IMPLANT
PATTIES SURGICAL 1X1 (DISPOSABLE) IMPLANT
SEALANT ADHERUS EXTEND TIP (MISCELLANEOUS) ×2 IMPLANT
SPONGE SURGIFOAM ABS GEL SZ50 (HEMOSTASIS) ×2 IMPLANT
SPONGE T-LAP 4X18 ~~LOC~~+RFID (SPONGE) ×4 IMPLANT
STAPLER VISISTAT 35W (STAPLE) ×2 IMPLANT
SUT PROLENE 5 0 C1 (SUTURE) ×2 IMPLANT
SUT VIC AB 0 CT1 27 (SUTURE) ×3
SUT VIC AB 0 CT1 27XBRD ANBCTR (SUTURE) ×3 IMPLANT
SUT VIC AB 2-0 CP2 18 (SUTURE) ×2 IMPLANT
SUT VIC AB 3-0 SH 8-18 (SUTURE) ×4 IMPLANT
TOWEL GREEN STERILE (TOWEL DISPOSABLE) ×2 IMPLANT
TOWEL GREEN STERILE FF (TOWEL DISPOSABLE) ×2 IMPLANT
TRAY FOLEY MTR SLVR 16FR STAT (SET/KITS/TRAYS/PACK) ×2 IMPLANT
WATER STERILE IRR 1000ML POUR (IV SOLUTION) ×2 IMPLANT

## 2021-02-16 NOTE — Transfer of Care (Signed)
Immediate Anesthesia Transfer of Care Note  Patient: Daryl Shelton  Procedure(s) Performed: OPEN LUMBAR LAMINECTOMY, LUMBAR TWO-THREE, LUMBAR THREE-FOUR, LUMBAR FOUR-FIVE,LUMBAR FIVE-SACRAL ONE  Patient Location: PACU  Anesthesia Type:General  Level of Consciousness: drowsy  Airway & Oxygen Therapy: Patient Spontanous Breathing and Patient connected to face mask oxygen  Post-op Assessment: Report given to RN and Post -op Vital signs reviewed and stable  Post vital signs: Reviewed and stable  Last Vitals:  Vitals Value Taken Time  BP 109/69 02/16/21 1159  Temp 36.5 C 02/16/21 1159  Pulse 87 02/16/21 1203  Resp 15 02/16/21 1203  SpO2 95 % 02/16/21 1203  Vitals shown include unvalidated device data.  Last Pain:  Vitals:   02/16/21 0608  TempSrc:   PainSc: 0-No pain         Complications: No notable events documented.

## 2021-02-16 NOTE — Anesthesia Procedure Notes (Addendum)
Procedure Name: Intubation Date/Time: 02/16/2021 7:41 AM Performed by: Erick Colace, RN Pre-anesthesia Checklist: Patient identified, Emergency Drugs available, Suction available and Patient being monitored Patient Re-evaluated:Patient Re-evaluated prior to induction Oxygen Delivery Method: Circle system utilized Preoxygenation: Pre-oxygenation with 100% oxygen Induction Type: IV induction Ventilation: Two handed mask ventilation required and Oral airway inserted - appropriate to patient size Laryngoscope Size: 4 and Mac Grade View: Grade I Tube type: Oral Tube size: 7.5 mm Number of attempts: 1 Airway Equipment and Method: Stylet, Oral airway and Bite block Placement Confirmation: ETT inserted through vocal cords under direct vision, positive ETCO2 and breath sounds checked- equal and bilateral Secured at: 22 cm Tube secured with: Tape Dental Injury: Teeth and Oropharynx as per pre-operative assessment

## 2021-02-16 NOTE — H&P (Signed)
Providing Compassionate, Quality Care - Together  NEUROSURGERY HISTORY & PHYSICAL   Daryl Shelton is an 60 y.o. male.   Chief Complaint: Bilateral lower extremity numbness HPI: This is a 60 year old male with a history of chronic bilateral lower extremity numbness that has caused him difficulty walking.  Work-up revealed severe lumbar stenosis L2-S1.  He has numbness in his legs and groin area.  He has difficulty standing or walking for more than just a few minutes.  His symptoms completely relieved when he sits down.  He denies any bowel or bladder changes.  He has no focal weakness.  Denies any fevers.  Past Medical History:  Diagnosis Date   Anxiety    Arthritis    Bell's palsy    Depression    HIV (human immunodeficiency virus infection) ([place])    Hypertension     History reviewed. No pertinent surgical history.  Family History  Problem Relation Age of Onset   CAD Father    Social History:  reports that he has been smoking cigarettes. He has been smoking an average of 0.50 packs per day. He has never used smokeless tobacco. He reports previous alcohol use. He reports that he does not use drugs.  Allergies:  Allergies  Allergen Reactions   Varenicline Other (See Comments)    CNS symptoms - anxiety, agitation     Pollen Extract Other (See Comments)    Congestion     Medications Prior to Admission  Medication Sig Dispense Refill   atorvastatin (LIPITOR) 20 MG tablet Take 20 mg by mouth daily.     BIKTARVY 50-200-25 MG TABS tablet TAKE 1 TABLET BY MOUTH DAILY (Patient taking differently: Take 1 tablet by mouth daily.) 90 tablet 1   chlorhexidine (PERIDEX) 0.12 % solution Use as directed 15 mLs in the mouth or throat daily.     gabapentin (NEURONTIN) 600 MG tablet Take 900 mg by mouth 3 (three) times daily.     hydrochlorothiazide (HYDRODIURIL) 25 MG tablet TAKE 1 TABLET(25 MG) BY MOUTH DAILY (Patient taking differently: Take 25 mg by mouth daily.) 90 tablet 3    ketoconazole (NIZORAL) 2 % cream Apply 1 application topically daily. 60 g 2   lisinopril (ZESTRIL) 40 MG tablet Take 40 mg by mouth daily.     mirtazapine (REMERON) 30 MG tablet Take 30 mg by mouth at bedtime.     Multiple Vitamins-Minerals (MULTIVITAMIN ADULTS 50+) TABS Take 1 tablet by mouth daily.     nicotine (NICODERM CQ - DOSED IN MG/24 HOURS) 21 mg/24hr patch Place 21 mg onto the skin daily as needed (nicontine dependence).     sertraline (ZOLOFT) 100 MG tablet Take 200 mg by mouth daily.     terbinafine (LAMISIL) 250 MG tablet Take 1 tablet (250 mg total) by mouth daily. 90 tablet 0   tadalafil (CIALIS) 10 MG tablet Take 1 tablet (10 mg total) by mouth every other day as needed for erectile dysfunction. (Patient not taking: No sig reported) 10 tablet 1    No results found for this or any previous visit (from the past 48 hour(s)). No results found.  ROS All pertinent positives and negatives listed in HPI above   Blood pressure 130/80, pulse 83, temperature 98.1 F (36.7 C), temperature source Oral, resp. rate 17, height 5\' 10"  (1.778 m), weight 106.6 kg, SpO2 98 %. Physical Exam  Awake alert oriented x3 Cranial nerves II through XII intact Face symmetric PERRLA EOMI Bilateral upper extremity 5/5 Bilateral  lower extremity 4+/5 Sensation intact to light touch   Assessment/Plan 60 year old male with  1.  Lumbar stenosis, L2-3, L3-4, L4-5, L5-S1 with neurogenic claudication   -OR today for open laminectomy L2-S1.  All risks, benefits and expected outcomes were discussed with the patient agreed upon.   Thank you for allowing me to participate in this patient's care.  Please do not hesitate to call with questions or concerns.   Elwin Sleight, [facility] Cell: [phone]

## 2021-02-16 NOTE — Progress Notes (Signed)
   Providing Compassionate, Quality Care - Together  NEUROSURGERY PROGRESS NOTE   S: Patient seen and examined in recovery  O: EXAM:  BP 128/70 (BP Location: Right Arm)   Pulse 81   Temp 97.8 F (36.6 C) (Oral)   Resp 18   Ht 5\' 10"  (1.778 m)   Wt 106.6 kg   SpO2 97%   BMI 33.72 kg/m   Awake, alert, oriented x3 Speech fluent, appropriate  CNs grossly intact  Hemovac in place Dressing clean dry and intact Sensory intact light touch 5/5 BUE/BLE   ASSESSMENT:  60 y.o. male with   L2-S1 severe stenosis with neurogenic claudication  -Status post L2-S1 open laminectomy on 02/16/2021  PLAN: -PT/OT -Pain control -DVT prophylaxis -Monitor Hemovac    Thank you for allowing me to participate in this patient's care.  Please do not hesitate to call with questions or concerns.   Elwin Sleight, [facility] Cell: [phone]

## 2021-02-16 NOTE — Anesthesia Postprocedure Evaluation (Signed)
Anesthesia Post Note  Patient: Daryl Shelton  Procedure(s) Performed: OPEN LUMBAR LAMINECTOMY, LUMBAR TWO-THREE, LUMBAR THREE-FOUR, LUMBAR FOUR-FIVE,LUMBAR FIVE-SACRAL ONE     Patient location during evaluation: PACU Anesthesia Type: General Level of consciousness: awake and alert Pain management: pain level controlled Vital Signs Assessment: post-procedure vital signs reviewed and stable Respiratory status: spontaneous breathing, nonlabored ventilation and respiratory function stable Cardiovascular status: blood pressure returned to baseline and stable Postop Assessment: no apparent nausea or vomiting Anesthetic complications: no   No notable events documented.  Last Vitals:  Vitals:   02/16/21 1228 02/16/21 1320  BP: 107/68 102/63  Pulse: 87 74  Resp: 18 20  Temp: 36.8 C 36.6 C  SpO2: 97% 98%    Last Pain:  Vitals:   02/16/21 1320  TempSrc: Oral  PainSc:                  Tahj Njoku,W. EDMOND

## 2021-02-16 NOTE — Op Note (Signed)
Providing Compassionate, Quality Care - Together  Date of service: 02/16/2021  PREOP DIAGNOSIS:  L2-3, L3-4, L4-5, L5-S1 lumbar stenosis with neurogenic claudication  POSTOP DIAGNOSIS: Same  PROCEDURE: Bilateral L2, L3, L4, L5, S1 laminectomies for decompression of neural elements with partial bilateral L2-3, L3-4, L4-5, L5-S1 medial facetectomies Intraoperative use of microscope for microdissection Intraoperative use of fluoroscopy  SURGEON: Dr. Pieter Partridge C. Josias Tomerlin, DO  ASSISTANT: Dr. Consuella Lose, MD  SECOND ASSISTANT: Weston Brass, NP  ANESTHESIA: General Endotracheal  EBL: 150 cc  SPECIMENS: None  DRAINS: Medium Hemovac  COMPLICATIONS: None  CONDITION: Hemodynamically stable  HISTORY: Daryl Shelton is a 60 y.o. male that presented with bilateral lower extremity neurogenic claudication that was severe in nature.  He had difficulty standing or walking for greater than 1 to 2 minutes at which point he would have severe numbness in his lower extremities and groin.  MRI revealed severe stenosis at L3-4, L4-5, L5-S1 with moderate to severe stenosis at L2-3 all due to ligamentum hypertrophy and epidural lipomatosis.  We discussed treatment options including physical therapy, steroid injections, monitoring versus surgical decompression.  I recommended surgical decompression given the severity of his symptoms and alteration of his lifestyle.  He agreed with surgical intervention.  PROCEDURE IN DETAIL: The patient was brought to the operating room. After induction of general anesthesia, the patient was positioned on the operative table in the prone position. All pressure points were meticulously padded. Skin incision was then marked out and prepped and draped in the usual sterile fashion. A physician driven timeout was performed.  Using a 10 blade, a midline incision was made over the L2-S1 spinous processes.  Using Bovie electrocautery, soft tissue was dissected at midline  down to the lumbosacral fascia.  Using subperiosteal dissection with Bovie electrocautery, the spinous processes, lamina of L2-S1 were exposed.  Deep retractors were placed in the wound.  Using lateral fluoroscopy, the appropriate levels were confirmed.  At this point the microscope was sterilely draped and brought into the field for the remainder of the case for decompression of neural elements and microdissection.  Using a high-speed drill, the L5 lamina was removed bilaterally as well as the medial facets at L5-S1 up to the ligamentum flavum attachment.  Attention was then turned to removal of the superior bilateral portion of the S1 lamina using the high-speed drill to the epidural space.  The ligamentum flavum was then carefully dissected free from the thecal sac.  Using Kerrison rongeurs and pituitary rongeurs and micro curettes, the ligamentum flavum was resected bilaterally from the central canal and lateral recess.  The thecal sac was visualized and noted to be pulsatile.  The ligamentum flavum at this level was quite hypertrophied and overgrown as well as calcified.  Once the ligamentum flavum was resected and the medial facetectomy was performed with Kerrison rongeurs, the lateral recess and foramina were explored with a blunt nerve hook.  Bilaterally, the lateral recesses appeared decompressed as well as the bilateral foramina.  Epidural hemostasis was achieved with Surgifoam.  Attention was then turned to perform the L4 laminectomy.  Using a high-speed drill, the L4 lamina was removed bilaterally as well as the medial facets at L4-5 up to the ligamentum flavum attachment.  Attention was then turned to removal of the superior bilateral portion of the L5 lamina using the high-speed drill to the epidural space.  The ligamentum flavum was then carefully dissected free from the thecal sac.  Using Kerrison rongeurs and pituitary rongeurs and  micro curettes, the ligamentum flavum was resected bilaterally  from the central canal and lateral recess.  The thecal sac was visualized and noted to be pulsatile.  Once the ligamentum flavum was resected and the medial facetectomy was performed with Kerrison rongeurs, the lateral recess and foramina were explored with a blunt nerve hook.  Bilaterally, the lateral recesses appeared decompressed as well as the bilateral foramina.  There was a small pinhole durotomy on the inferior lateral portion on the right at this level.  The arachnoid appeared intact and there was no egress of CSF.  Using a 5-0 Prolene suture, and a single interrupted fashion, I repaired the durotomy.  Again there was no egress of CSF at this time.  Epidural hemostasis was achieved with Surgifoam.  Attention was then turned to perform the L3 laminectomy.  Using a high-speed drill, the L3 lamina was removed bilaterally as well as the medial facets at L3-4 up to the ligamentum flavum attachment.  Attention was then turned to removal of the superior bilateral portion of the L4 lamina using the high-speed drill to the epidural space.  The ligamentum flavum was then carefully dissected free from the thecal sac.  Using Kerrison rongeurs and pituitary rongeurs and micro curettes, the ligamentum flavum was resected bilaterally from the central canal and lateral recess.  The thecal sac was visualized and noted to be pulsatile.  The ligamentum flavum was quite hypertrophied, thickened and calcified.  Once the ligamentum flavum was resected and the medial facetectomy was performed with Kerrison rongeurs, the lateral recess and foramina were explored with a blunt nerve hook.  Bilaterally, the lateral recesses appeared decompressed as well as the bilateral foramina.  Epidural hemostasis was achieved with Surgifoam.  Attention was then turned to perform the L2 laminectomy.  Using a high-speed drill, the L2 lamina was removed bilaterally as well as the medial facets at L2-3 up to the ligamentum flavum attachment.   Attention was then turned to removal of the superior bilateral portion of the L3 lamina using the high-speed drill to the epidural space.  The ligamentum flavum was then carefully dissected free from the thecal sac.  Using Kerrison rongeurs and pituitary rongeurs and micro curettes, the ligamentum flavum was resected bilaterally from the central canal and lateral recess.  The thecal sac was visualized and noted to be pulsatile.  There was significant epidural fat at this level which was removed with micro curettes and suction.  Once the ligamentum flavum was resected and the medial facetectomy was performed with Kerrison rongeurs, the lateral recess and foramina were explored with a blunt nerve hook.  Bilaterally, the lateral recesses appeared decompressed as well as the bilateral foramina.  Epidural hemostasis was achieved with Surgifoam.  Deep retractors were taken out of the wound.  The wound was copiously irrigated.  The wound was explored and hemostasis was achieved with bipolar cautery and Surgifoam.  Again the wound was copiously irrigated and explored and noted to be excellently hemostatic.  A mixture of Avitene and 2 cc of Duramorph were created and placed in the epidural space.  A medium Hemovac was tunneled superior laterally.  The wound was closed in layers, 0 Vicryl's for the muscle and fascia.  The dermis was closed with 2-0 and 3-0 Vicryl sutures.  The skin was closed with skin glue.  Sterile dressing was applied.  At the end of the case all sponge, needle, and instrument counts were correct. The patient was then transferred to the stretcher, extubated, and  taken to the post-anesthesia care unit in stable hemodynamic condition.

## 2021-02-17 DIAGNOSIS — M48062 Spinal stenosis, lumbar region with neurogenic claudication: Secondary | ICD-10-CM | POA: Diagnosis not present

## 2021-02-17 DIAGNOSIS — Z79899 Other long term (current) drug therapy: Secondary | ICD-10-CM | POA: Diagnosis not present

## 2021-02-17 DIAGNOSIS — I10 Essential (primary) hypertension: Secondary | ICD-10-CM | POA: Diagnosis not present

## 2021-02-17 DIAGNOSIS — F17210 Nicotine dependence, cigarettes, uncomplicated: Secondary | ICD-10-CM | POA: Diagnosis not present

## 2021-02-17 NOTE — Evaluation (Signed)
Occupational Therapy Evaluation Patient Details Name: Daryl Shelton MRN: [id number] DOB: 05/06/1961 Today's Date: 02/17/2021    History of Present Illness 60 year old male with a history of chronic bilateral lower extremity numbness.  S/p L2-S1 open laminectomy.  PMH includes: Anxiety, Arthritis, Bell's palsy, Depression, HIV, Hypertension.   Clinical Impression   Patient admitted for the diagnosis and procedure above.  PTA he lives with his sister and spouse, who are able to assist as needed.  Barriers are listed below.  Currently he is close to his baseline, and verbalizes a good understanding of back precautions.  OT will see him tomorrow to ensure compliance of back precautions during ADL.      Follow Up Recommendations  No OT follow up    Equipment Recommendations  Tub/shower seat    Recommendations for Other Services       Precautions / Restrictions Precautions Precautions: Back Precaution Booklet Issued: Yes (comment) Precaution Comments: Reviewed Restrictions Weight Bearing Restrictions: No      Mobility Bed Mobility Overal bed mobility: Modified Independent               Patient Response: Cooperative  Transfers Overall transfer level: Independent                    Balance Overall balance assessment: No apparent balance deficits (not formally assessed)                                         ADL either performed or assessed with clinical judgement   ADL Overall ADL's : At baseline                                       General ADL Comments: patient able to figure four sit, full ADL next date upon discharge     Vision Baseline Vision/History: Wears glasses Patient Visual Report: No change from baseline       Perception     Praxis      Pertinent Vitals/Pain Pain Assessment: Faces Faces Pain Scale: Hurts little more Pain Location: back Pain Descriptors / Indicators: Operative site guarding Pain  Intervention(s): Monitored during session     Hand Dominance Right   Extremity/Trunk Assessment Upper Extremity Assessment Upper Extremity Assessment: Overall WFL for tasks assessed   Lower Extremity Assessment Lower Extremity Assessment: Defer to PT evaluation   Cervical / Trunk Assessment Cervical / Trunk Assessment: Other exceptions Cervical / Trunk Exceptions: spinal surgery   Communication Communication Communication: No difficulties   Cognition Arousal/Alertness: Awake/alert Behavior During Therapy: WFL for tasks assessed/performed Overall Cognitive Status: Within Functional Limits for tasks assessed                                     General Comments       Exercises     Shoulder Instructions      Home Living Family/patient expects to be discharged to:: Private residence Living Arrangements: Spouse/significant other;Other (Comment) (Sister) Available Help at Discharge: Family;Available 24 hours/day Type of Home: House Home Access: Stairs to enter [employer] of Steps: 6 Entrance Stairs-Rails: Right Home Layout: One level     Bathroom Shower/Tub: Tub/shower unit;Walk-in shower   Bathroom Toilet: Standard Bathroom Accessibility: Yes How  Accessible: Accessible via walker Home Equipment: None          Prior Functioning/Environment Level of Independence: Independent                 OT Problem List: Pain      OT Treatment/Interventions: Self-care/ADL training;Therapeutic activities;Balance training    OT Goals(Current goals can be found in the care plan section) Acute Rehab OT Goals Patient Stated Goal: Get back to my garden OT Goal Formulation: With patient Time For Goal Achievement: 03/03/21 Potential to Achieve Goals: Good ADL Goals Pt Will Perform Lower Body Bathing: with modified independence;sit to/from stand Pt Will Perform Lower Body Dressing: with modified independence;sit to/from stand  OT Frequency: Min  2X/week   Barriers to D/C:    none noted       Co-evaluation              AM-PAC OT "6 Clicks" Daily Activity     Outcome Measure Help from another person eating meals?: None Help from another person taking care of personal grooming?: None Help from another person toileting, which includes using toliet, bedpan, or urinal?: None Help from another person bathing (including washing, rinsing, drying)?: None Help from another person to put on and taking off regular upper body clothing?: None Help from another person to put on and taking off regular lower body clothing?: None 6 Click Score: 24   End of Session Equipment Utilized During Treatment: Gait belt Nurse Communication: Mobility status  Activity Tolerance: Patient tolerated treatment well Patient left: in bed;with call bell/phone within reach  OT Visit Diagnosis: Pain Pain - Right/Left:  (back)                Time: 9323-5573 OT Time Calculation (min): 20 min Charges:  OT General Charges $OT Visit: 1 Visit OT Evaluation $OT Eval Moderate Complexity: 1 Mod  02/17/2021  Daryl Shelton, OTR/L  Acute Rehabilitation Services  Office:  [phone]   [name] 02/17/2021, 9:45 AM

## 2021-02-17 NOTE — Plan of Care (Signed)
  Problem: Education: Goal: Ability to verbalize activity precautions or restrictions will improve Outcome: Progressing Goal: Knowledge of the prescribed therapeutic regimen will improve Outcome: Progressing Goal: Understanding of discharge needs will improve Outcome: Progressing   Problem: Activity: Goal: Ability to avoid complications of mobility impairment will improve Outcome: Progressing Goal: Ability to tolerate increased activity will improve Outcome: Progressing

## 2021-02-17 NOTE — Evaluation (Signed)
Physical Therapy Evaluation and Discharge Patient Details Name: Daryl Shelton MRN: [id number] DOB: Nov 11, 1960 Today's Date: 02/17/2021   History of Present Illness  Pt is a 60 y/o male who presents s/p L2-S1 open laminectomy on 02/16/2021.  PMH includes: Bell's palsy, HIV, Hypertension.  Clinical Impression  Patient evaluated by Physical Therapy with no further acute PT needs identified. All education has been completed and the patient has no further questions. Pt was able to demonstrate transfers and ambulation with gross modified independence to independence. Pt was educated on precautions, appropriate activity progression, and car transfer. See below for any follow-up Physical Therapy or equipment needs. PT is signing off. Thank you for this referral.     Follow Up Recommendations No PT follow up;Supervision - Intermittent    Equipment Recommendations  None recommended by PT    Recommendations for Other Services       Precautions / Restrictions Precautions Precautions: Back Precaution Booklet Issued: Yes (comment) Precaution Comments: Reviewed handout and pt was cued for precautions during functional mobility. Restrictions Weight Bearing Restrictions: No      Mobility  Bed Mobility     General bed mobility comments: Pt was received sitting up EOB. Verbally reviewed log roll and pt reports feeling confident in performing.    Transfers Overall transfer level: Independent Equipment used: None    Ambulation/Gait Ambulation/Gait assistance: Modified independent (Device/Increase time) Gait Distance (Feet): 300 Feet Assistive device: None Gait Pattern/deviations: Step-through pattern;Decreased stride length;Wide base of support Gait velocity: Decreased Gait velocity interpretation: <1.31 ft/sec, indicative of household ambulator General Gait Details: VC's for optimal posture. Pt able to make corrective changes but over time reverts to flexed trunk with forward head  posture.  Stairs Stairs: Yes Stairs assistance: Modified independent (Device/Increase time) Stair Management: One rail Right;Alternating pattern;Forwards Number of Stairs: 10 General stair comments: VC's for optimal safety. Good sequencing and posture noted throughout stair training.  Wheelchair Mobility    Modified Rankin (Stroke Patients Only)       Balance Overall balance assessment: No apparent balance deficits (not formally assessed)                                           Pertinent Vitals/Pain Pain Assessment: Faces Faces Pain Scale: Hurts little more Pain Location: back Pain Descriptors / Indicators: Operative site guarding Pain Intervention(s): Limited activity within patient's tolerance;Monitored during session;Repositioned    Home Living Family/patient expects to be discharged to:: Private residence Living Arrangements: Spouse/significant other;Other (Comment) (Sister) Available Help at Discharge: Family;Available 24 hours/day Type of Home: House Home Access: Stairs to enter Entrance Stairs-Rails: Right Entrance Stairs-Number of Steps: 6 Home Layout: One level Home Equipment: None      Prior Function Level of Independence: Independent               Hand Dominance   Dominant Hand: Right    Extremity/Trunk Assessment   Upper Extremity Assessment Upper Extremity Assessment: Defer to OT evaluation    Lower Extremity Assessment Lower Extremity Assessment: Generalized weakness (Consistent with pre-op diagnosis)    Cervical / Trunk Assessment Cervical / Trunk Assessment: Other exceptions Cervical / Trunk Exceptions: spinal surgery  Communication   Communication: No difficulties  Cognition Arousal/Alertness: Awake/alert Behavior During Therapy: WFL for tasks assessed/performed Overall Cognitive Status: Within Functional Limits for tasks assessed  General Comments       Exercises     Assessment/Plan    PT Assessment Patent does not need any further PT services  PT Problem List         PT Treatment Interventions      PT Goals (Current goals can be found in the Care Plan section)  Acute Rehab PT Goals Patient Stated Goal: Get back to my garden PT Goal Formulation: All assessment and education complete, DC therapy    Frequency     Barriers to discharge        Co-evaluation               AM-PAC PT "6 Clicks" Mobility  Outcome Measure Help needed turning from your back to your side while in a flat bed without using bedrails?: None Help needed moving from lying on your back to sitting on the side of a flat bed without using bedrails?: None Help needed moving to and from a bed to a chair (including a wheelchair)?: None Help needed standing up from a chair using your arms (e.g., wheelchair or bedside chair)?: None Help needed to walk in hospital room?: None Help needed climbing 3-5 steps with a railing? : None 6 Click Score: 24    End of Session Equipment Utilized During Treatment: Gait belt Activity Tolerance: Patient tolerated treatment well Patient left: with call bell/phone within reach (Sitting EOB) Nurse Communication: Mobility status PT Visit Diagnosis: Unsteadiness on feet (R26.81)    Time: 2174-7159 PT Time Calculation (min) (ACUTE ONLY): 13 min   Charges:   PT Evaluation $PT Eval Low Complexity: [place], PT, DPT Acute Rehabilitation Services Pager: [phone] Office: [phone]   Thelma Comp 02/17/2021, 1:22 PM

## 2021-02-17 NOTE — Progress Notes (Signed)
Subjective: Patient reports that " I am terrific."  He reports that he is ambulated 3 times in the hall with nursing staff and has had a resolution of his bilateral lower extremity numbness and pain.  He is pleased with his postoperative status.  No acute events overnight.  He reports minimal low back incisional pain that is appropriate.   Objective: Vital signs in last 24 hours: Temp:  [97.7 F (36.5 C)-98.3 F (36.8 C)] 97.7 F (36.5 C) (07/13 0331) Pulse Rate:  [66-87] 66 (07/13 0331) Resp:  [13-20] 18 (07/13 0331) BP: (102-134)/(62-77) 134/77 (07/13 0331) SpO2:  [97 %-100 %] 98 % (07/13 0331) FiO2 (%):  [20 %-21 %] 20 % (07/12 1926)  Intake/Output from previous day: 07/12 0701 - 07/13 0700 In: 1600 [I.V.:1000; IV Piggyback:600] Out: 1555 [Urine:675; Drains:380; Blood:500] Intake/Output this shift: No intake/output data recorded.  Physical Exam: Patient is awake, A/O X 4, conversant, and in good spirits. Speech is fluent and appropriate. Doing well. MAEW with good strength that is symmetric bilaterally. 5/5 BUE/BLE.   Dressing is clean dry intact. Incision is well approximated with no drainage, erythema, or edema.  Drain with approximately 250 ml of output overnight.   Lab Results: No results for input(s): WBC, HGB, HCT, PLT in the last 72 hours. BMET No results for input(s): NA, K, CL, CO2, GLUCOSE, BUN, CREATININE, CALCIUM in the last 72 hours.  Studies/Results: DG Lumbar Spine 2-3 Views  Result Date: 02/16/2021 CLINICAL DATA:  Provided history: Surgery, elective. Additional history provided: Opening shots for lumbar 2-S1 laminectomies. Provided fluoroscopy time 11 seconds (a 0.79 mGy). EXAM: LUMBAR SPINE - 2-3 VIEW; DG C-ARM 1-60 MIN COMPARISON:  Lumbar spine MRI 01/07/2021. FINDINGS: Four intraoperative lateral view fluoroscopic images of the lumbosacral spine are submitted. The lowest well-formed intervertebral disc space is designated L5-S1. A rudimentary S1-S2 interspace  is noted on the prior lumbar spine MRI of 01/07/2021. On the initial provided image, a metallic site markers project posterior to the lumbar spine at the L1-L2 and L3-L4 levels. On the subsequent radiograph acquired at 8:20 a.m., surgical instruments project posterior to the lumbar spine at the L5-S1 level. On the subsequent radiograph acquired at 8:21 a.m., surgical instruments project posterior to the lumbar spine at the L3-L4 and L5-S1 levels. On the subsequent radiograph acquired at 8:22 a.m., surgical instruments project posterior to the lumbar spine at the L3-L4 and L5-S1 levels. IMPRESSION: Four intraoperative lateral view fluoroscopic images of the lumbosacral spine for localization purposes, as described. Electronically Signed   By: Kellie Simmering DO   On: 02/16/2021 12:07   DG C-Arm 1-60 Min  Result Date: 02/16/2021 CLINICAL DATA:  Provided history: Surgery, elective. Additional history provided: Opening shots for lumbar 2-S1 laminectomies. Provided fluoroscopy time 11 seconds (a 0.79 mGy). EXAM: LUMBAR SPINE - 2-3 VIEW; DG C-ARM 1-60 MIN COMPARISON:  Lumbar spine MRI 01/07/2021. FINDINGS: Four intraoperative lateral view fluoroscopic images of the lumbosacral spine are submitted. The lowest well-formed intervertebral disc space is designated L5-S1. A rudimentary S1-S2 interspace is noted on the prior lumbar spine MRI of 01/07/2021. On the initial provided image, a metallic site markers project posterior to the lumbar spine at the L1-L2 and L3-L4 levels. On the subsequent radiograph acquired at 8:20 a.m., surgical instruments project posterior to the lumbar spine at the L5-S1 level. On the subsequent radiograph acquired at 8:21 a.m., surgical instruments project posterior to the lumbar spine at the L3-L4 and L5-S1 levels. On the subsequent radiograph acquired at 8:22 a.m.,  surgical instruments project posterior to the lumbar spine at the L3-L4 and L5-S1 levels. IMPRESSION: Four intraoperative lateral  view fluoroscopic images of the lumbosacral spine for localization purposes, as described. Electronically Signed   By: Kellie Simmering DO   On: 02/16/2021 12:07    Assessment/Plan: Patient is post-op day 1 s/p bilateral L2, L3, L4, L5, S1 laminectomies for decompression of neural elements with partial bilateral L2-3, L3-4, L4-5, L5-S1 medial facetectomies. He is recovering well and reports a resolution of his preoperative symptoms.  His only complaint is mild incisional discomfort. His drain is continuing to put outr a moderate amount of sanguinous fluid. He has ambulated with nursing staff multiple times in the hallway and is awaiting PT/OT evaluation.  Continue LSO brace when OOB. Continue working on pain control, mobility and ambulating patient. Will leave drain in for today and plan for discharge tomorrow.    LOS: 1 day     Marvis Moeller, DNP, NP-C 02/17/2021, 8:12 AM

## 2021-02-18 DIAGNOSIS — F4322 Adjustment disorder with anxiety: Secondary | ICD-10-CM | POA: Diagnosis not present

## 2021-02-18 DIAGNOSIS — Z79899 Other long term (current) drug therapy: Secondary | ICD-10-CM | POA: Diagnosis not present

## 2021-02-18 DIAGNOSIS — F17210 Nicotine dependence, cigarettes, uncomplicated: Secondary | ICD-10-CM | POA: Diagnosis not present

## 2021-02-18 DIAGNOSIS — M48062 Spinal stenosis, lumbar region with neurogenic claudication: Secondary | ICD-10-CM | POA: Diagnosis not present

## 2021-02-18 DIAGNOSIS — I10 Essential (primary) hypertension: Secondary | ICD-10-CM | POA: Diagnosis not present

## 2021-02-18 MED ORDER — HYDROCODONE-ACETAMINOPHEN 5-325 MG PO TABS: 1.0000 | ORAL_TABLET | ORAL | 0 refills | Status: DC | PRN

## 2021-02-18 MED ORDER — METHOCARBAMOL 500 MG PO TABS: 500.0000 mg | ORAL_TABLET | Freq: Four times a day (QID) | ORAL | 0 refills | Status: DC

## 2021-02-18 NOTE — Plan of Care (Signed)
  Problem: Safety: Goal: Ability to remain free from injury will improve Outcome: Adequate for Discharge   Problem: Activity: Goal: Ability to avoid complications of mobility impairment will improve Outcome: Adequate for Discharge Goal: Ability to tolerate increased activity will improve Outcome: Adequate for Discharge Goal: Will remain free from falls Outcome: Adequate for Discharge

## 2021-02-18 NOTE — Progress Notes (Signed)
Occupational Therapy Treatment Patient Details Name: Daryl Shelton MRN: [id number] DOB: January 15, 1961 Today's Date: 02/18/2021    History of present illness Pt is a 60 y/o male who presents s/p L2-S1 open laminectomy on 02/16/2021.  PMH includes: Bell's palsy, HIV, Hypertension.   OT comments  Patient with good understanding of all back precautions.  Able to demonstrate good follow through during ADL task.  Patient's only complaint is increased pain.  He is ready fo discharge, and will have assist as needed.  All questions answered and no further acute OT needs.    Follow Up Recommendations  No OT follow up    Equipment Recommendations  Tub/shower seat    Recommendations for Other Services      Precautions / Restrictions Precautions Precautions: Back Precaution Booklet Issued: Yes (comment) Precaution Comments: Reviewed handout Restrictions Weight Bearing Restrictions: No       Mobility Bed Mobility Overal bed mobility: Modified Independent               Patient Response: Cooperative  Transfers Overall transfer level: Modified independent                    Balance Overall balance assessment: No apparent balance deficits (not formally assessed)                                         ADL either performed or assessed with clinical judgement   ADL Overall ADL's : Modified independent                                                                                                                                 Pertinent Vitals/ Pain       Faces Pain Scale: Hurts even more Pain Location: back Pain Descriptors / Indicators: Operative site guarding Pain Intervention(s): Monitored during session                                                          Frequency  Min 2X/week        Progress Toward Goals  OT Goals(current goals can now be found in  the care plan section)  Progress towards OT goals: Progressing toward goals  Acute Rehab OT Goals Patient Stated Goal: Get back to my garden OT Goal Formulation: With patient Time For Goal Achievement: 03/03/21 Potential to Achieve Goals: Good  Plan Discharge plan remains appropriate    Co-evaluation                 AM-PAC OT "6 Clicks" Daily Activity     Outcome Measure   Help from another person eating meals?: None Help from another  person taking care of personal grooming?: None Help from another person toileting, which includes using toliet, bedpan, or urinal?: None Help from another person bathing (including washing, rinsing, drying)?: None Help from another person to put on and taking off regular upper body clothing?: None Help from another person to put on and taking off regular lower body clothing?: None 6 Click Score: 24    End of Session    OT Visit Diagnosis: Pain   Activity Tolerance Patient tolerated treatment well   Patient Left in bed;with call bell/phone within reach   Nurse Communication Mobility status        Time: 5956-3875 OT Time Calculation (min): 16 min  Charges: OT General Charges $OT Visit: 1 Visit OT Treatments $Self Care/Home Management : 8-22 mins  02/18/2021  [name], OTR/L  Acute Rehabilitation Services  Office:  [phone]    [name] 02/18/2021, 8:50 AM

## 2021-02-18 NOTE — Discharge Summary (Signed)
Physician Discharge Summary  Patient ID: Daryl Shelton MRN: [id number] DOB/AGE: 1961/04/21 60 y.o.  Admit date: 02/16/2021 Discharge date: 02/18/2021  Admission Diagnoses: L2-3, L3-4, L4-5, L5-S1 lumbar stenosis with neurogenic claudication  Discharge Diagnoses: L2-3, L3-4, L4-5, L5-S1 lumbar stenosis with neurogenic claudication Active Problems:   Lumbar stenosis with neurogenic claudication   Discharged Condition: good  Hospital Course: The patient was admitted on 02/16/2021 and taken to the operating room where the patient underwent bilateral L2, L3, L4, L5, S1 laminectomies for decompression of neural elements with partial bilateral L2-3, L3-4, L4-5, L5-S1 medial facetectomies. The patient tolerated the procedure well and was taken to the recovery room and then to the floor in stable condition. The hospital course was routine. There were no complications. The wound remained clean dry and intact. Pt had appropriate back soreness. No complaints of leg pain or new N/T/W. The patient remained afebrile with stable vital signs, and tolerated a regular diet. The patient continued to increase activities, and pain was well controlled with oral pain medications.   Consults: None  Significant Diagnostic Studies: radiology: X-Ray: intraoperative   Treatments: surgery: Bilateral L2, L3, L4, L5, S1 laminectomies for decompression of neural elements with partial bilateral L2-3, L3-4, L4-5, L5-S1 medial facetectomies  Discharge Exam: Blood pressure (!) 92/58, pulse 72, temperature 98.3 F (36.8 C), temperature source Oral, resp. rate 20, height 5\' 10"  (1.778 m), weight 106.6 kg, SpO2 96 %.  Physical Exam: Patient is awake, A/O X 4, conversant, and in good spirits. Speech is fluent and appropriate. Doing well. MAEW with good strength that is symmetric bilaterally. 5/5 BUE/BLE.   Dressing is clean dry intact. Incision is well approximated with no drainage, erythema, or edema.     Disposition:  Discharge disposition: 01-Home or Self Care       Discharge Instructions     Incentive spirometry RT   Complete by: As directed       Allergies as of 02/18/2021       Reactions   Varenicline Other (See Comments)   CNS symptoms - anxiety, agitation   Pollen Extract Other (See Comments)   Congestion        Medication List     TAKE these medications    atorvastatin 20 MG tablet Commonly known as: LIPITOR Take 20 mg by mouth daily.   chlorhexidine 0.12 % solution Commonly known as: PERIDEX Use as directed 15 mLs in the mouth or throat daily.   gabapentin 600 MG tablet Commonly known as: NEURONTIN Take 900 mg by mouth 3 (three) times daily.   HYDROcodone-acetaminophen 5-325 MG tablet Commonly known as: NORCO/VICODIN Take 1-2 tablets by mouth every 4 (four) hours as needed for moderate pain.   ketoconazole 2 % cream Commonly known as: NIZORAL Apply 1 application topically daily.   lisinopril 40 MG tablet Commonly known as: ZESTRIL Take 40 mg by mouth daily.   methocarbamol 500 MG tablet Commonly known as: Robaxin Take 1 tablet (500 mg total) by mouth 4 (four) times daily.   mirtazapine 30 MG tablet Commonly known as: REMERON Take 30 mg by mouth at bedtime.   Multivitamin Adults 50+ Tabs Take 1 tablet by mouth daily.   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   sertraline 100 MG tablet Commonly known as: ZOLOFT Take 200 mg by mouth daily.   terbinafine 250 MG tablet Commonly known as: LAMISIL Take 1  tablet (250 mg total) by mouth daily.       ASK your doctor about these medications    Biktarvy 50-200-25 MG Tabs tablet Generic drug: bictegravir-emtricitabine-tenofovir AF TAKE 1 TABLET BY MOUTH DAILY   hydrochlorothiazide 25 MG tablet Commonly known as: HYDRODIURIL TAKE 1 TABLET(25 MG) BY MOUTH DAILY   tadalafil 10 MG tablet Commonly  known as: CIALIS Take 1 tablet (10 mg total) by mouth every other day as needed for erectile dysfunction.         Signed: Marvis Moeller, DNP, NP-C 02/18/2021, 8:23 AM

## 2021-02-18 NOTE — Progress Notes (Signed)
NURSING PROGRESS NOTE  Daryl Shelton [id number] Discharge Data: 02/18/2021 12:09 PM Attending Provider: Karsten Ro, DO XMI:WOEHOZ, Colin Rhein, [place] to be D/C'd Home per MD order.  Discussed with the patient the After Visit Summary and all questions fully answered. All IV's discontinued with no bleeding noted. All belongings returned to patient for patient to take home.   Last Vital Signs:  Blood pressure (!) 92/58, pulse 72, temperature 98.3 F (36.8 C), temperature source Oral, resp. rate 20, height 5\' 10"  (1.778 m), weight 106.6 kg, SpO2 96 %.  Discharge Medication List Allergies as of 02/18/2021       Reactions   Varenicline Other (See Comments)   CNS symptoms - anxiety, agitation   Pollen Extract Other (See Comments)   Congestion        Medication List     TAKE these medications    atorvastatin 20 MG tablet Commonly known as: LIPITOR Take 20 mg by mouth daily.   chlorhexidine 0.12 % solution Commonly known as: PERIDEX Use as directed 15 mLs in the mouth or throat daily.   gabapentin 600 MG tablet Commonly known as: NEURONTIN Take 900 mg by mouth 3 (three) times daily.   HYDROcodone-acetaminophen 5-325 MG tablet Commonly known as: NORCO/VICODIN Take 1-2 tablets by mouth every 4 (four) hours as needed for moderate pain.   ketoconazole 2 % cream Commonly known as: NIZORAL Apply 1 application topically daily.   lisinopril 40 MG tablet Commonly known as: ZESTRIL Take 40 mg by mouth daily.   methocarbamol 500 MG tablet Commonly known as: Robaxin Take 1 tablet (500 mg total) by mouth 4 (four) times daily.   mirtazapine 30 MG tablet Commonly known as: REMERON Take 30 mg by mouth at bedtime.   Multivitamin Adults 50+ Tabs Take 1 tablet by mouth daily.   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   sertraline 100 MG  tablet Commonly known as: ZOLOFT Take 200 mg by mouth daily.   terbinafine 250 MG tablet Commonly known as: LAMISIL Take 1 tablet (250 mg total) by mouth daily.       ASK your doctor about these medications    Biktarvy 50-200-25 MG Tabs tablet Generic drug: bictegravir-emtricitabine-tenofovir AF TAKE 1 TABLET BY MOUTH DAILY   hydrochlorothiazide 25 MG tablet Commonly known as: HYDRODIURIL TAKE 1 TABLET(25 MG) BY MOUTH DAILY   tadalafil 10 MG tablet Commonly known as: CIALIS Take 1 tablet (10 mg total) by mouth every other day as needed for erectile dysfunction.

## 2021-02-18 NOTE — Progress Notes (Signed)
Subjective: Patient reports that he is having a moderate amount of appropriate incisional pain. He has no other complaints. No acute events overnight.    Objective: Vital signs in last 24 hours: Temp:  [97.7 F (36.5 C)-98.4 F (36.9 C)] 98.3 F (36.8 C) (07/14 0704) Pulse Rate:  [71-79] 72 (07/14 0704) Resp:  [18-20] 20 (07/14 0704) BP: (91-101)/(54-70) 92/58 (07/14 0704) SpO2:  [96 %-100 %] 96 % (07/14 0704)  Intake/Output from previous day: 07/13 0701 - 07/14 0700 In: -  Out: 100 [Drains:100] Intake/Output this shift: No intake/output data recorded.  Physical Exam: Patient is awake, A/O X 4, conversant, and in good spirits. Speech is fluent and appropriate. Doing well. MAEW with good strength that is symmetric bilaterally. 5/5 BUE/BLE.   Dressing is clean dry intact. Incision is well approximated with no drainage, erythema, or edema.  Drain has been removed   Lab Results: No results for input(s): WBC, HGB, HCT, PLT in the last 72 hours. BMET No results for input(s): NA, K, CL, CO2, GLUCOSE, BUN, CREATININE, CALCIUM in the last 72 hours.  Studies/Results: DG Lumbar Spine 2-3 Views  Result Date: 02/16/2021 CLINICAL DATA:  Provided history: Surgery, elective. Additional history provided: Opening shots for lumbar 2-S1 laminectomies. Provided fluoroscopy time 11 seconds (a 0.79 mGy). EXAM: LUMBAR SPINE - 2-3 VIEW; DG C-ARM 1-60 MIN COMPARISON:  Lumbar spine MRI 01/07/2021. FINDINGS: Four intraoperative lateral view fluoroscopic images of the lumbosacral spine are submitted. The lowest well-formed intervertebral disc space is designated L5-S1. A rudimentary S1-S2 interspace is noted on the prior lumbar spine MRI of 01/07/2021. On the initial provided image, a metallic site markers project posterior to the lumbar spine at the L1-L2 and L3-L4 levels. On the subsequent radiograph acquired at 8:20 a.m., surgical instruments project posterior to the lumbar spine at the L5-S1 level. On the  subsequent radiograph acquired at 8:21 a.m., surgical instruments project posterior to the lumbar spine at the L3-L4 and L5-S1 levels. On the subsequent radiograph acquired at 8:22 a.m., surgical instruments project posterior to the lumbar spine at the L3-L4 and L5-S1 levels. IMPRESSION: Four intraoperative lateral view fluoroscopic images of the lumbosacral spine for localization purposes, as described. Electronically Signed   By: Kellie Simmering DO   On: 02/16/2021 12:07   DG C-Arm 1-60 Min  Result Date: 02/16/2021 CLINICAL DATA:  Provided history: Surgery, elective. Additional history provided: Opening shots for lumbar 2-S1 laminectomies. Provided fluoroscopy time 11 seconds (a 0.79 mGy). EXAM: LUMBAR SPINE - 2-3 VIEW; DG C-ARM 1-60 MIN COMPARISON:  Lumbar spine MRI 01/07/2021. FINDINGS: Four intraoperative lateral view fluoroscopic images of the lumbosacral spine are submitted. The lowest well-formed intervertebral disc space is designated L5-S1. A rudimentary S1-S2 interspace is noted on the prior lumbar spine MRI of 01/07/2021. On the initial provided image, a metallic site markers project posterior to the lumbar spine at the L1-L2 and L3-L4 levels. On the subsequent radiograph acquired at 8:20 a.m., surgical instruments project posterior to the lumbar spine at the L5-S1 level. On the subsequent radiograph acquired at 8:21 a.m., surgical instruments project posterior to the lumbar spine at the L3-L4 and L5-S1 levels. On the subsequent radiograph acquired at 8:22 a.m., surgical instruments project posterior to the lumbar spine at the L3-L4 and L5-S1 levels. IMPRESSION: Four intraoperative lateral view fluoroscopic images of the lumbosacral spine for localization purposes, as described. Electronically Signed   By: Kellie Simmering DO   On: 02/16/2021 12:07    Assessment/Plan: Patient is post-op day 2 s/p  bilateral L2, L3, L4, L5, S1 laminectomies for decompression of neural elements with partial bilateral L2-3,  L3-4, L4-5, L5-S1 medial facetectomies. He is continuing to recover well and has had a resolution of his preoperative symptoms. He has mild incisional discomfort. His drain has been removed. He has worked with PT/OT and they are recommending no follow up needed. Continue LSO brace when OOB. Continue working on pain control, mobility and ambulating patient. Will discharge today.    LOS: 1 day     Marvis Moeller, DNP, NP-C 02/18/2021, 8:10 AM

## 2021-02-22 ENCOUNTER — Encounter (HOSPITAL_COMMUNITY): Payer: Self-pay | Admitting: Neurological Surgery

## 2021-02-22 DIAGNOSIS — Z79899 Other long term (current) drug therapy: Secondary | ICD-10-CM | POA: Diagnosis not present

## 2021-02-22 DIAGNOSIS — M48062 Spinal stenosis, lumbar region with neurogenic claudication: Secondary | ICD-10-CM | POA: Diagnosis not present

## 2021-02-22 DIAGNOSIS — F17210 Nicotine dependence, cigarettes, uncomplicated: Secondary | ICD-10-CM | POA: Diagnosis not present

## 2021-02-22 DIAGNOSIS — I10 Essential (primary) hypertension: Secondary | ICD-10-CM | POA: Diagnosis not present

## 2021-02-22 MED ORDER — MICROFIBRILLAR COLL HEMOSTAT EX POWD
CUTANEOUS | Status: DC | PRN
  Administered 2021-02-22: 1 g via TOPICAL

## 2021-02-22 NOTE — Unmapped (Signed)
Patient is now receiving care at [facility] facility for HIV management and will not be routinely followed by the ID clinic at [facility]    Duration: 3 minutes    Randa Ngo Counselor  [facility]

## 2021-02-22 NOTE — OR Nursing (Signed)
Documented a change in medication supply charge

## 2021-02-26 DIAGNOSIS — F4322 Adjustment disorder with anxiety: Secondary | ICD-10-CM | POA: Diagnosis not present

## 2021-03-02 ENCOUNTER — Ambulatory Visit: Payer: BC Managed Care – PPO | Attending: Infectious Diseases | Admitting: Infectious Diseases

## 2021-03-02 ENCOUNTER — Other Ambulatory Visit
Admission: RE | Admit: 2021-03-02 | Discharge: 2021-03-02 | Disposition: A | Discharge status: Home / Self Care | Payer: BC Managed Care – PPO | Source: Ambulatory Visit | Attending: Infectious Diseases | Admitting: Infectious Diseases

## 2021-03-02 ENCOUNTER — Other Ambulatory Visit: Payer: Self-pay

## 2021-03-02 ENCOUNTER — Ambulatory Visit: Payer: BC Managed Care – PPO | Admitting: Infectious Diseases

## 2021-03-02 VITALS — BP 115/69 | HR 89 | Resp 16 | Ht 70.0 in | Wt 235.5 lb

## 2021-03-02 DIAGNOSIS — B20 Human immunodeficiency virus [HIV] disease: Secondary | ICD-10-CM | POA: Insufficient documentation

## 2021-03-02 DIAGNOSIS — F32A Depression, unspecified: Secondary | ICD-10-CM | POA: Diagnosis not present

## 2021-03-02 DIAGNOSIS — F419 Anxiety disorder, unspecified: Secondary | ICD-10-CM | POA: Insufficient documentation

## 2021-03-02 DIAGNOSIS — L821 Other seborrheic keratosis: Secondary | ICD-10-CM | POA: Insufficient documentation

## 2021-03-02 DIAGNOSIS — Z79899 Other long term (current) drug therapy: Secondary | ICD-10-CM | POA: Diagnosis not present

## 2021-03-02 DIAGNOSIS — G629 Polyneuropathy, unspecified: Secondary | ICD-10-CM | POA: Diagnosis not present

## 2021-03-02 DIAGNOSIS — I10 Essential (primary) hypertension: Secondary | ICD-10-CM | POA: Diagnosis not present

## 2021-03-02 DIAGNOSIS — F17210 Nicotine dependence, cigarettes, uncomplicated: Secondary | ICD-10-CM | POA: Insufficient documentation

## 2021-03-02 DIAGNOSIS — E785 Hyperlipidemia, unspecified: Secondary | ICD-10-CM | POA: Diagnosis not present

## 2021-03-02 LAB — CHLAMYDIA/NGC RT PCR (ARMC ONLY)
Chlamydia Tr: NOT DETECTED
N gonorrhoeae: NOT DETECTED

## 2021-03-02 MED ORDER — BIKTARVY 50-200-25 MG PO TABS: 1.0000 | ORAL_TABLET | Freq: Every day | ORAL | 6 refills | Status: DC

## 2021-03-02 NOTE — Patient Instructions (Signed)
You are here for follow up. Will see you next week Thursday for follow up pap

## 2021-03-02 NOTE — Progress Notes (Signed)
NAME: Daryl Shelton  DOB: [birth year]  MRN: [id number]  Date/Time: 03/02/2021 2:24 PM  ?Pt here for routine STD screening and screening anal pap Since his last visit on 01/28/21 he has undergone lumbar laminectomies   The following copied from last note Daryl Shelton is a 60 y.o. 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 Pt was  Was at [facility], until 2021 On Biktarvy - 100% adherent last cd4 on 02/04/20 was 588(49%) and Vl < 20  Nadir Cd4 unknown-as far as I can see [facility] records- 08/19/10- 629 OI -none  HAARt history Genvoya Dont know the medicine he took before Acquired thru sex with men  [facility] Pharm[facility] ? 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    Family History  Problem Relation Age of Onset   CAD Father   Twin brother -HIV 5 sisters- 3 of 5 opioid dependence due to back pain   Allergies  Allergen Reactions   Varenicline Other (See Comments)    CNS symptoms - anxiety, agitation     Pollen Extract Other (See Comments)    Congestion    ? Current Outpatient Medications  Medication Sig Dispense Refill   atorvastatin (LIPITOR) 20 MG tablet Take by mouth.     BIKTARVY 50-200-25 MG TABS tablet TAKE 1 TABLET BY MOUTH DAILY 90 tablet 1   chlorhexidine (PERIDEX) 0.12 % solution 15 mLs 2 (two) times daily.     gabapentin  (NEURONTIN) 600 MG tablet Take 1 tablet by mouth 3 (three) times daily.     hydrochlorothiazide (HYDRODIURIL) 25 MG tablet TAKE 1 TABLET(25 MG) BY MOUTH DAILY 90 tablet 3   ketoconazole (NIZORAL) 2 % cream Apply 1 application topically daily. 60 g 2   lisinopril (ZESTRIL) 40 MG tablet Take by mouth.     mirtazapine (REMERON) 30 MG tablet Take by mouth.     nicotine (NICODERM CQ - DOSED IN MG/24 HOURS) 21 mg/24hr patch Place onto the skin.     pravastatin (PRAVACHOL) 40 MG tablet Take 40 mg by mouth daily.     sertraline (ZOLOFT) 100 MG tablet Take 200 mg by mouth daily.     tadalafil (CIALIS) 10 MG tablet Take 1 tablet (10 mg total) by mouth every other day as needed for erectile dysfunction. 10 tablet 1   terbinafine (LAMISIL) 250 MG tablet Take 1 tablet (250 mg total) by mouth daily. 90 tablet 0   Naprosyn MVT   REVIEW OF SYSTEMS:  Const: negative fever, negative chills, some weight loss Eyes: negative diplopia or visual changes, negative eye pain ENT: negative coryza, negative sore throat Resp: negative cough, hemoptysis, dyspnea Cards: negative for chest pain, palpitations, lower extremity edema GU: negative for frequency, dysuria and hematuria Skin: negative for rash and pruritus Heme: negative for easy bruising  and gum/nose bleeding MS: negative for myalgias, arthralgias, back pain and muscle weakness Neurolo:numbness feet much improved Psych:  anxiety  Objective:  VITALS:  BP 115/69   Pulse 89   Resp 16   Ht '5\' 10"'$  (1.778 m)   Wt 235 lb 8 oz (106.8 kg)   SpO2 98%   BMI 33.79 kg/m  PHYSICAL EXAM:  General: Alert, cooperative, no distress, appears stated age.  Head: Normocephalic, without obvious abnormality, atraumatic. Eyes: Conjunctivae clear, anicteric sclerae. Pupils are equal Nose: Nares normal. No drainage or sinus tenderness. Throat: Lips, mucosa, and tongue normal. No Thrush Neck: Supple, symmetrical, no adenopathy, thyroid: non tender no carotid bruit and  no JVD. Back: No CVA tenderness. Lungs: Clear to auscultation bilaterally. No Wheezing or Rhonchi. No rales. Heart: Regular rate and rhythm, no murmur, rub or gallop. Abdomen: Soft, non-tender,not distended. Bowel sounds normal. No masses Extremities: Extremities normal, atraumatic, no cyanosis. No edema. No clubbing Skin: No rashes or lesions. Not Jaundiced Lymph: Cervical, supraclavicular normal. Neurologic:numbness feet,  Health maintenance Vaccination Immunizations   Immunizations Name Administration Dates Next Due  COVID-19 VACCINE,MRNA(MODERNA)(PF)(IM) 11/27/2019, 10/28/2019    Hepatitis A 12/29/2010, 04/21/2010    Hepatitis B, Adult 05/10/2017, 04/21/2010, 11/04/2009, 07/08/2009    INFLUENZA INJ MDCK PF, QUAD,(FLUCELVAX)(63MO AND UP EGG FREE) 05/11/2020    INFLUENZA TIV (TRI) PF (IM) 06/23/2011, 04/21/2010, 07/08/2009    Influenza Vaccine Quad (IIV4 PF) 43mo injectable 05/02/2019, 05/07/2018, 05/10/2017, 05/05/2016, 10/01/2015, 08/28/2014, 05/02/2013, 06/28/2012    PNEUMOCOCCAL POLYSACCHARIDE 23 02/26/2015, 02/19/2009    PPD Test 12/29/2010, 11/04/2009    Pneumococcal Conjugate 13-Valent 03/27/2014    SHINGRIX-ZOSTER VACCINE (HZV), RECOMBINANT,SUB-UNIT,ADJUVANTED IM 02/04/2020, 08/06/2019    TdaP 02/19/2009    Tetanus-Diptheria Toxoids-TD(TDVAX),Asdorbed,2LF(IM) 08/06/2019      ______________________  Labs Lab Result  Date comment  HIV VL     CD4     Genotype     [id number]    HIV antibody     RPR     Quantiferon Gold     Hep C ab     Hepatitis B-ab,ag,c     Hepatitis A-IgM, IgG /T     Lipid     GC/CHL     PAP     HB,PLT,Cr, LFT       Preventive  Procedure Result  Date comment  colonoscopy     Mammogram     Dental exam     Opthal       Impression/Recommendation Here for anal PAP and STD screen Unable to do anal pap and rectal GC/Chl because of stool in rectum Appt next week ? ?HIV- - on Biktarvy- well controlled- Vl < 20 on 01/28/21 And cd4 is  927  HTN on lisinopril and HCTZ  Hyperlipidemia on pravachol  Anxiety/depression- zoloft, remeron  Spinal steosis- underwent laminectomies on 02/16/21- feeling much better  Peripheral neuropathy Current smoker  Seborrheic warts scalp/trunk- refer to Dermatology Dr.Moya  ? ___________________________________________________ Discussed with patient in detail.

## 2021-03-04 DIAGNOSIS — F4322 Adjustment disorder with anxiety: Secondary | ICD-10-CM | POA: Diagnosis not present

## 2021-03-09 DIAGNOSIS — M5412 Radiculopathy, cervical region: Secondary | ICD-10-CM | POA: Diagnosis not present

## 2021-03-09 DIAGNOSIS — M542 Cervicalgia: Secondary | ICD-10-CM | POA: Diagnosis not present

## 2021-03-09 DIAGNOSIS — M961 Postlaminectomy syndrome, not elsewhere classified: Secondary | ICD-10-CM | POA: Diagnosis not present

## 2021-03-09 DIAGNOSIS — M5459 Other low back pain: Secondary | ICD-10-CM | POA: Diagnosis not present

## 2021-03-10 DIAGNOSIS — F4322 Adjustment disorder with anxiety: Secondary | ICD-10-CM | POA: Diagnosis not present

## 2021-03-11 ENCOUNTER — Other Ambulatory Visit
Admission: RE | Admit: 2021-03-11 | Discharge: 2021-03-11 | Disposition: A | Discharge status: Home / Self Care | Payer: BC Managed Care – PPO | Source: Ambulatory Visit | Attending: Infectious Diseases | Admitting: Infectious Diseases

## 2021-03-11 ENCOUNTER — Other Ambulatory Visit: Payer: Self-pay

## 2021-03-11 ENCOUNTER — Encounter: Payer: Self-pay | Admitting: Infectious Diseases

## 2021-03-11 ENCOUNTER — Ambulatory Visit: Payer: BC Managed Care – PPO | Attending: Infectious Diseases | Admitting: Infectious Diseases

## 2021-03-11 ENCOUNTER — Telehealth: Payer: Self-pay | Admitting: Dermatology

## 2021-03-11 VITALS — BP 120/77 | HR 73 | Resp 16 | Ht 70.0 in | Wt 235.5 lb

## 2021-03-11 DIAGNOSIS — Z113 Encounter for screening for infections with a predominantly sexual mode of transmission: Secondary | ICD-10-CM | POA: Diagnosis not present

## 2021-03-11 DIAGNOSIS — B20 Human immunodeficiency virus [HIV] disease: Secondary | ICD-10-CM

## 2021-03-11 LAB — CHLAMYDIA/NGC RT PCR (ARMC ONLY)
Chlamydia Tr: NOT DETECTED
N gonorrhoeae: NOT DETECTED

## 2021-03-11 NOTE — Telephone Encounter (Signed)
Called Daryl Shelton today for Dr Alfonso Patten to schedule an appointment for him with her on August 11,2022 at 12:15 PM. I had to leave a voicemail , no answer. This pt was referred to her by ID.

## 2021-03-11 NOTE — Progress Notes (Signed)
ID Pt with HIV Here for STD screen and anal pap BP 120/77   Pulse 73   Resp 16   Ht '5\' 10"'$  (1.778 m)   Wt 235 lb 8 oz (106.8 kg)   SpO2 98%   BMI 33.79 kg/m   O/e perianal no lesions Anal pap taken GC/Chl sent Follow up 4 months

## 2021-03-17 DIAGNOSIS — F4322 Adjustment disorder with anxiety: Secondary | ICD-10-CM | POA: Diagnosis not present

## 2021-03-18 ENCOUNTER — Ambulatory Visit (INDEPENDENT_AMBULATORY_CARE_PROVIDER_SITE_OTHER): Payer: BC Managed Care – PPO | Admitting: Dermatology

## 2021-03-18 ENCOUNTER — Other Ambulatory Visit: Payer: Self-pay

## 2021-03-18 DIAGNOSIS — B078 Other viral warts: Secondary | ICD-10-CM | POA: Diagnosis not present

## 2021-03-18 DIAGNOSIS — D225 Melanocytic nevi of trunk: Secondary | ICD-10-CM

## 2021-03-18 DIAGNOSIS — L570 Actinic keratosis: Secondary | ICD-10-CM | POA: Diagnosis not present

## 2021-03-18 DIAGNOSIS — L821 Other seborrheic keratosis: Secondary | ICD-10-CM

## 2021-03-18 NOTE — Patient Instructions (Addendum)

## 2021-03-18 NOTE — Progress Notes (Signed)
New Patient Visit  Subjective  [name] is a 60 y.o. male who presents for the following: Skin Problem (Check growth on the right scalp for several years growing ). Pt would like growths on his back, left leg,scalp and arms checked today.   The following portions of the chart were reviewed this encounter and updated as appropriate:   Tobacco  Allergies  Meds  Problems  Med Hx  Surg Hx  Fam Hx      Review of Systems:  No other skin or systemic complaints except as noted in HPI or Assessment and Plan.  Objective  Well appearing patient in no apparent distress; mood and affect are within normal limits.  A focused examination was performed including face,scalp, arms, hands,back. Relevant physical exam findings are noted in the Assessment and Plan.  right parietal scalp Stuck-on, waxy, tan-brown papule or plaque --Discussed benign etiology and prognosis.   right frontal scalp Erythematous thin papules/macules with gritty scale.   right hand x 4, right wrist x 1, left upper arm x 4, left inner thigh x 1 (5) Verrucous papules -- Discussed viral etiology and contagion.    Assessment & Plan  Seborrheic keratosis right parietal scalp  Seborrheic Keratoses - Stuck-on, waxy, tan-brown papules and/or plaques  - Benign-appearing - Discussed benign etiology and prognosis. - Observe - Call for any changes       AK (actinic keratosis) right frontal scalp  Prior to procedure, discussed risks of blister formation, small wound, skin dyspigmentation, or rare scar following cryotherapy. Recommend Vaseline ointment to treated areas while healing.   Destruction of lesion - right frontal scalp Complexity: simple   Destruction method: cryotherapy   Informed consent: discussed and consent obtained   Timeout:  patient name, date of birth, surgical site, and procedure verified Lesion destroyed using liquid nitrogen: Yes   Region frozen until ice ball extended beyond lesion:  Yes   Outcome: patient tolerated procedure well with no complications   Post-procedure details: wound care instructions given    Other viral warts (5) right hand x 4, right wrist x 1, left upper arm x 4, left inner thigh x 1  Discussed viral etiology and risk of spread.  Discussed multiple treatments may be required to clear warts.  Discussed possible post-treatment dyspigmentation and risk of recurrence.   Treated with LN2 at the left upper arm x 4, left inner thigh x 1  Prior to procedure, discussed risks of blister formation, small wound, skin dyspigmentation, or rare scar following cryotherapy. Recommend Vaseline ointment to treated areas while healing.   No treatment at the right hand and right wrist we will recheck in 3 months    Destruction of lesion - right hand x 4, right wrist x 1, left upper arm x 4, left inner thigh x 1  Destruction method: cryotherapy   Informed consent: discussed and consent obtained   Lesion destroyed using liquid nitrogen: Yes   Cryotherapy cycles:  2 Outcome: patient tolerated procedure well with no complications   Post-procedure details: wound care instructions given    Seborrheic Keratoses back - Stuck-on, waxy, tan-brown papules and/or plaques  - Benign-appearing - Discussed benign etiology and prognosis. - Observe - Call for any changes  Melanocytic Nevi back - Tan-brown and/or pink-flesh-colored symmetric macules and papules - Benign appearing on exam today - Observation - Call clinic for new or changing moles - Recommend daily use of broad spectrum spf 30+ sunscreen to sun-exposed areas.  Return in about 3 months (around 06/18/2021) for Aks, warts  .   I, Daryl Shelton, CMA, am acting as scribe for Forest Gleason, MD .   Documentation: I have reviewed the above documentation for accuracy and completeness, and I agree with the above.  Forest Gleason, MD

## 2021-03-26 DIAGNOSIS — F4322 Adjustment disorder with anxiety: Secondary | ICD-10-CM | POA: Diagnosis not present

## 2021-03-27 ENCOUNTER — Encounter: Payer: Self-pay | Admitting: Dermatology

## 2021-03-30 DIAGNOSIS — F4322 Adjustment disorder with anxiety: Secondary | ICD-10-CM | POA: Diagnosis not present

## 2021-04-02 ENCOUNTER — Ambulatory Visit: Payer: BC Managed Care – PPO | Admitting: Family Medicine

## 2021-04-06 DIAGNOSIS — F4322 Adjustment disorder with anxiety: Secondary | ICD-10-CM | POA: Diagnosis not present

## 2021-04-14 DIAGNOSIS — F4322 Adjustment disorder with anxiety: Secondary | ICD-10-CM | POA: Diagnosis not present

## 2021-04-19 DIAGNOSIS — F4322 Adjustment disorder with anxiety: Secondary | ICD-10-CM | POA: Diagnosis not present

## 2021-04-23 DIAGNOSIS — F4322 Adjustment disorder with anxiety: Secondary | ICD-10-CM | POA: Diagnosis not present

## 2021-04-30 DIAGNOSIS — F4322 Adjustment disorder with anxiety: Secondary | ICD-10-CM | POA: Diagnosis not present

## 2021-05-04 ENCOUNTER — Other Ambulatory Visit: Payer: Self-pay | Admitting: Internal Medicine

## 2021-05-11 DIAGNOSIS — F4322 Adjustment disorder with anxiety: Secondary | ICD-10-CM | POA: Diagnosis not present

## 2021-05-11 NOTE — Unmapped (Signed)
Patient did not renew HMAP application before 05/07/2021.    Larry Montes  Benefits Counselor  Time of Intervention: 15 mins

## 2021-05-28 ENCOUNTER — Other Ambulatory Visit: Payer: Self-pay

## 2021-05-28 ENCOUNTER — Encounter: Payer: Self-pay | Admitting: Family Medicine

## 2021-05-28 ENCOUNTER — Ambulatory Visit (INDEPENDENT_AMBULATORY_CARE_PROVIDER_SITE_OTHER): Payer: BC Managed Care – PPO | Admitting: Family Medicine

## 2021-05-28 VITALS — BP 108/68 | HR 83 | Ht 70.0 in | Wt 244.2 lb

## 2021-05-28 DIAGNOSIS — B20 Human immunodeficiency virus [HIV] disease: Secondary | ICD-10-CM

## 2021-05-28 DIAGNOSIS — Z7689 Persons encountering health services in other specified circumstances: Secondary | ICD-10-CM

## 2021-05-28 DIAGNOSIS — Z23 Encounter for immunization: Secondary | ICD-10-CM | POA: Diagnosis not present

## 2021-05-28 DIAGNOSIS — F1020 Alcohol dependence, uncomplicated: Secondary | ICD-10-CM | POA: Diagnosis not present

## 2021-05-28 DIAGNOSIS — G894 Chronic pain syndrome: Secondary | ICD-10-CM

## 2021-05-28 DIAGNOSIS — G629 Polyneuropathy, unspecified: Secondary | ICD-10-CM

## 2021-05-28 DIAGNOSIS — M4807 Spinal stenosis, lumbosacral region: Secondary | ICD-10-CM

## 2021-05-28 DIAGNOSIS — F3341 Major depressive disorder, recurrent, in partial remission: Secondary | ICD-10-CM

## 2021-05-28 DIAGNOSIS — I10 Essential (primary) hypertension: Secondary | ICD-10-CM | POA: Diagnosis not present

## 2021-05-28 DIAGNOSIS — G2581 Restless legs syndrome: Secondary | ICD-10-CM

## 2021-05-28 DIAGNOSIS — F17200 Nicotine dependence, unspecified, uncomplicated: Secondary | ICD-10-CM

## 2021-05-28 DIAGNOSIS — F411 Generalized anxiety disorder: Secondary | ICD-10-CM

## 2021-05-28 MED ORDER — ROPINIROLE HCL 0.5 MG PO TABS
0.5000 mg | ORAL_TABLET | Freq: Every day | ORAL | 2 refills | Status: DC
Start: 2021-05-28 — End: 2021-08-18

## 2021-05-28 NOTE — Assessment & Plan Note (Signed)
Followed by ID specialist Dr Delaine Lame On [place]

## 2021-05-28 NOTE — Assessment & Plan Note (Signed)
Currently stable Partial remission Continue current therapy

## 2021-05-28 NOTE — Assessment & Plan Note (Signed)
Remains alcohol free Last drink 09/2020

## 2021-05-28 NOTE — Patient Instructions (Addendum)
Thank you for coming to the office today.  Keep working on the exercise regimen.  Cut the Mirtazapine Remeron in half, so now a dose of 15mg  nightly. Try this for 2-4 weeks, then if ready and feel you don't need it, can notify us and we can order you the 7.5mg  low dose, and you can take that for another 2-4 weeks then come off.  Keep on the Gabapentin  Add new Ropinirole (requip) nightly medicine, 0.5mg  nightly and can double dose if / when ready we can order higher doses if this works.  Tadalfil (generic cialis) 30 pills per bottle, use goodrx for discount.  Likely I do think the neuropathy can be related to the spine and or the alcohol history causing nerve damage.  Alpha Lipoic Acid 600-800mg  natural herbal supplement.   Please schedule a Follow-up Appointment to: Return in about 3 months (around 08/28/2021) for 3 month follow-up Weight, Neuropathy / RLS / Mood/Insomnia.  If you have any other questions or concerns, please feel free to call the office or send a message through [place]. You may also schedule an earlier appointment if necessary.  Additionally, you may be receiving a survey about your experience at our office within a few days to 1 week by e-mail or mail. We value your feedback.  Nobie Putnam, DO [place]

## 2021-05-28 NOTE — Progress Notes (Signed)
Subjective:    Patient ID: Daryl Shelton, male    DOB: 02-May-1961, 60 y.o.   MRN: [id number]  Daryl Shelton is a 60 y.o. male presenting on 05/28/2021 for Establish Care  Previously followed by Mikeal Hawthorne and then also in [place] for period of time. He has had insurance change. He has relocated here.  HPI  Lumbar DDD S/p Spinal surgery Lumbar Laminectomy 02/16/21 Dr Dawley He had complications with numbness from waist down, has improved. - He also has some cervical spine DDD Current question regarding neuropathy  Alcohol Abuse history Neuropathy + RLS Abstained now from alcohol since 09/22/20, he was drinking daily for 30 years Prior history  R worse than Left. Both burning and also has RLS. 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 Gabapentin 900mg  TID (600 mg tab + half = 900)  Weight Gain Remeron 30mg , he is aware of side effect wt gain Eats ice cream more regularly, worse after quit alcohol, did lose some weight after. He bought a bicycle the other day  ED On Cialis PRN  Major Depression recurrent Generalized Anxiety Hx Slight Agoraphobia Working with therapist Hardie Shackleton History of childhood trauma, contributed  On Sertraline 100mg  daily and Remeron 30mg  daily He had cut dose of remeron in half before, but he was worried about feeling anxious not being able to sleep in past. Has tried Trazodone before  HIV, chronic, controlled and undetectable Followed by Dr Delaine Lame ([facility]) On Biktarvy  CHRONIC HTN: Reports no concerns Current Meds - Lisinopril 40mg  daily, HCTZ 25mg  daily   Reports good compliance, took meds today. Tolerating well, w/o complaints. Denies CP, dyspnea, HA, edema, dizziness / lightheadedness   Tobacco Dependence   Health Maintenance:  Due for Flu Shot, will receive today    Depression screen [facility] 2/9 05/28/2021  Decreased Interest 0  Down, Depressed, Hopeless 1  PHQ - 2 Score 1  Altered sleeping 0   Tired, decreased energy 1  Change in appetite 1  Feeling bad or failure about yourself  0  Trouble concentrating 0  Moving slowly or fidgety/restless 0  Suicidal thoughts 0  PHQ-9 Score 3  Difficult doing work/chores Not difficult at all   GAD 7 : Generalized Anxiety Score 05/28/2021  Nervous, Anxious, on Edge 1  Control/stop worrying 1  Worry too much - different things 1  Trouble relaxing 0  Restless 0  Easily annoyed or irritable 1  Afraid - awful might happen 1  Total GAD 7 Score 5  Anxiety Difficulty Not difficult at all      Past Medical History:  Diagnosis Date   Alcoholism ([place])    Anxiety    Arthritis    Bell's palsy    Depression    HIV (human immunodeficiency virus infection) ([place])    Hypertension    Past Surgical History:  Procedure Laterality Date   LUMBAR LAMINECTOMY/DECOMPRESSION MICRODISCECTOMY N/A 02/16/2021   Procedure: OPEN LUMBAR LAMINECTOMY, LUMBAR TWO-THREE, LUMBAR THREE-FOUR, LUMBAR FOUR-FIVE,LUMBAR FIVE-SACRAL ONE;  Surgeon: Karsten Ro, DO;  Location: [facility];  Service: Neurosurgery;  Laterality: N/A;   Social History   Socioeconomic History   Marital status: Married    Spouse name: Not on file   Number of children: Not on file   Years of education: Not on file   Highest education level: Not on file  Occupational History   Not on file  Tobacco Use   Smoking status: Every Day    Packs/day:  0.50    Years: 40.00    Pack years: 20.00    Types: Cigarettes   Smokeless tobacco: Never  Vaping Use   Vaping Use: Never used  Substance and Sexual Activity   Alcohol use: Not Currently   Drug use: No   Sexual activity: Yes  Other Topics Concern   Not on file  Social History Narrative   Not on file   Social Determinants of Health   Financial Resource Strain: Not on file  Food Insecurity: Not on file  Transportation Needs: Not on file  Physical Activity: Not on file  Stress: Not on file  Social Connections: Not on file  Intimate  Partner Violence: Not on file   Family History  Problem Relation Age of Onset   CAD Father    Current Outpatient Medications on File Prior to Visit  Medication Sig   atorvastatin (LIPITOR) 20 MG tablet Take 20 mg by mouth daily.   bictegravir-emtricitabine-tenofovir AF (BIKTARVY) 50-200-25 MG TABS tablet Take 1 tablet by mouth daily.   chlorhexidine (PERIDEX) 0.12 % solution Use as directed 15 mLs in the mouth or throat daily.   gabapentin (NEURONTIN) 600 MG tablet TAKE 1 AND 1/2 TABLETS(900 MG) BY MOUTH THREE TIMES DAILY   hydrochlorothiazide (HYDRODIURIL) 25 MG tablet TAKE 1 TABLET(25 MG) BY MOUTH DAILY (Patient taking differently: Take 25 mg by mouth daily.)   ketoconazole (NIZORAL) 2 % cream Apply 1 application topically daily.   lisinopril (ZESTRIL) 40 MG tablet Take 40 mg by mouth daily.   methocarbamol (ROBAXIN) 500 MG tablet Take 1 tablet (500 mg total) by mouth 4 (four) times daily.   mirtazapine (REMERON) 30 MG tablet Take 0.5 tablets (15 mg total) by mouth at bedtime.   Multiple Vitamins-Minerals (MULTIVITAMIN ADULTS 50+) TABS Take 1 tablet by mouth daily.   nicotine (NICODERM CQ - DOSED IN MG/24 HOURS) 21 mg/24hr patch Place 21 mg onto the skin daily as needed (nicontine dependence).   sertraline (ZOLOFT) 100 MG tablet Take 200 mg by mouth daily.   tadalafil (CIALIS) 10 MG tablet Take 1 tablet (10 mg total) by mouth every other day as needed for erectile dysfunction.   No current facility-administered medications on file prior to visit.    Review of Systems Per HPI unless specifically indicated above      Objective:    BP 108/68   Pulse 83   Ht 5\' 10"  (1.778 m)   Wt 244 lb 3.2 oz (110.8 kg)   SpO2 97%   BMI 35.04 kg/m   Wt Readings from Last 3 Encounters:  05/28/21 244 lb 3.2 oz (110.8 kg)  03/11/21 235 lb 8 oz (106.8 kg)  03/02/21 235 lb 8 oz (106.8 kg)    Physical Exam Vitals and nursing note reviewed.  Constitutional:      General: He is not in acute  distress.    Appearance: Normal appearance. He is well-developed. He is not diaphoretic.     Comments: Well-appearing, comfortable, cooperative  HENT:     Head: Normocephalic and atraumatic.  Eyes:     General:        Right eye: No discharge.        Left eye: No discharge.     Conjunctiva/sclera: Conjunctivae normal.  Cardiovascular:     Rate and Rhythm: Normal rate.  Pulmonary:     Effort: Pulmonary effort is normal.  Skin:    General: Skin is warm and dry.     Findings: No erythema or rash.  Neurological:     Mental Status: He is alert and oriented to person, place, and time.  Psychiatric:        Mood and Affect: Mood normal.        Behavior: Behavior normal.        Thought Content: Thought content normal.     Comments: Well groomed, good eye contact, normal speech and thoughts   Results for orders placed or performed during the hospital encounter of 03/11/21  [place] rt PCR ([place] only)   Specimen: Anal  Result Value Ref Range   Specimen source GC/Chlam RECTUM    Chlamydia Tr NOT DETECTED NOT DETECTED   N gonorrhoeae NOT DETECTED NOT DETECTED      Assessment & Plan:   Problem List Items Addressed This Visit     Tobacco use   Spinal stenosis of lumbosacral region   Neuropathy   Major depressive disorder, recurrent, in partial remission (HCC)    Currently stable Partial remission Continue current therapy      Relevant Medications   mirtazapine (REMERON) 30 MG tablet   GAD (generalized anxiety disorder)    Stable On med management SSRI      Relevant Medications   mirtazapine (REMERON) 30 MG tablet   Essential hypertension - Primary   Currently asymptomatic HIV infection, with history of HIV-related illness ([place])    Followed by ID specialist Dr Delaine Lame On Biktarvy      Chronic pain syndrome   Relevant Medications   mirtazapine (REMERON) 30 MG tablet   Alcohol dependence, uncomplicated ([place])    Remains alcohol free Last drink 09/2020       Other Visit Diagnoses     Encounter to establish care with new doctor       Restless leg syndrome       Relevant Medications   rOPINIRole (REQUIP) 0.5 MG tablet   Needs flu shot       Relevant Orders   Flu Vaccine QUAD 20mo+IM (Fluarix, Fluzone & Alfiuria Quad PF) (Completed)       Updated Health Maintenance information Flu Shot today  Encouraged improvement to lifestyle with diet and exercise Goal of weight loss   Keep working on the exercise regimen.  Likely cause of some weight gain Mirtazapine - Cut the Mirtazapine Remeron in half, so now a dose of 15mg  nightly. Try this for 2-4 weeks, then if ready and feel you don't need it, can notify us and we can order you the 7.5mg  low dose, and you can take that for another 2-4 weeks then come off.  Keep on the Gabapentin  Add new Ropinirole (requip) nightly medicine, 0.5mg  nightly and can double dose if / when ready we can order higher doses if this works.  Tadalfil (generic cialis) 30 pills per bottle, use goodrx for discount.  Likely Alcohol toxicity neuropathy injury Continue on current therapy w/ Gabapentin   Alpha Lipoic Acid 600-800mg  natural herbal supplement.   Meds ordered this encounter  Medications   rOPINIRole (REQUIP) 0.5 MG tablet    Sig: Take 1 tablet (0.5 mg total) by mouth at bedtime. Dose increase up to 2 pills if tolerated after 2-4 weeks.    Dispense:  30 tablet    Refill:  2      Follow up plan: Return in about 3 months (around 08/28/2021) for 3 month follow-up Weight, Neuropathy / RLS / Mood/Insomnia.  Nobie Putnam, [facility] 05/28/2021, 3:44 PM

## 2021-05-28 NOTE — Assessment & Plan Note (Signed)
Stable On med management SSRI

## 2021-06-02 ENCOUNTER — Ambulatory Visit (INDEPENDENT_AMBULATORY_CARE_PROVIDER_SITE_OTHER): Payer: BC Managed Care – PPO | Admitting: Podiatry

## 2021-06-02 ENCOUNTER — Other Ambulatory Visit: Payer: Self-pay

## 2021-06-02 DIAGNOSIS — B351 Tinea unguium: Secondary | ICD-10-CM | POA: Diagnosis not present

## 2021-06-02 MED ORDER — TERBINAFINE HCL 250 MG PO TABS: 250.0000 mg | ORAL_TABLET | Freq: Every day | ORAL | 0 refills | Status: AC

## 2021-06-02 NOTE — Patient Instructions (Signed)
Look for urea 40% cream or ointment and apply to the thickened dry skin / calluses. This can be bought over the counter, at a pharmacy or online such as [employer].  

## 2021-06-03 ENCOUNTER — Encounter: Payer: Self-pay | Admitting: Family Medicine

## 2021-06-03 MED ORDER — NICOTINE 21 MG/24HR TD PT24: 21.0000 mg | MEDICATED_PATCH | Freq: Every day | TRANSDERMAL | 0 refills | Status: DC | PRN

## 2021-06-03 NOTE — Progress Notes (Signed)
  Subjective:  Patient ID: Daryl Shelton, male    DOB: 1960/10/04,  MRN: [id number]  Chief Complaint  Patient presents with   Nail Problem    Thick painful toenails, 3 month follow up    60 y.o. male presents with the above complaint. History confirmed with patient.  Has had some improvement he had no side effects from the Lamisil  Objective:  Physical Exam: warm, good capillary refill, no trophic changes or ulcerative lesions, normal DP and PT pulses, and normal sensory exam.  Onychomycosis with thickened elongated nail plates with yellow discoloration subungual debris bilateral, some proximal clearing.  He also has dry cracking skin that is itchy on the heels.      Assessment:   1. Onychomycosis       Plan:  Patient was evaluated and treated and all questions answered.  Today we again discussed the etiology and treatment options for the condition in detail with the patient. Educated patient on the topical and oral treatment options for mycotic nails. Recommended debridement of the nails today. Sharp and mechanical debridement performed of all painful and mycotic nails today. Nails debrided in length and thickness using a nail nipper to level of comfort.  I think he would benefit from a second course of Lamisil and I sent this to his pharmacy     Return in about 3 months (around 09/02/2021) for follow up after nail fungus treatment.

## 2021-06-11 ENCOUNTER — Other Ambulatory Visit: Payer: Self-pay | Admitting: Infectious Diseases

## 2021-06-11 MED ORDER — BIKTARVY 50-200-25 MG PO TABS: 1.0000 | ORAL_TABLET | Freq: Every day | ORAL | 6 refills | Status: DC

## 2021-06-17 NOTE — Unmapped (Signed)
Complex Case Management  SUMMARY NOTE    Attempted to contact pt today at Home number to introduce Complex Case Management services. Left message to return call.; 1st attempt    Discuss at next visit: Introduction to Complex Case Management

## 2021-06-21 ENCOUNTER — Telehealth (INDEPENDENT_AMBULATORY_CARE_PROVIDER_SITE_OTHER): Payer: BC Managed Care – PPO | Admitting: Family Medicine

## 2021-06-21 ENCOUNTER — Encounter: Payer: Self-pay | Admitting: Family Medicine

## 2021-06-21 VITALS — Ht 70.0 in | Wt 239.0 lb

## 2021-06-21 DIAGNOSIS — F17200 Nicotine dependence, unspecified, uncomplicated: Secondary | ICD-10-CM

## 2021-06-21 DIAGNOSIS — M722 Plantar fascial fibromatosis: Secondary | ICD-10-CM

## 2021-06-21 DIAGNOSIS — M79671 Pain in right foot: Secondary | ICD-10-CM

## 2021-06-21 MED ORDER — NICOTINE 21 MG/24HR TD PT24: 21.0000 mg | MEDICATED_PATCH | Freq: Every day | TRANSDERMAL | 1 refills | Status: DC | PRN

## 2021-06-21 NOTE — Unmapped (Signed)
Complex Case Management  SUMMARY NOTE    Attempted to contact pt today at Home number to introduce Complex Case Management services. Left message to return call.; 2nd attempt    Discuss at next visit: No answer, letter sent

## 2021-06-21 NOTE — Patient Instructions (Addendum)
Thank you for coming to the office today.  You most likely have Plantar Fasciitis of heel / foot. - This is inflammation of the fibrous connection on the bottom of the foot, and can have small micro tears over time that become painful. It usually will have flare ups lasting days to weeks, and may come back after it heals if it is re-aggravated again. - Often there is a bone spur or arthritis of the heel bone that causes this - Also it may be caused by abnormal footwear, walking pattern or other problems  If you are experiencing an acute flare with pain, this is usually worst first thing in the morning when the plantar fascia is tight and stiff. First step out of bed is painful usually, and it may gradually improve with stretching and walking.  Try the TENSION NIGHT SPLINT to help stretch plantar fascia overnight.  START anti inflammatory topical - OTC Voltaren (generic Diclofenac) topical 2-4 times a day as needed for pain swelling of affected joint for 1-2 weeks or longer.  - It is safe to take Tylenol Ext Str 500mg  tabs - take 1 to 2 (max dose 1000mg ) every 6 hours as needed for breakthrough pain, max 24 hour daily dose is 6 to 8 tablets or 4000mg   Recommend: - Rest / relative rest with activity modification avoid overuse / prolonged stand - Ice packs (make sure you use a towel or sock / something to protect skin)  May also try topical muscle rub, icy hot, tiger balm  Start the exercises listed below, gradually increase them as instructed. May be sore at first and hopefully will stretch out and help reduce pain later.  If not improving after 1-2 weeks on medicine, and stretching exercises and some rest - then can contact our office again for re-evaluation may consider other causes or can refer you to Orthopedic specialist to have second opinion, and consider a steroid injection.   Please schedule a Follow-up Appointment to: Return if symptoms worsen or fail to improve.  If you have any  other questions or concerns, please feel free to call the office or send a message through [place]. You may also schedule an earlier appointment if necessary.  Additionally, you may be receiving a survey about your experience at our office within a few days to 1 week by e-mail or mail. We value your feedback.  Nobie Putnam, DO [facility], [facility]              Plantar Fascia Stretches / Exercises  See other page with pictures of each exercise.  Start with 1 or 2 of these exercises that you are most comfortable with. Do not do any exercises that cause you significant worsening pain. Some of these may cause some "stretching soreness" but it should go away after you stop the exercise, and get better over time. Gradually increase up to 3-4 exercises as tolerated.  You may begin exercising the muscles of your foot right away by gently stretching them as follows:  Stretching: Towel stretch: Sit on a hard surface with your injured leg stretched out in front of you. Loop a towel around the ball of your foot and pull the towel toward your body keeping your knee straight. Hold this position for 15 to 30 seconds then relax. Repeat 3 times. When the towel stretch becomes to easy, you may begin doing the standing calf stretch.  Standing calf stretch: Facing a wall, put your hands against the wall  at about eye level. Keep the injured leg back, the uninjured leg forward, and the heel of your injured leg on the floor. Turn your injured foot slightly inward (as if you were pigeon-toed) as you slowly lean into the wall until you feel a stretch in the back of your calf. Hold for 15 to 30 seconds. Repeat 3 times. Do this exercise several times each day. When you can stand comfortably on your injured foot, you can begin stretching the bottom of your foot using the plantar fascia stretch.  Plantar fascia stretch: Stand with the ball of your injured foot on a stair. Reach for the  bottom step with your heel until you feel a stretch in the arch of your foot. Hold this position for 15 to 30 seconds and then relax. Repeat 3 times. After you have stretched the bottom muscles of your foot, you can begin strengthening the top muscles of your foot.  Frozen can roll: Roll your bare injured foot back and forth from your heel to your mid-arch over a frozen juice can. Repeat for 3 to 5 minutes. This exercise is particularly helpful if done first thing in the morning. Towel pickup: With your heel on the ground, pick up a towel with your toes. Release. Repeat 10 to 20 times. When this gets easy, add more resistance by placing a book or small weight on the towel. Static and dynamic balance exercises Place a chair next to your non-injured leg and stand upright. (This will provide you with balance if needed.) Stand on your injured foot. Try to raise the arch of your foot while keeping your toes on the floor. Try to maintain this position and balance on your injured side for 30 seconds. This exercise can be made more difficult by doing it on a piece of foam or a pillow, or with your eyes closed. Stand in the same position as above. Keep your foot in this position and reach forward in front of you with your injured side's hand, allowing your knee to bend. Repeat this 10 times while maintaining the arch height. This exercise can be made more difficult by reaching farther in front of you. Do 2 sets. Stand in the same position as above. While maintaining your arch height, reach the injured side's hand across your body toward the chair. The farther you reach, the more challenging the exercise. Do 2 sets of 10.  Next, you can begin strengthening the muscles of your foot and lower leg by using elastic tubing.  Strengthening: Resisted dorsiflexion: Sit with your injured leg out straight and your foot facing a doorway. Tie a loop in one end of the tubing. Put your foot through the loop so that the tubing  goes around the arch of your foot. Tie a knot in the other end of the tubing and shut the knot in the door. Move backward until there is tension in the tubing. Keeping your knee straight, pull your foot toward your body, stretching the tubing. Slowly return to the starting position. Do 3 sets of 10. Resisted plantar flexion: Sit with your leg outstretched and loop the middle section of the tubing around the ball of your foot. Hold the ends of the tubing in both hands. Gently press the ball of your foot down and point your toes, stretching the tubing. Return to the starting position. Do 3 sets of 10. Resisted inversion: Sit with your legs out straight and cross your uninjured leg over your injured ankle. Wrap the  tubing around the ball of your injured foot and then loop it around your uninjured foot so that the tubing is anchored there at one end. Hold the other end of the tubing in your hand. Turn your injured foot inward and upward. This will stretch the tubing. Return to the starting position. Do 3 sets of 10. Resisted eversion: Sit with both legs stretched out in front of you, with your feet about a shoulder's width apart. Tie a loop in one end of the tubing. Put your injured foot through the loop so that the tubing goes around the arch of that foot and wraps around the outside of the uninjured foot. Hold onto the other end of the tubing with your hand to provide tension. Turn your injured foot up and out. Make sure you keep your uninjured foot still so that it will allow the tubing to stretch as you move your injured foot. Return to the starting position. Do 3 sets of 10.

## 2021-06-21 NOTE — Progress Notes (Addendum)
Subjective:    Patient ID: Daryl Shelton, male    DOB: [birth year], 60 y.o.   MRN: [id number]  Daryl Shelton is a 60 y.o. male presenting on 06/21/2021 for Foot Pain  Virtual / Telehealth Encounter - Video Visit via MyChart The purpose of this virtual visit is to provide medical care while limiting exposure to the novel coronavirus (COVID19) for both patient and office staff.  Consent was obtained for remote visit:  Yes.   Answered questions that patient had about telehealth interaction:  Yes.   I discussed the limitations, risks, security and privacy concerns of performing an evaluation and management service by video/telephone. I also discussed with the patient that there may be a patient responsible charge related to this service. The patient expressed understanding and agreed to proceed.  Patient Location: Home Provider Location: [facility] (Office)  Participants in virtual visit: - Patient: Daryl Shelton - CMA: Orinda Kenner, CMA - Provider: Dr Parks Ranger   HPI  Right Heel Pain Reports symptoms with pain when puts pressure and weight bear on R heel. He admits to be limping on it and he says it is improved when he is walking. He has had something similar years ago and it resolved with wrapping foot in evening. He says often can strain his R foot, when getting up from some positions. has podiatry not seen for this issue  update half of remeron now working fine.   Depression screen [facility] 2/9 05/28/2021  Decreased Interest 0  Down, Depressed, Hopeless 1  PHQ - 2 Score 1  Altered sleeping 0  Tired, decreased energy 1  Change in appetite 1  Feeling bad or failure about yourself  0  Trouble concentrating 0  Moving slowly or fidgety/restless 0  Suicidal thoughts 0  PHQ-9 Score 3  Difficult doing work/chores Not difficult at all    Social History   Tobacco Use   Smoking status: Every Day    Packs/day: 0.50    Years: 40.00    Pack  years: 20.00    Types: Cigarettes   Smokeless tobacco: Never  Vaping Use   Vaping Use: Never used  Substance Use Topics   Alcohol use: Not Currently   Drug use: No    Review of Systems Per HPI unless specifically indicated above     Objective:    Ht 5\' 10"  (1.778 m)   Wt 239 lb (108.4 kg)   BMI 34.29 kg/m   Wt Readings from Last 3 Encounters:  06/21/21 239 lb (108.4 kg)  05/28/21 244 lb 3.2 oz (110.8 kg)  03/11/21 235 lb 8 oz (106.8 kg)    Physical Exam  Note examination was completely remotely via video observation objective data only  Gen - well-appearing, no acute distress or apparent pain, comfortable HEENT - eyes appear clear without discharge or redness Heart/Lungs - cannot examine virtually - observed no evidence of coughing or labored breathing. Skin - face visible today- no rash Neuro - awake, alert, oriented Psych - not anxious appearing   Results for orders placed or performed during the hospital encounter of 03/11/21  Paint rt PCR ([place] only)   Specimen: Anal  Result Value Ref Range   Specimen source GC/Chlam RECTUM    Chlamydia Tr NOT DETECTED NOT DETECTED   N gonorrhoeae NOT DETECTED NOT DETECTED      Assessment & Plan:   Problem List Items Addressed This Visit   None Visit Diagnoses  Plantar fasciitis, right    -  Primary   Tobacco dependence       Relevant Medications   nicotine (NICODERM CQ - DOSED IN MG/24 HOURS) 21 mg/24hr patch   Pain of right heel           Clinically consistent with 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   - No known injury. Unlikely fracture based on history, no bony tenderness.  Plan: 1. Reviewed diagnosis and management of plantar fasciitis 2. Start topical NSAID Voltaren PRN 3. May take Tylenol PRN breakthrough - Use Tension Night Splint 4. May use topical muscle rub, ice 5. Emphasized importance of relative rest, ice, and avoid prolonged standing and  overuse 6. Handout given and explained appropriate stretching and home exercises important to do first thing in morning and later 7. Follow-up 4-6 weeks if not improved, consider referral to Podiatry for further eval x-ray and injection if indicated.    Meds ordered this encounter  Medications   nicotine (NICODERM CQ - DOSED IN MG/24 HOURS) 21 mg/24hr patch    Sig: Place 1 patch (21 mg total) onto the skin daily as needed (nicontine dependence).    Dispense:  84 patch    Refill:  1      Follow up plan: Return if symptoms worsen or fail to improve.   Patient verbalizes understanding with the above medical recommendations including the limitation of remote medical advice.  Specific follow-up and call-back criteria were given for patient to follow-up or seek medical care more urgently if needed.  Total duration of direct patient care provided via video conference: 15 minutes   Nobie Putnam, [facility] 06/21/2021, 4:36 PM

## 2021-06-24 ENCOUNTER — Other Ambulatory Visit: Payer: Self-pay

## 2021-06-24 ENCOUNTER — Ambulatory Visit (INDEPENDENT_AMBULATORY_CARE_PROVIDER_SITE_OTHER): Payer: BC Managed Care – PPO | Admitting: Dermatology

## 2021-06-24 DIAGNOSIS — L821 Other seborrheic keratosis: Secondary | ICD-10-CM

## 2021-06-24 DIAGNOSIS — B079 Viral wart, unspecified: Secondary | ICD-10-CM

## 2021-06-24 DIAGNOSIS — L820 Inflamed seborrheic keratosis: Secondary | ICD-10-CM

## 2021-06-24 DIAGNOSIS — L570 Actinic keratosis: Secondary | ICD-10-CM

## 2021-06-24 DIAGNOSIS — D229 Melanocytic nevi, unspecified: Secondary | ICD-10-CM

## 2021-06-24 NOTE — Patient Instructions (Addendum)
Viral Warts & Molluscum Contagiosum  Viral warts and molluscum contagiosum are growths of the skin caused by viral infection of the skin. If you have been given the diagnosis of viral warts or molluscum contagiosum there are a few things that you must understand about your condition:  There is no guaranteed treatment method available for this condition. Multiple treatments may be required, The treatments may be time consuming and require multiple visits to the dermatology office. The treatment may be expensive. You will be charged each time you come into the office to have the spots treated. The treated areas may develop new lesions further complicating treatment. The treated areas may leave a scar. There is no guarantee that even after multiple treatments that the spots will be successfully treated. These are caused by a viral infection and can be spread to other areas of the skin and to other people by direct contact. Therefore, new spots may occur.  Cantharidin Plus is a blistering agent that comes from a beetle.  It needs to be washed off in about 4 hours after application.  Although it is painless when applied in office, it may cause symptoms of mild pain and burning several hours later.  Treated areas will swell and turn red, and blisters may form.  Vaseline and a bandaid may be applied until wound has healed.  Once healed, the skin may remain temporarily discolored.  It can take weeks to months for pigmentation to return to normal.  Advised to wash off with soap and water in 4 hours or sooner if it becomes tender before then.   If You Need Anything After Your Visit  If you have any questions or concerns for your doctor, please call our main line at [phone] and press option 4 to reach your doctor's medical assistant. If no one answers, please leave a voicemail as directed and we will return your call as soon as possible. Messages left after 4 pm will be answered the following business  day.   You may also send [place] a message via [place]. We typically respond to MyChart messages within 1-2 business days.  For prescription refills, please ask your pharmacy to contact our office. Our fax number is [phone].  If you have an urgent issue when the clinic is closed that cannot wait until the next business day, you can page your doctor at the number below.    Please note that while we do our best to be available for urgent issues outside of office hours, we are not available 24/7.   If you have an urgent issue and are unable to reach [place], you may choose to seek medical care at your doctor's office, retail clinic, urgent care center, or emergency room.  If you have a medical emergency, please immediately call 911 or go to the emergency department.  Pager Numbers  - Dr. Nehemiah Massed: [phone]  - Dr. Laurence Ferrari: [phone]  - Dr. Nicole Kindred: [phone]  In the event of inclement weather, please call our main line at [phone] for an update on the status of any delays or closures.  Dermatology Medication Tips: Please keep the boxes that topical medications come in in order to help keep track of the instructions about where and how to use these. Pharmacies typically print the medication instructions only on the boxes and not directly on the medication tubes.   If your medication is too expensive, please contact our office at [phone] option 4 or send [place] a message through [place].  We are unable to tell what your co-pay for medications will be in advance as this is different depending on your insurance coverage. However, we may be able to find a substitute medication at lower cost or fill out paperwork to get insurance to cover a needed medication.   If a prior authorization is required to get your medication covered by your insurance company, please allow [place] 1-2 business days to complete this process.  Drug prices often vary depending on where the prescription is filled and  some pharmacies may offer cheaper prices.  The website www.goodrx.com contains coupons for medications through different pharmacies. The prices here do not account for what the cost may be with help from insurance (it may be cheaper with your insurance), but the website can give you the price if you did not use any insurance.  - You can print the associated coupon and take it with your prescription to the pharmacy.  - You may also stop by our office during regular business hours and pick up a GoodRx coupon card.  - If you need your prescription sent electronically to a different pharmacy, notify our office through [facility] or by phone at [phone] option 4.     Si Usted Necesita Algo Despus de Su Visita  Tambin puede enviarnos un mensaje a travs de Pharmacist, community. Por lo general respondemos a los mensajes de MyChart en el transcurso de 1 a 2 das hbiles.  Para renovar recetas, por favor pida a su farmacia que se ponga en contacto con nuestra oficina. Harland Dingwall de fax es [place] [phone].  Si tiene un asunto urgente cuando la clnica est cerrada y que no puede esperar hasta el siguiente da hbil, puede llamar/localizar a su doctor(a) al nmero que aparece a continuacin.   Por favor, tenga en cuenta que aunque hacemos todo lo posible para estar disponibles para asuntos urgentes fuera del horario de [place], no estamos disponibles las 24 horas del da, los 7 das de la [place].   Si tiene un problema urgente y no puede comunicarse con nosotros, puede optar por buscar atencin mdica  en el consultorio de su doctor(a), en una clnica privada, en un centro de atencin urgente o en una sala de emergencias.  Si tiene Engineering geologist, por favor llame inmediatamente al 911 o vaya a la sala de emergencias.  Nmeros de bper  - Dr. Nehemiah Massed: [phone]  - Dra. Moye: [phone]  - Dra. Nicole Kindred: [phone]  En caso de inclemencias del [place], por favor llame a Johnsie Kindred principal al [phone] para una actualizacin sobre el [place] de cualquier retraso o cierre.  Consejos para la medicacin en dermatologa: Por favor, guarde las cajas en las que vienen los medicamentos de uso tpico para ayudarle a seguir las instrucciones sobre dnde y cmo usarlos. Las farmacias generalmente imprimen las instrucciones del medicamento slo en las cajas y no directamente en los tubos del [place].   Si su medicamento es muy caro, por favor, pngase en contacto con Zigmund Daniel llamando al [phone] y presione la opcin 4 o envenos un mensaje a travs de Pharmacist, community.   No podemos decirle cul ser su copago por los medicamentos por adelantado ya que esto es diferente dependiendo de la cobertura de su seguro. Sin embargo, es posible que podamos encontrar un medicamento sustituto a Electrical engineer un formulario para que el seguro cubra el medicamento que se considera necesario.   Si se requiere una autorizacin previa para que su  compaa de seguros [place] su medicamento, por favor permtanos de 1 a 2 das hbiles para completar este proceso.  Los precios de los medicamentos varan con frecuencia dependiendo del Environmental consultant de dnde se surte la receta y alguna farmacias pueden ofrecer precios ms baratos.  El sitio web www.goodrx.com tiene cupones para medicamentos de Airline pilot. Los precios aqu no tienen en cuenta lo que podra costar con la ayuda del seguro (puede ser ms barato con su seguro), pero el sitio web puede darle el precio si no utiliz Research scientist (physical sciences).  - Puede imprimir el cupn correspondiente y llevarlo con su receta a la farmacia.  - Tambin puede pasar por nuestra oficina durante el horario de atencin regular y Charity fundraiser una tarjeta de cupones de GoodRx.  - Si necesita que su receta se enve electrnicamente a una farmacia diferente, informe a nuestra oficina a travs de MyChart de [place] o por telfono llamando al [phone] y presione la  opcin 4.

## 2021-06-24 NOTE — Progress Notes (Signed)
Follow-Up Visit   Subjective  Daryl Shelton is a 60 y.o. male who presents for the following: Actinic Keratosis (R frontal scalp - S/P LN2, check for persistence today) and Warts (right hand x 4, right wrist x 1, left upper arm x 4, left inner thigh x 1- patient thinks they may have resolved). Patient has noticed lesions on the R arm and abdomen that he would like checked today.   The following portions of the chart were reviewed this encounter and updated as appropriate:   Tobacco  Allergies  Meds  Problems  Med Hx  Surg Hx  Fam Hx      Review of Systems:  No other skin or systemic complaints except as noted in HPI or Assessment and Plan.  Objective  Well appearing patient in no apparent distress; mood and affect are within normal limits.  A focused examination was performed including the hands, arms, and face. Relevant physical exam findings are noted in the Assessment and Plan.  R frontal scalp x 4, R hand x 3, R dorsum wrist x 1 (8) Verrucous papules -- Discussed viral etiology and contagion.   L frontal scalp x 1 Erythematous thin papules/macules with gritty scale.   R flank x 2 Erythematous keratotic or waxy stuck-on papule or plaque.    Assessment & Plan  Viral warts, unspecified type (8) R frontal scalp x 4, R hand x 3, R dorsum wrist x 1  Discussed viral etiology and risk of spread.  Discussed multiple treatments may be required to clear warts.  Discussed possible post-treatment dyspigmentation and risk of recurrence.  Squaric acid applied to R upper inner arm for sensitization.   Squaric Acid 3% applied to warts today. Prior to application reviewed risk of inflammation and irritation.  Prior to procedure, discussed risks of blister formation, small wound, skin dyspigmentation, or rare scar following cryotherapy. Recommend Vaseline ointment to treated areas while healing.      Destruction of lesion - R frontal scalp x 4, R hand x 3, R dorsum wrist x  1 Complexity: simple   Destruction method: cryotherapy   Informed consent: discussed and consent obtained   Timeout:  patient name, date of birth, surgical site, and procedure verified Lesion destroyed using liquid nitrogen: Yes   Region frozen until ice ball extended beyond lesion: Yes   Outcome: patient tolerated procedure well with no complications   Post-procedure details: wound care instructions given    AK (actinic keratosis) L frontal scalp x 1  Prior to procedure, discussed risks of blister formation, small wound, skin dyspigmentation, or rare scar following cryotherapy. Recommend Vaseline ointment to treated areas while healing.  Actinic keratoses are precancerous spots that appear secondary to cumulative UV radiation exposure/sun exposure over time. They are chronic with expected duration over 1 year. A portion of actinic keratoses will progress to squamous cell carcinoma of the skin. It is not possible to reliably predict which spots will progress to skin cancer and so treatment is recommended to prevent development of skin cancer.  Recommend daily broad spectrum sunscreen SPF 30+ to sun-exposed areas, reapply every 2 hours as needed.  Recommend staying in the shade or wearing long sleeves, sun glasses (UVA+UVB protection) and wide brim hats (4-inch brim around the entire circumference of the hat). Call for new or changing lesions.   Destruction of lesion - L frontal scalp x 1 Complexity: simple   Destruction method: cryotherapy   Informed consent: discussed and consent obtained   Timeout:  patient name, date of birth, surgical site, and procedure verified Lesion destroyed using liquid nitrogen: Yes   Region frozen until ice ball extended beyond lesion: Yes   Outcome: patient tolerated procedure well with no complications   Post-procedure details: wound care instructions given    Inflamed seborrheic keratosis R flank x 2  Prior to procedure, discussed risks of blister  formation, small wound, skin dyspigmentation, or rare scar following cryotherapy. Recommend Vaseline ointment to treated areas while healing.   Destruction of lesion - R flank x 2 Complexity: simple   Destruction method: cryotherapy   Informed consent: discussed and consent obtained   Timeout:  patient name, date of birth, surgical site, and procedure verified Lesion destroyed using liquid nitrogen: Yes   Region frozen until ice ball extended beyond lesion: Yes   Outcome: patient tolerated procedure well with no complications   Post-procedure details: wound care instructions given    Seborrheic Keratoses  - Stuck-on, waxy, tan-brown papules and/or plaques  - Benign-appearing - Discussed benign etiology and prognosis. - Observe - Call for any changes  Melanocytic Nevi - Tan-brown and/or pink-flesh-colored symmetric macules and papules - Benign appearing on exam today - Observation - Call clinic for new or changing moles - Recommend daily use of broad spectrum spf 30+ sunscreen to sun-exposed areas.   Return in about 6 weeks (around 08/05/2021) for AK and wart follow up .  Luther Redo, CMA, am acting as scribe for Forest Gleason, MD .  Documentation: I have reviewed the above documentation for accuracy and completeness, and I agree with the above.  Forest Gleason, MD

## 2021-06-25 NOTE — Unmapped (Signed)
Reviewed chart and noticed that I have evaluated pt in over a year. He is overdue for a follow-up visit if he does not have a different PCP. If he is receiving care elsewhere, please disassociate my relationship with him in Epic.

## 2021-06-26 NOTE — Unmapped (Signed)
Noted  

## 2021-06-26 NOTE — Unmapped (Signed)
LM to schedule or remove [place] from being PCP

## 2021-07-06 ENCOUNTER — Ambulatory Visit: Payer: Self-pay

## 2021-07-06 ENCOUNTER — Encounter: Payer: Self-pay | Admitting: Dermatology

## 2021-07-06 NOTE — Telephone Encounter (Signed)
Patient called, left VM to return the call to the office to discuss symptoms with a nurse.   Summary: advice- cough   Pt called in with some concerns due to having a raspy cough, has a slight fever, pt took a Covid test last night which was negative, pt wanted some advice on what may be going on.

## 2021-07-07 NOTE — Telephone Encounter (Signed)
Second message left for pt. To call back.

## 2021-07-07 NOTE — Telephone Encounter (Signed)
I attempted to also call the patient back, no answer. I left a detail message on his voicemail with few options due to no appointments being available for this week. Options: Mychart virtual Evist , Mychart Urgent Visit or Acute/ Urgent care.

## 2021-07-07 NOTE — Telephone Encounter (Signed)
Pt called in with some concerns due to having a raspy cough, has a slight fever, pt took a Covid test last night which was negative, pt wanted some advice on what may be going on.   3rd attempt to contact patient and review symptoms of raspy cough, slight fever. No answer, left voicemail to call clinic back [phone]. Appt scheduled for 07/15/21.

## 2021-07-08 DIAGNOSIS — F4322 Adjustment disorder with anxiety: Secondary | ICD-10-CM | POA: Diagnosis not present

## 2021-07-09 ENCOUNTER — Telehealth: Payer: BC Managed Care – PPO | Admitting: Physician Assistant

## 2021-07-09 DIAGNOSIS — J069 Acute upper respiratory infection, unspecified: Secondary | ICD-10-CM | POA: Diagnosis not present

## 2021-07-09 MED ORDER — BENZONATATE 100 MG PO CAPS: 100.0000 mg | ORAL_CAPSULE | Freq: Three times a day (TID) | ORAL | 0 refills | Status: DC | PRN

## 2021-07-09 MED ORDER — IPRATROPIUM BROMIDE 0.03 % NA SOLN: 2.0000 | Freq: Two times a day (BID) | NASAL | 0 refills | Status: DC

## 2021-07-09 MED ORDER — PREDNISONE 20 MG PO TABS: 40.0000 mg | ORAL_TABLET | Freq: Every day | ORAL | 0 refills | Status: DC

## 2021-07-09 NOTE — Progress Notes (Signed)
E-Visit for Sinus Problems  We are sorry that you are not feeling well.  Here is how we plan to help!  Based on what you have shared with me it looks like you have sinusitis.  Sinusitis is inflammation and infection in the sinus cavities of the head.  Based on your presentation I believe you most likely have Acute Viral Sinusitis.This is an infection most likely caused by a virus. There is not specific treatment for viral sinusitis other than to help you with the symptoms until the infection runs its course.  You may use an oral decongestant such as Mucinex D or if you have glaucoma or high blood pressure use plain Mucinex. Saline nasal spray help and can safely be used as often as needed for congestion, I have prescribed: Ipratropium Bromide nasal spray 0.03% 2 sprays in eah nostril 2-3 times a day, Prednisone 20mg  Take 2 tablets with breakfast for 5 days, and tessalon perles Take 1 perle three times daily as needed for cough  Some authorities believe that zinc sprays or the use of Echinacea may shorten the course of your symptoms.  Sinus infections are not as easily transmitted as other respiratory infection, however we still recommend that you avoid close contact with loved ones, especially the very young and elderly.  Remember to wash your hands thoroughly throughout the day as this is the number one way to prevent the spread of infection!  Home Care: Only take medications as instructed by your medical team. Do not take these medications with alcohol. A steam or ultrasonic humidifier can help congestion.  You can place a towel over your head and breathe in the steam from hot water coming from a faucet. Avoid close contacts especially the very young and the elderly. Cover your mouth when you cough or sneeze. Always remember to wash your hands.  Get Help Right Away If: You develop worsening fever or sinus pain. You develop a severe head ache or visual changes. Your symptoms persist after you  have completed your treatment plan.  Make sure you Understand these instructions. Will watch your condition. Will get help right away if you are not doing well or get worse.   Thank you for choosing an e-visit.  Your e-visit answers were reviewed by a board certified advanced clinical practitioner to complete your personal care plan. Depending upon the condition, your plan could have included both over the counter or prescription medications.  Please review your pharmacy choice. Make sure the pharmacy is open so you can pick up prescription now. If there is a problem, you may contact your provider through [employer] and have the prescription routed to another pharmacy.  Your safety is important to [place]. If you have drug allergies check your prescription carefully.   For the next 24 hours you can use MyChart to ask questions about today's visit, request a non-urgent call back, or ask for a work or school excuse. You will get an email in the next two days asking about your experience. I hope that your e-visit has been valuable and will speed your recovery.  I provided 5 minutes of non face-to-face time during this encounter for chart review and documentation.

## 2021-07-09 NOTE — Telephone Encounter (Signed)
Pt. Called back. States he did not get the message from the office. Reviewed his options, verbalizes understanding.

## 2021-07-15 ENCOUNTER — Other Ambulatory Visit: Payer: Self-pay

## 2021-07-15 ENCOUNTER — Ambulatory Visit: Payer: BC Managed Care – PPO | Attending: Infectious Diseases | Admitting: Infectious Diseases

## 2021-07-15 VITALS — BP 116/74 | HR 90 | Resp 16 | Ht 70.0 in | Wt 238.0 lb

## 2021-07-15 DIAGNOSIS — F419 Anxiety disorder, unspecified: Secondary | ICD-10-CM | POA: Insufficient documentation

## 2021-07-15 DIAGNOSIS — R200 Anesthesia of skin: Secondary | ICD-10-CM | POA: Diagnosis not present

## 2021-07-15 DIAGNOSIS — F32A Depression, unspecified: Secondary | ICD-10-CM | POA: Diagnosis not present

## 2021-07-15 DIAGNOSIS — E785 Hyperlipidemia, unspecified: Secondary | ICD-10-CM | POA: Diagnosis not present

## 2021-07-15 DIAGNOSIS — B20 Human immunodeficiency virus [HIV] disease: Secondary | ICD-10-CM | POA: Diagnosis not present

## 2021-07-15 DIAGNOSIS — I10 Essential (primary) hypertension: Secondary | ICD-10-CM | POA: Diagnosis not present

## 2021-07-15 DIAGNOSIS — Z7901 Long term (current) use of anticoagulants: Secondary | ICD-10-CM | POA: Diagnosis not present

## 2021-07-15 DIAGNOSIS — M542 Cervicalgia: Secondary | ICD-10-CM | POA: Diagnosis not present

## 2021-07-15 DIAGNOSIS — F17210 Nicotine dependence, cigarettes, uncomplicated: Secondary | ICD-10-CM | POA: Insufficient documentation

## 2021-07-15 DIAGNOSIS — M48062 Spinal stenosis, lumbar region with neurogenic claudication: Secondary | ICD-10-CM | POA: Diagnosis not present

## 2021-07-15 DIAGNOSIS — Z79899 Other long term (current) drug therapy: Secondary | ICD-10-CM | POA: Insufficient documentation

## 2021-07-15 DIAGNOSIS — G629 Polyneuropathy, unspecified: Secondary | ICD-10-CM | POA: Diagnosis not present

## 2021-07-15 DIAGNOSIS — M5412 Radiculopathy, cervical region: Secondary | ICD-10-CM | POA: Diagnosis not present

## 2021-07-15 NOTE — Progress Notes (Signed)
NAME: Daryl Shelton  DOB: Dec 14, 1960  MRN: [id number]  Date/Time: 07/15/2021 12:12 PM  Pt is here for follow up of HIV Doing well Last Vl < 20 on 01/28/21 and cd4 925 On 100% biktarvy  Saw Dr.Moye and had seb warts frozen Says his eldest sister has gastric carcinoma and told she has 4-6 months to live   The following copied from last note Daryl Shelton is a 60 y.o. 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 Pt was  at [facility], until 2021 On Biktarvy - 100% adherent last cd4 on 02/04/20 was 588(49%) and Vl < 20  Nadir Cd4 unknown-as far as I can see [facility] records- 08/19/10- 629 OI -none  HAARt history Genvoya Dont know the medicine he took before Acquired thru sex with men  Educational psychologist specialty ? Past Medical History:  Diagnosis Date   Alcoholism ([place])    Anxiety    Arthritis    Bell's palsy    Depression    HIV (human immunodeficiency virus infection) ([place])    Hypertension    Spinal stenosis-   PSH 02/16/2021 Bilateral L2, L3, L4, L5, S1 laminectomies for decompression of neural elements with partial bilateral L2-3, L3-4, L4-5, L5-S1 medial facetectomies  SH Lives with his husband who is HIV positive and undetectable and is my patient Sister lives with them Smoker 1PPD No alcohol since Sep 22, 2020 Was drinking before for 30 years Past h/o substance use- clean many years Born [place] in [place] Now in [place] for the past 65yrs  Was working in [place] until Dec 2021.    Family History  Problem Relation Age of Onset   CAD Father   Twin brother -HIV 5 sisters- 3 of 5 opioid dependence due to back pain   Allergies  Allergen Reactions   Varenicline Other (See Comments)    CNS symptoms - anxiety, agitation     Pollen Extract Other (See Comments)    Congestion    ? Current Outpatient Medications  Medication Sig  Dispense Refill   atorvastatin (LIPITOR) 20 MG tablet Take by mouth.     BIKTARVY 50-200-25 MG TABS tablet TAKE 1 TABLET BY MOUTH DAILY 90 tablet 1   chlorhexidine (PERIDEX) 0.12 % solution 15 mLs 2 (two) times daily.     gabapentin (NEURONTIN) 600 MG tablet Take 1 tablet by mouth 3 (three) times daily.     hydrochlorothiazide (HYDRODIURIL) 25 MG tablet TAKE 1 TABLET(25 MG) BY MOUTH DAILY 90 tablet 3   ketoconazole (NIZORAL) 2 % cream Apply 1 application topically daily. 60 g 2   lisinopril (ZESTRIL) 40 MG tablet Take by mouth.     mirtazapine (REMERON) 30 MG tablet Take by mouth.     nicotine (NICODERM CQ - DOSED IN MG/24 HOURS) 21 mg/24hr patch Place onto the skin.           sertraline (ZOLOFT) 100 MG tablet Take 200 mg by mouth daily.     tadalafil (CIALIS) 10 MG tablet Take 1 tablet (10 mg total) by mouth every other day as needed for erectile dysfunction. 10 tablet 1   terbinafine (LAMISIL) 250 MG tablet Take 1 tablet (250 mg total) by mouth daily. 90 tablet 0   Naprosyn MVT   REVIEW OF SYSTEMS:  Const: negative fever, negative chills, some weight loss Eyes: negative diplopia or visual changes, negative eye pain ENT: negative coryza,  negative sore throat Resp: negative cough, hemoptysis, dyspnea Cards: negative for chest pain, palpitations, lower extremity edema GU: negative for frequency, dysuria and hematuria Skin: negative for rash and pruritus Heme: negative for easy bruising and gum/nose bleeding MS: negative for myalgias, arthralgias, back pain and muscle weakness Neurolo:numbness fingers Psych:  anxiety  Objective:  VITALS:  BP 116/74   Pulse 90   Resp 16   Ht 5\' 10"  (1.778 m)   Wt 238 lb (108 kg)   SpO2 93%   BMI 34.15 kg/m  PHYSICAL EXAM:  General: Alert, cooperative, no distress, appears stated age.  Head: Normocephalic, without obvious abnormality, atraumatic. Eyes: Conjunctivae clear, anicteric sclerae. Pupils are equal Nose: Nares normal. No drainage or  sinus tenderness. Throat: Lips, mucosa, and tongue normal. No Thrush Neck: symmetrical, no adenopathy, thyroid: non tender no carotid bruit and no JVD. Lungs: Clear to auscultation bilaterally. No Wheezing or Rhonchi. No rales. Heart: Regular rate and rhythm, no murmur, rub or gallop. Abdomen: Soft, non-tender,not distended. Bowel sounds normal. No masses Extremities: Extremities normal, atraumatic, no cyanosis. No edema. No clubbing Skin: No rashes or lesions. Not Jaundiced Lymph: Cervical, supraclavicular normal. Neurologic:numbness hands Health maintenance Vaccination Immunizations   Immunizations Name Administration Dates Next Due  [id number] VACCINE,MRNA(MODERNA)(PF)(IM) 11/27/2019, 10/28/2019    Hepatitis A 12/29/2010, 04/21/2010    Hepatitis B, Adult 05/10/2017, 04/21/2010, 11/04/2009, 07/08/2009    INFLUENZA INJ MDCK PF, QUAD,(FLUCELVAX)(51MO AND UP EGG FREE) 05/11/2020    INFLUENZA TIV (TRI) PF (IM) 06/23/2011, 04/21/2010, 07/08/2009    Influenza Vaccine Quad (IIV4 PF) 57mo+ injectable 05/02/2019, 05/07/2018, 05/10/2017, 05/05/2016, 10/01/2015, 08/28/2014, 05/02/2013, 06/28/2012    PNEUMOCOCCAL POLYSACCHARIDE 23 02/26/2015, 02/19/2009    PPD Test 12/29/2010, 11/04/2009    Pneumococcal Conjugate 13-Valent 03/27/2014    SHINGRIX-ZOSTER VACCINE (HZV), RECOMBINANT,SUB-UNIT,ADJUVANTED IM 02/04/2020, 08/06/2019    TdaP 02/19/2009    Tetanus-Diptheria Toxoids-TD(TDVAX),Asdorbed,2LF(IM) 08/06/2019      ______________________  Labs Lab Result  Date comment  HIV VL <20 01/28/21   CD4 925(54%) 01/28/21   Genotype     [id number]     HIV antibody     RPR NR 01/28/21   Quantiferon Gold NR 01/28/21   Hep C ab NR 01/28/21   Hepatitis B-ab,ag,c Sab 124 01/28/21   Hepatitis A-IgM, IgG /T     Lipid     GC/CHL NR-rectum throat 03/11/21   PAP     HB,PLT,Cr, LFT 13.9/181/1.26 02/10/21     Preventive  Procedure Result  Date comment  colonoscopy  06/20/2017   Mammogram     Dental exam      Opthal Y 2021 Multiple polyps- done at [facility]  05/25/19- US abdomen- no aneurysm of aorta 05/21/18 CT lung cancer  Impression/Recommendation HIV- - on Biktarvy- well controlled- Vl < 20 on 01/28/21 And cd4 is 927  HTN on lisinopril and HCTZ  Hyperlipidemia on atorvastatin Needs Lipid labs next blood draw  Anxiety/depression- zoloft, remeron  Spinal steosis- underwent laminectomies on 02/16/21- feeling much better Planning to have cervical spine surgery- has an appt with neurosurgeon today  Peripheral neuropathy Current smoker  Seborrheic warts scalp/trunk- saw Derm and had cryotherapy ? ___________________________________________________ Discussed with patient in detail.  Follow up 6 months- labs with next viist or before surgery

## 2021-07-15 NOTE — Patient Instructions (Signed)
You are here for follow up of HIV- you are well controlled on Biktarvy. Vl < 20 and cd4 > 800 Will follow in 6 months

## 2021-07-16 DIAGNOSIS — F4322 Adjustment disorder with anxiety: Secondary | ICD-10-CM | POA: Diagnosis not present

## 2021-07-19 ENCOUNTER — Other Ambulatory Visit: Payer: Self-pay | Admitting: Neurosurgery

## 2021-07-19 DIAGNOSIS — M5412 Radiculopathy, cervical region: Secondary | ICD-10-CM

## 2021-07-23 DIAGNOSIS — F4322 Adjustment disorder with anxiety: Secondary | ICD-10-CM | POA: Diagnosis not present

## 2021-07-28 DIAGNOSIS — F4322 Adjustment disorder with anxiety: Secondary | ICD-10-CM | POA: Diagnosis not present

## 2021-08-04 DIAGNOSIS — F4322 Adjustment disorder with anxiety: Secondary | ICD-10-CM | POA: Diagnosis not present

## 2021-08-05 ENCOUNTER — Ambulatory Visit: Payer: BC Managed Care – PPO | Admitting: Dermatology

## 2021-08-06 DIAGNOSIS — F4322 Adjustment disorder with anxiety: Secondary | ICD-10-CM | POA: Diagnosis not present

## 2021-08-10 ENCOUNTER — Other Ambulatory Visit: Payer: Self-pay

## 2021-08-10 ENCOUNTER — Ambulatory Visit
Admission: RE | Admit: 2021-08-10 | Discharge: 2021-08-10 | Disposition: A | Discharge status: Home / Self Care | Payer: BC Managed Care – PPO | Source: Ambulatory Visit | Attending: Neurosurgery | Admitting: Neurosurgery

## 2021-08-10 DIAGNOSIS — M5412 Radiculopathy, cervical region: Secondary | ICD-10-CM | POA: Diagnosis not present

## 2021-08-10 DIAGNOSIS — M542 Cervicalgia: Secondary | ICD-10-CM | POA: Diagnosis not present

## 2021-08-10 DIAGNOSIS — R200 Anesthesia of skin: Secondary | ICD-10-CM | POA: Diagnosis not present

## 2021-08-18 ENCOUNTER — Other Ambulatory Visit: Payer: Self-pay

## 2021-08-18 DIAGNOSIS — G2581 Restless legs syndrome: Secondary | ICD-10-CM

## 2021-08-18 MED ORDER — ROPINIROLE HCL 0.5 MG PO TABS: 0.5000 mg | ORAL_TABLET | Freq: Every day | ORAL | 2 refills | Status: DC

## 2021-08-19 DIAGNOSIS — M5412 Radiculopathy, cervical region: Secondary | ICD-10-CM | POA: Diagnosis not present

## 2021-08-30 DIAGNOSIS — G5603 Carpal tunnel syndrome, bilateral upper limbs: Secondary | ICD-10-CM | POA: Diagnosis not present

## 2021-09-08 ENCOUNTER — Encounter: Payer: BC Managed Care – PPO | Admitting: Podiatry

## 2021-09-13 ENCOUNTER — Other Ambulatory Visit: Payer: Self-pay

## 2021-09-13 ENCOUNTER — Encounter: Payer: Self-pay | Admitting: Podiatry

## 2021-09-13 ENCOUNTER — Ambulatory Visit (INDEPENDENT_AMBULATORY_CARE_PROVIDER_SITE_OTHER): Payer: BC Managed Care – PPO

## 2021-09-13 ENCOUNTER — Ambulatory Visit (INDEPENDENT_AMBULATORY_CARE_PROVIDER_SITE_OTHER): Payer: BC Managed Care – PPO | Admitting: Podiatry

## 2021-09-13 DIAGNOSIS — B351 Tinea unguium: Secondary | ICD-10-CM

## 2021-09-13 DIAGNOSIS — M79675 Pain in left toe(s): Secondary | ICD-10-CM | POA: Diagnosis not present

## 2021-09-13 DIAGNOSIS — M79674 Pain in right toe(s): Secondary | ICD-10-CM | POA: Diagnosis not present

## 2021-09-13 DIAGNOSIS — M722 Plantar fascial fibromatosis: Secondary | ICD-10-CM

## 2021-09-13 NOTE — Patient Instructions (Signed)

## 2021-09-14 ENCOUNTER — Ambulatory Visit (INDEPENDENT_AMBULATORY_CARE_PROVIDER_SITE_OTHER): Payer: BC Managed Care – PPO | Admitting: Dermatology

## 2021-09-14 ENCOUNTER — Encounter: Payer: Self-pay | Admitting: Dermatology

## 2021-09-14 DIAGNOSIS — B078 Other viral warts: Secondary | ICD-10-CM | POA: Diagnosis not present

## 2021-09-14 DIAGNOSIS — L219 Seborrheic dermatitis, unspecified: Secondary | ICD-10-CM

## 2021-09-14 DIAGNOSIS — L578 Other skin changes due to chronic exposure to nonionizing radiation: Secondary | ICD-10-CM | POA: Diagnosis not present

## 2021-09-14 DIAGNOSIS — L820 Inflamed seborrheic keratosis: Secondary | ICD-10-CM

## 2021-09-14 NOTE — Patient Instructions (Addendum)
Cryotherapy Aftercare  Wash gently with soap and water everyday.   Apply Vaseline and Band-Aid daily until healed.   Prior to procedure, discussed risks of blister formation, small wound, skin dyspigmentation, or rare scar following cryotherapy. Recommend Vaseline ointment to treated areas while healing.   Cantharidin is a blistering agent that comes from a beetle.  It needs to be washed off in about 4 hours after application.  Although it is painless when applied in office, it may cause symptoms of mild pain and burning several hours later.  Treated areas will swell and turn red, and blisters may form.  Vaseline and a bandaid may be applied until wound has healed.  Once healed, the skin may remain temporarily discolored.  It can take weeks to months for pigmentation to return to normal.  The molluscum may resolve with this topical treatment, but often, additional treatments may be required to clear molluscum.  It is recommended to keep the skin well-moisturized and avoid scratching affected area to help prevent spread of the molluscum.   Recommend using OTC dandruff shampoo. Head & Shoulders. Use as body wash, leave on 5-10 minutes.  Gentle Skin Care Guide  1. Bathe no more than once a day.  2. Avoid bathing in hot water  3. Use a mild soap like Dove, Vanicream, Cetaphil, CeraVe. Can use Lever 2000 or Cetaphil antibacterial soap  4. Use soap only where you need it. On most days, use it under your arms, between your legs, and on your feet. Let the water rinse other areas unless visibly dirty.  5. When you get out of the bath/shower, use a towel to gently blot your skin dry, don't rub it.  6. While your skin is still a little damp, apply a moisturizing cream such as Vanicream, CeraVe, Cetaphil, Eucerin, Sarna lotion or plain Vaseline Jelly. For hands apply Neutrogena [place] Hand Cream or Excipial Hand Cream.  7. Reapply moisturizer any time you start to itch or feel dry.  8. Sometimes  using free and clear laundry detergents can be helpful. Fabric softener sheets should be avoided. Downy Free & Gentle liquid, or any liquid fabric softener that is free of dyes and perfumes, it acceptable to use  9. If your doctor has given you prescription creams you may apply moisturizers over them    If You Need Anything After Your Visit  If you have any questions or concerns for your doctor, please call our main line at [phone] and press option 4 to reach your doctor's medical assistant. If no one answers, please leave a voicemail as directed and we will return your call as soon as possible. Messages left after 4 pm will be answered the following business day.   You may also send [place] a message via [place]. We typically respond to MyChart messages within 1-2 business days.  For prescription refills, please ask your pharmacy to contact our office. Our fax number is [phone].  If you have an urgent issue when the clinic is closed that cannot wait until the next business day, you can page your doctor at the number below.    Please note that while we do our best to be available for urgent issues outside of office hours, we are not available 24/7.   If you have an urgent issue and are unable to reach [place], you may choose to seek medical care at your doctor's office, retail clinic, urgent care center, or emergency room.  If you have a medical emergency, please  immediately call 911 or go to the emergency department.  Pager Numbers  - Dr. Nehemiah Massed: [phone]  - Dr. Laurence Ferrari: [phone]  - Dr. Nicole Kindred: [phone]  In the event of inclement weather, please call our main line at [phone] for an update on the status of any delays or closures.  Dermatology Medication Tips: Please keep the boxes that topical medications come in in order to help keep track of the instructions about where and how to use these. Pharmacies typically print the medication instructions only on the boxes  and not directly on the medication tubes.   If your medication is too expensive, please contact our office at [phone] option 4 or send [place] a message through [place].   We are unable to tell what your co-pay for medications will be in advance as this is different depending on your insurance coverage. However, we may be able to find a substitute medication at lower cost or fill out paperwork to get insurance to cover a needed medication.   If a prior authorization is required to get your medication covered by your insurance company, please allow [place] 1-2 business days to complete this process.  Drug prices often vary depending on where the prescription is filled and some pharmacies may offer cheaper prices.  The website www.goodrx.com contains coupons for medications through different pharmacies. The prices here do not account for what the cost may be with help from insurance (it may be cheaper with your insurance), but the website can give you the price if you did not use any insurance.  - You can print the associated coupon and take it with your prescription to the pharmacy.  - You may also stop by our office during regular business hours and pick up a GoodRx coupon card.  - If you need your prescription sent electronically to a different pharmacy, notify our office through [facility] or by phone at [phone] option 4.     Si Usted Necesita Algo Despus de Su Visita  Tambin puede enviarnos un mensaje a travs de Pharmacist, community. Por lo general respondemos a los mensajes de MyChart en el transcurso de 1 a 2 das hbiles.  Para renovar recetas, por favor pida a su farmacia que se ponga en contacto con nuestra oficina. Harland Dingwall de fax es [place] [phone].  Si tiene un asunto urgente cuando la clnica est cerrada y que no puede esperar hasta el siguiente da hbil, puede llamar/localizar a su doctor(a) al nmero que aparece a continuacin.   Por favor, tenga en cuenta que aunque  hacemos todo lo posible para estar disponibles para asuntos urgentes fuera del horario de [place], no estamos disponibles las 24 horas del da, los 7 das de la [place].   Si tiene un problema urgente y no puede comunicarse con nosotros, puede optar por buscar atencin mdica  en el consultorio de su doctor(a), en una clnica privada, en un centro de atencin urgente o en una sala de emergencias.  Si tiene Engineering geologist, por favor llame inmediatamente al 911 o vaya a la sala de emergencias.  Nmeros de bper  - Dr. Nehemiah Massed: [phone]  - Dra. Moye: [phone]  - Dra. Nicole Kindred: [phone]  En caso de inclemencias del [place], por favor llame a Johnsie Kindred principal al [phone] para una actualizacin sobre el [place] de cualquier retraso o cierre.  Consejos para la medicacin en dermatologa: Por favor, guarde las cajas en las que vienen los medicamentos de uso tpico para ayudarle a Designer, television/film set las  instrucciones sobre dnde y cmo usarlos. Las farmacias generalmente imprimen las instrucciones del medicamento slo en las cajas y no directamente en los tubos del [place].   Si su medicamento es muy caro, por favor, pngase en contacto con Zigmund Daniel llamando al [phone] y presione la opcin 4 o envenos un mensaje a travs de Pharmacist, community.   No podemos decirle cul ser su copago por los medicamentos por adelantado ya que esto es diferente dependiendo de la cobertura de su seguro. Sin embargo, es posible que podamos encontrar un medicamento sustituto a Electrical engineer un formulario para que el seguro cubra el medicamento que se considera necesario.   Si se requiere una autorizacin previa para que su compaa de seguros [place] su medicamento, por favor permtanos de 1 a 2 das hbiles para completar este proceso.  Los precios de los medicamentos varan con frecuencia dependiendo del Environmental consultant de dnde se surte la receta y alguna farmacias pueden ofrecer precios ms  baratos.  El sitio web www.goodrx.com tiene cupones para medicamentos de Airline pilot. Los precios aqu no tienen en cuenta lo que podra costar con la ayuda del seguro (puede ser ms barato con su seguro), pero el sitio web puede darle el precio si no utiliz Research scientist (physical sciences).  - Puede imprimir el cupn correspondiente y llevarlo con su receta a la farmacia.  - Tambin puede pasar por nuestra oficina durante el horario de atencin regular y Charity fundraiser una tarjeta de cupones de GoodRx.  - Si necesita que su receta se enve electrnicamente a una farmacia diferente, informe a nuestra oficina a travs de MyChart de [place] o por telfono llamando al [phone] y presione la opcin 4.

## 2021-09-14 NOTE — Progress Notes (Signed)
°  Subjective:  Patient ID: Daryl Shelton, male    DOB: Jul 03, 1961,  MRN: [id number]  Chief Complaint  Patient presents with   Foot Pain    "The Plantar Fasciitis on my right foot.  I have an ingrown toenail on my right big toe."    61 y.o. male presents with the above complaint. History confirmed with patient.  Started developing heel pain on his right foot a few weeks ago.  Also feels like the toenail on the right side is ingrown.  He took the Lamisil but does not know if it is changed much.  Objective:  Physical Exam: warm, good capillary refill, no trophic changes or ulcerative lesions, normal DP and PT pulses, and normal sensory exam. Left foot:dystrophic yellowed discolored nail plates with subungual debris Right Foot: point tenderness of the mid plantar fascia and dystrophic yellowed discolored nail plates with subungual debris  No images are attached to the encounter.  Radiographs: Multiple views x-ray of the right foot: no fracture, dislocation, swelling or degenerative changes noted, plantar calcaneal spur, and posterior calcaneal spur Assessment:   1. Plantar fasciitis of right foot      Plan:  Patient was evaluated and treated and all questions answered.  Discussed the etiology and treatment options for plantar fasciitis including stretching, formal physical therapy, supportive shoegears such as a running shoe or sneaker, pre fabricated orthoses, injection therapy, and oral medications. We also discussed the role of surgical treatment of this for patients who do not improve after exhausting non-surgical treatment options.   -XR reviewed with patient -Educated patient on stretching and icing of the affected limb -Home therapy plan dispensed he will work on this at home. -If not improving will consider injection therapy.  Discussed the etiology and treatment options for the condition in detail with the patient. Educated patient on the topical and oral treatment  options for mycotic nails. Recommended debridement of the nails today. Sharp and mechanical debridement performed of all painful and mycotic nails today. Nails debrided in length and thickness using a nail nipper to level of comfort. Discussed treatment options including appropriate shoe gear. Follow up as needed for painful nails.    Return if symptoms worsen or fail to improve.

## 2021-09-14 NOTE — Progress Notes (Signed)
Follow-Up Visit   Subjective  Daryl Shelton is a 61 y.o. male who presents for the following: AK (Recheck left frontal scalp. Tx with LN2 in November 2022), Warts (3 month recheck. Scalp, right hand, right arm. Tx with LN2 and Squaric acid 3% at last visit), and Rash (Chest. Dur: unsure. No hx of treatment. ).  The following portions of the chart were reviewed this encounter and updated as appropriate:  Tobacco   Allergies   Meds   Problems   Med Hx   Surg Hx   Fam Hx       Review of Systems: No other skin or systemic complaints except as noted in HPI or Assessment and Plan.   Objective  Well appearing patient in no apparent distress; mood and affect are within normal limits.  A focused examination was performed including scalp, face, neck, right arm, right hand. Relevant physical exam findings are noted in the Assessment and Plan.  Right Dorsal Wrist x1, right dorsal hand x2 (3), right frontal scalp x3 (3) Verrucous papules -- Discussed viral etiology and contagion.   Right Scalp Erythematous keratotic or waxy stuck-on papule or plaque.  Chest - Medial (Center) Pink patches with greasy scale.    Assessment & Plan  Other viral warts (6) Right Dorsal Wrist x1, right dorsal hand x2 (3); right frontal scalp x3 (3)  Discussed viral etiology and risk of spread.  Discussed multiple treatments may be required to clear warts.  Discussed possible post-treatment dyspigmentation and risk of recurrence.  Prior to procedure, discussed risks of blister formation, small wound, skin dyspigmentation, or rare scar following cryotherapy. Recommend Vaseline ointment to treated areas while healing.  Squaric Acid 3% applied to warts  at wrist and hand today. Prior to application reviewed risk of inflammation and irritation.  Cantharidin Plus is a blistering agent that comes from a beetle.  It needs to be washed off in about 4 hours after application.  Although it is painless when applied in  office, it may cause symptoms of mild pain and burning several hours later.  Treated areas will swell and turn red, and blisters may form.  Vaseline and a bandaid may be applied until wound has healed.  Once healed, the skin may remain temporarily discolored.  It can take weeks to months for pigmentation to return to normal.  Advised to wash off with soap and water in 4 hours or sooner if it becomes tender before then.   Destruction of lesion - Right Dorsal Wrist x1, right dorsal hand x2, right frontal scalp x3  Destruction method: cryotherapy   Informed consent: discussed and consent obtained   Lesion destroyed using liquid nitrogen: Yes   Outcome: patient tolerated procedure well with no complications   Post-procedure details: wound care instructions given    Destruction of lesion - Right Dorsal Wrist x1, right dorsal hand x2  Destruction method: chemical removal   Informed consent: discussed and consent obtained   Timeout:  patient name, date of birth, surgical site, and procedure verified Chemical destruction method: cantharidin   Chemical destruction method comment:  Plus Procedure instructions: patient instructed to wash and dry area   Procedure instructions comment:  Wash off in 4 hours Outcome: patient tolerated procedure well with no complications   Post-procedure details: wound care instructions given   Additional details:  Squaric acid 3% applied  Inflamed seborrheic keratosis Right Scalp  - Benign-appearing - Discussed benign etiology and prognosis. - Observe - Call for any changes  Patient deferred treatment at this time.   Seborrheic dermatitis Chest - Medial (Center)  Recommend using OTC dandruff shampoo, Head & Shoulders. Use as body wash for chest, leave on 5-10 minutes.  Patient defers Rx treatment today  Seborrheic Dermatitis  -  is a chronic persistent rash characterized by pinkness and scaling most commonly of the mid face but also can occur on the scalp  (dandruff), ears; mid chest, mid back and groin.  It tends to be exacerbated by stress and cooler weather.  People who have neurologic disease may experience new onset or exacerbation of existing seborrheic dermatitis.  The condition is not curable but treatable and can be controlled.     Actinic Damage - chronic, secondary to cumulative UV radiation exposure/sun exposure over time - diffuse scaly erythematous macules with underlying dyspigmentation - Recommend daily broad spectrum sunscreen SPF 30+ to sun-exposed areas, reapply every 2 hours as needed.  - Recommend staying in the shade or wearing long sleeves, sun glasses (UVA+UVB protection) and wide brim hats (4-inch brim around the entire circumference of the hat). - Call for new or changing lesions.   Return for Wart Follow UP 4-6 weeks.  I, Emelia Salisbury, CMA, am acting as scribe for Forest Gleason, MD.  Documentation: I have reviewed the above documentation for accuracy and completeness, and I agree with the above.  Forest Gleason, MD

## 2021-09-22 ENCOUNTER — Encounter: Payer: Self-pay | Admitting: Dermatology

## 2021-09-24 ENCOUNTER — Other Ambulatory Visit: Payer: Self-pay

## 2021-09-24 ENCOUNTER — Ambulatory Visit (INDEPENDENT_AMBULATORY_CARE_PROVIDER_SITE_OTHER): Payer: Self-pay | Admitting: Family Medicine

## 2021-09-24 ENCOUNTER — Encounter: Payer: Self-pay | Admitting: Family Medicine

## 2021-09-24 VITALS — BP 115/75 | HR 91 | Ht 70.0 in | Wt 242.2 lb

## 2021-09-24 DIAGNOSIS — F411 Generalized anxiety disorder: Secondary | ICD-10-CM

## 2021-09-24 DIAGNOSIS — E669 Obesity, unspecified: Secondary | ICD-10-CM

## 2021-09-24 DIAGNOSIS — R7309 Other abnormal glucose: Secondary | ICD-10-CM

## 2021-09-24 DIAGNOSIS — F3341 Major depressive disorder, recurrent, in partial remission: Secondary | ICD-10-CM

## 2021-09-24 DIAGNOSIS — N521 Erectile dysfunction due to diseases classified elsewhere: Secondary | ICD-10-CM

## 2021-09-24 LAB — POCT GLYCOSYLATED HEMOGLOBIN (HGB A1C): Hemoglobin A1C: 5.3 % (ref 4.0–5.6)

## 2021-09-24 MED ORDER — TADALAFIL 10 MG PO TABS
10.0000 mg | ORAL_TABLET | ORAL | 5 refills | Status: DC | PRN
Start: 2021-09-24 — End: 2023-01-04

## 2021-09-24 MED ORDER — WEGOVY 0.25 MG/0.5ML ~~LOC~~ SOAJ: 0.2500 mg | SUBCUTANEOUS | 0 refills | Status: DC

## 2021-09-24 MED ORDER — MIRTAZAPINE 7.5 MG PO TABS: 7.5000 mg | ORAL_TABLET | Freq: Every day | ORAL | 2 refills | Status: DC

## 2021-09-24 NOTE — Patient Instructions (Addendum)
Thank you for coming to the office today.  Reduced Mirtazapine from 30mg  to 7.5mg  pill, take whole for now and then phase down to half or space out to discontinue.  Venous Stasis Dermatitis   Use RICE therapy: - R - Rest / relative rest with activity modification avoid overuse of joint - I - Ice packs (make sure you use a towel or sock / something to protect skin) - C - Compression with stockings/socks and ACE wrap (during the day or afternoon) to apply pressure and reduce swelling allowing more support - E - Elevation (preferred at night time ) - if significant swelling, lift leg above heart level (toes above your nose) to help reduce swelling, most helpful at night after day of being on your feet  Continue to stay active with both legs as well, if you do pedal pump or keep legs active while seated that can help!  Gravity is what is keeping the fluid down in lower extremity causes the skin changes.  -----------------------------------------  Call insurance find cost and coverage of the following - check the following: - Drug Tier, Preferred List, On Formulary - All will require a "Prior Authorization" from [place] first, before you can find out the cost - Find out if there is "Step Therapy" (other medicines required before you can try these)  Once you pick the one you want to try, let me know - we can get a sample ready IF we have it in stock. Then try it - and before running out of medicine, contact me back to order your Rx so we have time to get it processed.  For Pre-Diabetes / Diabetes   1. Ozempic (Semaglutide injection) - start 0.25mg  weekly for 4 weeks then increase to 0.5mg  weekly, sample is 6 doses, re-use the same pen until empty, new needle each dose.   2. Trulicity (Dulaglutide) - once weekly 0.75 (likely we would start) and 1.5 max dose, sample is 2 doses, 1 dose per pen, each pen is a one time use, no visible needle, it is an auto-injector.  ----------  For Weight Loss /  Obesity only  Wegovy (same as Ozempic) weekly injection - start 0.25mg  weekly, 1 dose per pen, single use, auto-injector  2. Saxenda - DAILY injection - start 0.6mg  injection DAILY, sample is 3 weeks, new needle each dose.   3 benefits - 1 significantly reduced A1c sugar, and may be able to reduce or stop metformin in future - 2 reduced appetite and weight loss with good results - 3 cardiovascular risk reduction, less likely to have heart attack/stroke    ---------------------------------------------  Tadalafil generic Cialis  DUE for FASTING BLOOD WORK (no food or drink after midnight before the lab appointment, only water or coffee without cream/sugar on the morning of)  SCHEDULE "Lab Only" visit in the morning at the clinic for lab draw in 3 MONTHS   - Make sure Lab Only appointment is at about 1 week before your next appointment, so that results will be available  For Lab Results, once available within 2-3 days of blood draw, you can can log in to MyChart online to view your results and a brief explanation. Also, we can discuss results at next follow-up visit.    Please schedule a Follow-up Appointment to: Return in about 3 months (around 12/22/2021) for 3 month Annual Physical AM apt fasting lab AFTER.  If you have any other questions or concerns, please feel free to call the office or send a  message through [facility]. You may also schedule an earlier appointment if necessary.  Additionally, you may be receiving a survey about your experience at our office within a few days to 1 week by e-mail or mail. We value your feedback.  Nobie Putnam, DO [facility], [place]  Chronic Venous Insufficiency Chronic venous insufficiency is a condition where the leg veins cannot effectively pump blood from the legs to the heart. This happens when the vein walls are either stretched, weakened, or damaged, or when the valves inside the vein are damaged. With the right  treatment, you should be able to continue with an active life. This condition is also called venous stasis. What are the causes? Common causes of this condition include: High blood pressure inside the veins (venous hypertension). Sitting or standing too long, causing increased blood pressure in the leg veins. A blood clot that blocks blood flow in a vein (deep vein thrombosis, DVT). Inflammation of a vein (phlebitis) that causes a blood clot to form. Tumors in the pelvis that cause blood to back up. What increases the risk? The following factors may make you more likely to develop this condition: Having a family history of this condition. Obesity. Pregnancy. Living without enough regular physical activity or exercise (sedentary lifestyle). Smoking. Having a job that requires long periods of standing or sitting in one place. Being a certain age. Women in their 42s and 6s and men in their 54s are more likely to develop this condition. What are the signs or symptoms? Symptoms of this condition include: Veins that are enlarged, bulging, or twisted (varicose veins). Skin breakdown or ulcers. Reddened skin or dark discoloration of skin on the leg between the knee and ankle. Brown, smooth, tight, and painful skin just above the ankle, usually on the inside of the leg (lipodermatosclerosis). Swelling of the legs. How is this diagnosed? This condition may be diagnosed based on: Your medical history. A physical exam. Tests, such as: A procedure that creates an image of a blood vessel and nearby organs and provides information about blood flow through the blood vessel (duplex ultrasound). A procedure that tests blood flow (plethysmography). A procedure that looks at the veins using X-ray and dye (venogram). How is this treated? The goals of treatment are to help you return to an active life and to minimize pain or disability. Treatment depends on the severity of your condition, and it may  include: Wearing compression stockings. These can help relieve symptoms and help prevent your condition from getting worse. However, they do not cure the condition. Sclerotherapy. This procedure involves an injection of a solution that shrinks damaged veins. Surgery. This may involve: Removing a diseased vein (vein stripping). Cutting off blood flow through the vein (laser ablation surgery). Repairing or reconstructing a valve within the affected vein. Follow these instructions at home:   Wear compression stockings as told by your health care provider. These stockings help to prevent blood clots and reduce swelling in your legs. Take over-the-counter and prescription medicines only as told by your health care provider. Stay active by exercising, walking, or doing different activities. Ask your health care provider what activities are safe for you and how much exercise you need. Drink enough fluid to keep your urine pale yellow. Do not use any products that contain nicotine or tobacco, such as cigarettes, e-cigarettes, and chewing tobacco. If you need help quitting, ask your health care provider. Keep all follow-up visits as told by your health care provider. This is  important. Contact a health care provider if you: Have redness, swelling, or more pain in the affected area. See a red streak or line that goes up or down from the affected area. Have skin breakdown or skin loss in the affected area, even if the breakdown is small. Get an injury in the affected area. Get help right away if: You get an injury and an open wound in the affected area. You have: Severe pain that does not get better with medicine. Sudden numbness or weakness in the foot or ankle below the affected area. Trouble moving your foot or ankle. A fever. Worse or persistent symptoms. Chest pain. Shortness of breath. Summary Chronic venous insufficiency is a condition where the leg veins cannot effectively pump blood from  the legs to the heart. Chronic venous insufficiency occurs when the vein walls become stretched, weakened, or damaged, or when valves within the vein are damaged. Treatment depends on how severe your condition is. It often involves wearing compression stockings and may involve having a procedure. Make sure you stay active by exercising, walking, or doing different activities. Ask your health care provider what activities are safe for you and how much exercise you need. This information is not intended to replace advice given to you by your health care provider. Make sure you discuss any questions you have with your health care provider. Document Revised: 10/06/2020 Document Reviewed: 10/06/2020 Elsevier Patient Education  [place].

## 2021-09-24 NOTE — Progress Notes (Signed)
Subjective:    Patient ID: Daryl Shelton, male    DOB: 1960-08-10, 61 y.o.   MRN: [id number]  Daryl Shelton is a 61 y.o. male presenting on 09/24/2021 for Leg Swelling and Obesity   HPI  Lumbar DDD S/p Spinal surgery Lumbar Laminectomy 02/16/21 Dr Dawley He had complications with numbness from waist down, has improved. - He also has some cervical spine DDD Current question regarding neuropathy  He had carpal tunnel surgery but cancelled   Alcohol Abuse history Neuropathy + RLS Abstained now from alcohol since 09/22/20, he was drinking daily for 30 years Prior history  R worse than Left. Both burning and also has RLS. 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 Gabapentin 900mg  TID (600 mg tab + half = 900)   Weight Gain Remeron 30mg , he is aware of side effect wt gain Eats ice cream more regularly, worse after quit alcohol, did lose some weight after. He bought a bicycle the other day  Goal for weight loss with improve   ED On Cialis PRN   Major Depression recurrent Generalized Anxiety Hx Slight Agoraphobia Working with therapist Hardie Shackleton History of childhood trauma, contributed  On Sertraline 100mg  daily and Remeron 30mg  daily He had cut dose of remeron in half before, but he was worried about feeling anxious not being able to sleep in past. Has tried Trazodone before  Reduce Mirtazapine further from 15 down to 7.5mg  new rx will DC   HIV, chronic, controlled and undetectable Followed by Dr Delaine Lame ([facility]) On Biktarvy   CHRONIC HTN: Reports no concerns Current Meds - Lisinopril 40mg  daily, HCTZ 25mg  daily   Reports good compliance, took meds today. Tolerating well, w/o complaints. Denies CP, dyspnea, HA, edema, dizziness / lightheadedness  Venous Stasis Dermatits / Insufficiency Improved with elevation  Depression screen [facility] 2/9 05/28/2021  Decreased Interest 0  Down, Depressed, Hopeless 1  PHQ - 2 Score 1  Altered sleeping 0   Tired, decreased energy 1  Change in appetite 1  Feeling bad or failure about yourself  0  Trouble concentrating 0  Moving slowly or fidgety/restless 0  Suicidal thoughts 0  PHQ-9 Score 3  Difficult doing work/chores Not difficult at all    Social History   Tobacco Use   Smoking status: Every Day    Packs/day: 0.50    Years: 40.00    Pack years: 20.00    Types: Cigarettes   Smokeless tobacco: Never  Vaping Use   Vaping Use: Never used  Substance Use Topics   Alcohol use: Not Currently   Drug use: No    Review of Systems Per HPI unless specifically indicated above     Objective:    BP 115/75    Pulse 91    Ht 5\' 10"  (1.778 m)    Wt 242 lb 3.2 oz (109.9 kg)    SpO2 97%    BMI 34.75 kg/m   Wt Readings from Last 3 Encounters:  09/24/21 242 lb 3.2 oz (109.9 kg)  07/15/21 238 lb (108 kg)  06/21/21 239 lb (108.4 kg)    Physical Exam Vitals and nursing note reviewed.  Constitutional:      General: He is not in acute distress.    Appearance: Normal appearance. He is well-developed. He is not diaphoretic.     Comments: Well-appearing, comfortable, cooperative  HENT:     Head: Normocephalic and atraumatic.  Eyes:     General:  Right eye: No discharge.        Left eye: No discharge.     Conjunctiva/sclera: Conjunctivae normal.  Cardiovascular:     Rate and Rhythm: Normal rate.  Pulmonary:     Effort: Pulmonary effort is normal.  Musculoskeletal:     Right lower leg: Edema (trace +1 but some venous stasis dermatitis bilateral) present.     Left lower leg: Edema present.  Skin:    General: Skin is warm and dry.     Findings: No erythema or rash.  Neurological:     Mental Status: He is alert and oriented to person, place, and time.  Psychiatric:        Mood and Affect: Mood normal.        Behavior: Behavior normal.        Thought Content: Thought content normal.     Comments: Well groomed, good eye contact, normal speech and thoughts     Results for  orders placed or performed in visit on 09/24/21  POCT glycosylated hemoglobin (Hb A1C)  Result Value Ref Range   Hemoglobin A1C 5.3 4.0 - 5.6 %   HbA1c POC (<> result, manual entry)     HbA1c, POC (prediabetic range)     HbA1c, POC (controlled diabetic range)        Assessment & Plan:   Problem List Items Addressed This Visit     Major depressive disorder, recurrent, in partial remission ([place]) - Primary   Relevant Medications   mirtazapine (REMERON) 7.5 MG tablet   GAD (generalized anxiety disorder)   Relevant Medications   mirtazapine (REMERON) 7.5 MG tablet   Erectile dysfunction due to diseases classified elsewhere   Relevant Medications   tadalafil (CIALIS) 10 MG tablet   Other Visit Diagnoses     Obesity (BMI 30.0-34.9)       Relevant Medications   WEGOVY 0.25 MG/0.5ML SOAJ   Abnormal glucose       Relevant Orders   POCT glycosylated hemoglobin (Hb A1C) (Completed)       Obesity Weight management - start Wegovy sample, info on AVS if not covered consider Saxenda  Check A1c POC. Result is 5.3 normal range. Not preDM  Reduce Mirtazapine from 15 to 7.5 and further to DC  Venous stasis RICE therapy advised Future vascular if indicated  ED Re order Tadalafil, use goodrx Future Urology consult if indicated   Meds ordered this encounter  Medications   mirtazapine (REMERON) 7.5 MG tablet    Sig: Take 1 tablet (7.5 mg total) by mouth at bedtime. May taper and discontinue when ready    Dispense:  30 tablet    Refill:  2   WEGOVY 0.25 MG/0.5ML SOAJ    Sig: Inject 0.25 mg into the skin once a week.    Dispense:  2 mL    Refill:  0   tadalafil (CIALIS) 10 MG tablet    Sig: Take 1 tablet (10 mg total) by mouth every other day as needed for erectile dysfunction.    Dispense:  90 tablet    Refill:  5      Follow up plan: Return in about 3 months (around 12/22/2021) for 3 month Annual Physical AM apt fasting lab AFTER.    Nobie Putnam, DO [facility] 09/24/2021, 2:08 PM

## 2021-09-25 ENCOUNTER — Encounter: Payer: Self-pay | Admitting: Family Medicine

## 2021-09-25 DIAGNOSIS — E669 Obesity, unspecified: Secondary | ICD-10-CM

## 2021-10-12 ENCOUNTER — Telehealth: Payer: Self-pay

## 2021-10-12 NOTE — Telephone Encounter (Signed)
LVM for pt to call to reschedule appt with Dr. Jerilynn Mages ?Lurlean Horns., RMA ?

## 2021-10-13 ENCOUNTER — Other Ambulatory Visit: Payer: Self-pay

## 2021-10-13 ENCOUNTER — Ambulatory Visit (INDEPENDENT_AMBULATORY_CARE_PROVIDER_SITE_OTHER): Payer: BC Managed Care – PPO | Admitting: Dermatology

## 2021-10-13 DIAGNOSIS — L578 Other skin changes due to chronic exposure to nonionizing radiation: Secondary | ICD-10-CM | POA: Diagnosis not present

## 2021-10-13 DIAGNOSIS — L820 Inflamed seborrheic keratosis: Secondary | ICD-10-CM | POA: Diagnosis not present

## 2021-10-13 DIAGNOSIS — B079 Viral wart, unspecified: Secondary | ICD-10-CM | POA: Diagnosis not present

## 2021-10-13 DIAGNOSIS — L821 Other seborrheic keratosis: Secondary | ICD-10-CM

## 2021-10-13 DIAGNOSIS — Z872 Personal history of diseases of the skin and subcutaneous tissue: Secondary | ICD-10-CM | POA: Diagnosis not present

## 2021-10-13 NOTE — Progress Notes (Deleted)
? ?Follow-Up Visit ?  ?Subjective  ?LENOX BINK is a 61 y.o. male who presents for the following: Warts (On the R dorsal hand x 2, R wrist, and R frontal scalp x 2 - previously tx with LN2 and cantherone. Patient did get a blister at sites. ) and Irregular skin lesion (In the groin area, growing larger, unsightly to patient. He has a photo he would like to show today. ). ? ? ?The following portions of the chart were reviewed this encounter and updated as appropriate:  ?  ?  ? ?Review of Systems:  No other skin or systemic complaints except as noted in HPI or Assessment and Plan. ? ?Objective  ?Well appearing patient in no apparent distress; mood and affect are within normal limits. ? ?A focused examination was performed including the face, hands, and groin area. Relevant physical exam findings are noted in the Assessment and Plan. ? ?R dorsal wrist x 1, R dorsum hand x 1, R thumb webspace x 1 (3) ?Verrucous papules -- Discussed viral etiology and contagion. 0.4 cm on the R dorsal hand, R wrist 0.3 cm, R thumb webspace 0.3cm. ? ?R scalp supra auricular x 3, L supra pubic x 1 (4) ?Erythematous stuck-on, waxy papule or plaque 1.2 cm ? ?Scalp ?Clear.  ? ? ? ?Assessment & Plan  ?Viral warts, unspecified type (3) ?R dorsal wrist x 1, R dorsum hand x 1, R thumb webspace x 1 ? ?Discussed viral etiology and risk of spread.  Discussed multiple treatments may be required to clear warts.  Discussed possible post-treatment dyspigmentation and risk of recurrence. ? ?Destruction of lesion - R dorsal wrist x 1, R dorsum hand x 1, R thumb webspace x 1 ?Complexity: simple   ?Destruction method: cryotherapy   ?Informed consent: discussed and consent obtained   ?Timeout:  patient name, date of birth, surgical site, and procedure verified ?Lesion destroyed using liquid nitrogen: Yes   ?Region frozen until ice ball extended beyond lesion: Yes   ?Outcome: patient tolerated procedure well with no complications   ?Post-procedure  details: wound care instructions given   ? ?Inflamed seborrheic keratosis (4) ?R scalp supra auricular x 3, L supra pubic x 1 ? ?Destruction of lesion - R scalp supra auricular x 3, L supra pubic x 1 ?Complexity: simple   ?Destruction method: cryotherapy   ?Informed consent: discussed and consent obtained   ?Timeout:  patient name, date of birth, surgical site, and procedure verified ?Lesion destroyed using liquid nitrogen: Yes   ?Region frozen until ice ball extended beyond lesion: Yes   ?Outcome: patient tolerated procedure well with no complications   ?Post-procedure details: wound care instructions given   ? ?History of actinic keratosis ?Scalp ? ?Clear. Observe for recurrence. Call clinic for new or changing lesions.  Recommend regular skin exams, daily broad-spectrum spf 30+ sunscreen use, and photoprotection.   ? ? ? ?Actinic Damage ?- chronic, secondary to cumulative UV radiation exposure/sun exposure over time ?- diffuse scaly erythematous macules with underlying dyspigmentation ?- Recommend daily broad spectrum sunscreen SPF 30+ to sun-exposed areas, reapply every 2 hours as needed.  ?- Recommend staying in the shade or wearing long sleeves, sun glasses (UVA+UVB protection) and wide brim hats (4-inch brim around the entire circumference of the hat). ?- Call for new or changing lesions. ? ?Seborrheic Keratoses ?- Stuck-on, waxy, tan-brown papules and/or plaques  ?- Benign-appearing ?- Discussed benign etiology and prognosis. ?- Observe ?- Call for any changes ? ?Return in about  8 weeks (around 12/08/2021). ? ?I, Rudell Cobb, CMA, am acting as scribe for Sarina Ser, MD . ? ?

## 2021-10-13 NOTE — Patient Instructions (Signed)

## 2021-10-13 NOTE — Progress Notes (Signed)
? ?Follow-Up Visit ?  ?Subjective  ?Daryl Shelton is a 61 y.o. male who presents for the following: Warts (On the R dorsal hand x 2, R wrist, and R frontal scalp x 2 - previously tx with LN2 and cantherone. Patient did get a blister at sites. ) and Irregular skin lesion (In the groin area, growing larger, unsightly to patient. He has a photo he would like to show today. ). ? ?The following portions of the chart were reviewed this encounter and updated as appropriate:  ? Tobacco  Allergies  Meds  Problems  Med Hx  Surg Hx  Fam Hx   ?  ?Review of Systems:  No other skin or systemic complaints except as noted in HPI or Assessment and Plan. ? ?Objective  ?Well appearing patient in no apparent distress; mood and affect are within normal limits. ? ?A focused examination was performed including hands scalp arms face. Relevant physical exam findings are noted in the Assessment and Plan. ? ?R dorsal wrist x 1, R dorsum hand x 1, R thumb webspace x 1 (3) ?Verrucous papules -- Discussed viral etiology and contagion. 0.4 cm on the R dorsal hand, R wrist 0.3 cm, R thumb webspace 0.3cm. ? ?R scalp supra auricular x 3, L supra pubic x 1 (4) ?Erythematous stuck-on, waxy papule or plaque 1.2 cm ? ?Scalp ?Clear.  ? ? ?Assessment & Plan  ?Viral warts, unspecified type (3) ?R dorsal wrist x 1, R dorsum hand x 1, R thumb webspace x 1 ?Discussed viral etiology and risk of spread.  Discussed multiple treatments may be required to clear warts.  Discussed possible post-treatment dyspigmentation and risk of recurrence. ? ?Destruction of lesion - R dorsal wrist x 1, R dorsum hand x 1, R thumb webspace x 1 ?Complexity: simple   ?Destruction method: cryotherapy   ?Informed consent: discussed and consent obtained   ?Timeout:  patient name, date of birth, surgical site, and procedure verified ?Lesion destroyed using liquid nitrogen: Yes   ?Region frozen until ice ball extended beyond lesion: Yes   ?Outcome: patient tolerated procedure  well with no complications   ?Post-procedure details: wound care instructions given   ? ?Inflamed seborrheic keratosis (4) ?R scalp supra auricular x 3, L supra pubic x 1 ?Destruction of lesion - R scalp supra auricular x 3, L supra pubic x 1 ?Complexity: simple   ?Destruction method: cryotherapy   ?Informed consent: discussed and consent obtained   ?Timeout:  patient name, date of birth, surgical site, and procedure verified ?Lesion destroyed using liquid nitrogen: Yes   ?Region frozen until ice ball extended beyond lesion: Yes   ?Outcome: patient tolerated procedure well with no complications   ?Post-procedure details: wound care instructions given   ? ?History of actinic keratosis ?Scalp ?Clear. Observe for recurrence. Call clinic for new or changing lesions.  Recommend regular skin exams, daily broad-spectrum spf 30+ sunscreen use, and photoprotection.   ? ?Actinic Damage ?- chronic, secondary to cumulative UV radiation exposure/sun exposure over time ?- diffuse scaly erythematous macules with underlying dyspigmentation ?- Recommend daily broad spectrum sunscreen SPF 30+ to sun-exposed areas, reapply every 2 hours as needed.  ?- Recommend staying in the shade or wearing long sleeves, sun glasses (UVA+UVB protection) and wide brim hats (4-inch brim around the entire circumference of the hat). ?- Call for new or changing lesions. ? ?Seborrheic Keratoses ?- Stuck-on, waxy, tan-brown papules and/or plaques  ?- Benign-appearing ?- Discussed benign etiology and prognosis. ?- Observe ?- Call  for any changes ? ?Return in about 8 weeks (around 12/08/2021). ?Documentation: I have reviewed the above documentation for accuracy and completeness, and I agree with the above. ? ?Sarina Ser, MD ? ? ?

## 2021-10-17 ENCOUNTER — Encounter: Payer: Self-pay | Admitting: Dermatology

## 2021-10-18 MED ORDER — CONTRAVE 8-90 MG PO TB12: ORAL_TABLET | ORAL | 0 refills | Status: DC

## 2021-10-18 NOTE — Addendum Note (Signed)
Addended by: Olin Hauser on: 10/18/2021 05:52 PM ? ? Modules accepted: Orders ? ?

## 2021-10-25 ENCOUNTER — Encounter: Payer: Self-pay | Admitting: Family Medicine

## 2021-11-05 ENCOUNTER — Other Ambulatory Visit: Payer: Self-pay | Admitting: Family Medicine

## 2021-11-05 DIAGNOSIS — Z0000 Encounter for general adult medical examination without abnormal findings: Secondary | ICD-10-CM

## 2021-11-05 DIAGNOSIS — I10 Essential (primary) hypertension: Secondary | ICD-10-CM

## 2021-11-05 DIAGNOSIS — Z125 Encounter for screening for malignant neoplasm of prostate: Secondary | ICD-10-CM

## 2021-11-05 DIAGNOSIS — R7309 Other abnormal glucose: Secondary | ICD-10-CM

## 2021-11-05 DIAGNOSIS — E781 Pure hyperglyceridemia: Secondary | ICD-10-CM

## 2021-11-05 DIAGNOSIS — F3341 Major depressive disorder, recurrent, in partial remission: Secondary | ICD-10-CM

## 2021-11-05 DIAGNOSIS — E669 Obesity, unspecified: Secondary | ICD-10-CM

## 2021-11-16 ENCOUNTER — Encounter: Payer: Self-pay | Admitting: Family Medicine

## 2021-11-16 DIAGNOSIS — G2581 Restless legs syndrome: Secondary | ICD-10-CM

## 2021-11-16 MED ORDER — ROPINIROLE HCL 1 MG PO TABS: 1.0000 mg | ORAL_TABLET | Freq: Every day | ORAL | 1 refills | Status: DC

## 2021-11-18 DIAGNOSIS — F4322 Adjustment disorder with anxiety: Secondary | ICD-10-CM | POA: Diagnosis not present

## 2021-11-20 DIAGNOSIS — Z720 Tobacco use: Secondary | ICD-10-CM | POA: Diagnosis not present

## 2021-11-24 DIAGNOSIS — F4322 Adjustment disorder with anxiety: Secondary | ICD-10-CM | POA: Diagnosis not present

## 2021-11-29 ENCOUNTER — Other Ambulatory Visit: Payer: Self-pay | Admitting: Family Medicine

## 2021-11-29 DIAGNOSIS — I10 Essential (primary) hypertension: Secondary | ICD-10-CM

## 2021-11-29 DIAGNOSIS — E669 Obesity, unspecified: Secondary | ICD-10-CM

## 2021-12-01 ENCOUNTER — Encounter: Payer: Self-pay | Admitting: Family Medicine

## 2021-12-01 MED ORDER — CONTRAVE 8-90 MG PO TB12: 2.0000 | ORAL_TABLET | Freq: Two times a day (BID) | ORAL | 2 refills | Status: DC

## 2021-12-01 NOTE — Telephone Encounter (Signed)
Requested medication (s) are due for refill today: Lisinopril, amount not specified    HCTZ  due 12/15/21 ? ?Requested medication (s) are on the active medication list: yes   ? ?Last refill: Lisinopril 01/27/21  Amount not specified   HCTZ 12/15/20  #90  3 refills ? ?Future visit scheduled yes  12/29/21 ? ?Notes to clinic:Historical provider. Please review. ? ?Requested Prescriptions  ?Pending Prescriptions Disp Refills  ? lisinopril (ZESTRIL) 40 MG tablet [Pharmacy Med Name: LISINOPRIL '40MG'$  TABLETS] 90 tablet   ?  Sig: TAKE 1 TABLET(40 MG) BY MOUTH DAILY  ?  ? There is no refill protocol information for this order  ?  ? hydrochlorothiazide (HYDRODIURIL) 25 MG tablet [Pharmacy Med Name: HYDROCHLOROTHIAZIDE '25MG'$  TABLETS] 90 tablet 3  ?  Sig: TAKE 1 TABLET(25 MG) BY MOUTH DAILY  ?  ? There is no refill protocol information for this order  ?  ? ? ? ? ?

## 2021-12-02 DIAGNOSIS — F4322 Adjustment disorder with anxiety: Secondary | ICD-10-CM | POA: Diagnosis not present

## 2021-12-08 ENCOUNTER — Ambulatory Visit (INDEPENDENT_AMBULATORY_CARE_PROVIDER_SITE_OTHER): Payer: BC Managed Care – PPO | Admitting: Dermatology

## 2021-12-08 DIAGNOSIS — L738 Other specified follicular disorders: Secondary | ICD-10-CM | POA: Diagnosis not present

## 2021-12-08 DIAGNOSIS — B079 Viral wart, unspecified: Secondary | ICD-10-CM | POA: Diagnosis not present

## 2021-12-08 DIAGNOSIS — L820 Inflamed seborrheic keratosis: Secondary | ICD-10-CM | POA: Diagnosis not present

## 2021-12-08 DIAGNOSIS — F4322 Adjustment disorder with anxiety: Secondary | ICD-10-CM | POA: Diagnosis not present

## 2021-12-08 NOTE — Progress Notes (Deleted)
? ?  Follow-Up Visit ?  ?Subjective  ?Daryl Shelton is a 61 y.o. male who presents for the following: Warts. ? ? ? ?The following portions of the chart were reviewed this encounter and updated as appropriate:  ?  ?  ? ?Review of Systems:  No other skin or systemic complaints except as noted in HPI or Assessment and Plan. ? ?Objective  ?Well appearing patient in no apparent distress; mood and affect are within normal limits. ? ?A focused examination was performed including face,hands. Relevant physical exam findings are noted in the Assessment and Plan. ? ?face,fingers ?Verrucous papules -- Discussed viral etiology and contagion.  ? ?right scalp supra auricular, right forehead  (2) (2) ?Stuck-on, waxy, tan-brown papules--Discussed benign etiology and prognosis.  ? ?face ?Yellowish papule  ? ? ? ?Assessment & Plan  ?Viral warts, unspecified type ?face,fingers ? ?Discussed viral etiology and risk of spread.  Discussed multiple treatments may be required to clear warts.  Discussed possible post-treatment dyspigmentation and risk of recurrence.  ? ?Inflamed seborrheic keratosis (2) ?right scalp supra auricular, right forehead  (2) ? ?Sebaceous hyperplasia ?face ? ?Benign-appearing.  Observation.  Call clinic for new or changing lesions.  Recommend daily use of broad spectrum spf 30+ sunscreen to sun-exposed areas.   ? ? ?Return in about 3 months (around 03/10/2022) for warts. ? ?I, Marye Round, CMA, am acting as scribe for Sarina Ser, MD .  ?

## 2021-12-08 NOTE — Progress Notes (Signed)
? ?  Follow-Up Visit ?  ?Subjective  ?Daryl Shelton is a 61 y.o. male who presents for the following: Warts. ?The patient has spots, moles and lesions to be evaluated, some may be new or changing and the patient has concerns that these could be cancer. ? ?The following portions of the chart were reviewed this encounter and updated as appropriate:  ? Tobacco  Allergies  Meds  Problems  Med Hx  Surg Hx  Fam Hx   ?  ?Review of Systems:  No other skin or systemic complaints except as noted in HPI or Assessment and Plan. ? ?Objective  ?Well appearing patient in no apparent distress; mood and affect are within normal limits. ? ?A focused examination was performed including face,hands,fingers. Relevant physical exam findings are noted in the Assessment and Plan. ? ?R dorsal wrist , R dorsum hand,  R thumb webspace ?Clear  ? ?right scalp supra auricular, right forehead  (2) (2) ?Stuck-on, waxy, tan-brown papules--Discussed benign etiology and prognosis.  ? ?face ?Yellowish papule  ? ? ?Assessment & Plan  ?Viral warts, unspecified type ?R dorsal wrist , R dorsum hand,  R thumb webspace ? ?Clear of warts today. ? ?Discussed viral etiology and risk of spread.  Discussed multiple treatments may be required to clear warts.  Discussed possible post-treatment dyspigmentation and risk of recurrence.  ? ?Inflamed seborrheic keratosis (2) ?right scalp supra auricular, right forehead  (2) ? ?Destruction of lesion - right scalp supra auricular, right forehead  (2) ?Complexity: simple   ?Destruction method: cryotherapy   ?Informed consent: discussed and consent obtained   ?Timeout:  patient name, date of birth, surgical site, and procedure verified ?Lesion destroyed using liquid nitrogen: Yes   ?Region frozen until ice ball extended beyond lesion: Yes   ?Outcome: patient tolerated procedure well with no complications   ?Post-procedure details: wound care instructions given   ? ?Sebaceous hyperplasia ?face ? ?Benign-appearing.   Observation.  Call clinic for new or changing lesions.  Recommend daily use of broad spectrum spf 30+ sunscreen to sun-exposed areas.   ? ? ?Return in about 3 months (around 03/10/2022) for warts. ? ?I, Marye Round, CMA, am acting as scribe for Sarina Ser, MD .  ?Documentation: I have reviewed the above documentation for accuracy and completeness, and I agree with the above. ? ?Sarina Ser, MD ? ?

## 2021-12-08 NOTE — Patient Instructions (Addendum)

## 2021-12-13 DIAGNOSIS — B20 Human immunodeficiency virus [HIV] disease: Secondary | ICD-10-CM | POA: Diagnosis not present

## 2021-12-13 DIAGNOSIS — Z113 Encounter for screening for infections with a predominantly sexual mode of transmission: Secondary | ICD-10-CM | POA: Diagnosis not present

## 2021-12-15 DIAGNOSIS — F4322 Adjustment disorder with anxiety: Secondary | ICD-10-CM | POA: Diagnosis not present

## 2021-12-21 ENCOUNTER — Encounter: Payer: Self-pay | Admitting: Dermatology

## 2021-12-22 ENCOUNTER — Other Ambulatory Visit: Payer: Self-pay

## 2021-12-22 DIAGNOSIS — I10 Essential (primary) hypertension: Secondary | ICD-10-CM | POA: Diagnosis not present

## 2021-12-22 DIAGNOSIS — R7309 Other abnormal glucose: Secondary | ICD-10-CM | POA: Diagnosis not present

## 2021-12-22 DIAGNOSIS — Z125 Encounter for screening for malignant neoplasm of prostate: Secondary | ICD-10-CM

## 2021-12-22 DIAGNOSIS — E66811 Obesity, class 1: Secondary | ICD-10-CM

## 2021-12-22 DIAGNOSIS — Z0000 Encounter for general adult medical examination without abnormal findings: Secondary | ICD-10-CM

## 2021-12-22 DIAGNOSIS — F3341 Major depressive disorder, recurrent, in partial remission: Secondary | ICD-10-CM

## 2021-12-22 DIAGNOSIS — E781 Pure hyperglyceridemia: Secondary | ICD-10-CM | POA: Diagnosis not present

## 2021-12-22 DIAGNOSIS — F4322 Adjustment disorder with anxiety: Secondary | ICD-10-CM | POA: Diagnosis not present

## 2021-12-22 DIAGNOSIS — E669 Obesity, unspecified: Secondary | ICD-10-CM

## 2021-12-23 LAB — CBC WITH DIFFERENTIAL/PLATELET
Absolute Monocytes: 370 cells/uL (ref 200–950)
Basophils Absolute: 40 cells/uL (ref 0–200)
Basophils Relative: 0.9 %
Eosinophils Absolute: 119 cells/uL (ref 15–500)
Eosinophils Relative: 2.7 %
HCT: 43.3 % (ref 38.5–50.0)
Hemoglobin: 14.3 g/dL (ref 13.2–17.1)
Lymphs Abs: 1170 cells/uL (ref 850–3900)
MCH: 30.6 pg (ref 27.0–33.0)
MCHC: 33 g/dL (ref 32.0–36.0)
MCV: 92.5 fL (ref 80.0–100.0)
MPV: 10.4 fL (ref 7.5–12.5)
Monocytes Relative: 8.4 %
Neutro Abs: 2702 cells/uL (ref 1500–7800)
Neutrophils Relative %: 61.4 %
Platelets: 166 10*3/uL (ref 140–400)
RBC: 4.68 10*6/uL (ref 4.20–5.80)
RDW: 14.2 % (ref 11.0–15.0)
Total Lymphocyte: 26.6 %
WBC: 4.4 10*3/uL (ref 3.8–10.8)

## 2021-12-23 LAB — COMPLETE METABOLIC PANEL WITH GFR
AG Ratio: 1.8 (calc) (ref 1.0–2.5)
ALT: 18 U/L (ref 9–46)
AST: 20 U/L (ref 10–35)
Albumin: 4.5 g/dL (ref 3.6–5.1)
Alkaline phosphatase (APISO): 69 U/L (ref 35–144)
BUN: 16 mg/dL (ref 7–25)
CO2: 28 mmol/L (ref 20–32)
Calcium: 9.3 mg/dL (ref 8.6–10.3)
Chloride: 102 mmol/L (ref 98–110)
Creat: 1.26 mg/dL (ref 0.70–1.35)
Globulin: 2.5 g/dL (calc) (ref 1.9–3.7)
Glucose, Bld: 98 mg/dL (ref 65–139)
Potassium: 4.7 mmol/L (ref 3.5–5.3)
Sodium: 140 mmol/L (ref 135–146)
Total Bilirubin: 0.5 mg/dL (ref 0.2–1.2)
Total Protein: 7 g/dL (ref 6.1–8.1)
eGFR: 65 mL/min/{1.73_m2} (ref >=60)

## 2021-12-23 LAB — PSA: PSA: 2.35 ng/mL (ref <=4.00)

## 2021-12-23 LAB — HEMOGLOBIN A1C
Hgb A1c MFr Bld: 5.2 % of total Hgb (ref <5.7)
Mean Plasma Glucose: 103 mg/dL
eAG (mmol/L): 5.7 mmol/L

## 2021-12-23 LAB — LIPID PANEL
Cholesterol: 177 mg/dL (ref <200)
HDL: 51 mg/dL (ref >=40)
LDL Cholesterol (Calc): 103 mg/dL (calc) — ABNORMAL HIGH
Non-HDL Cholesterol (Calc): 126 mg/dL (calc) (ref <130)
Total CHOL/HDL Ratio: 3.5 (calc) (ref <5.0)
Triglycerides: 132 mg/dL (ref <150)

## 2021-12-23 LAB — TSH: TSH: 0.73 mIU/L (ref 0.40–4.50)

## 2021-12-29 ENCOUNTER — Ambulatory Visit: Payer: Self-pay | Admitting: Family Medicine

## 2021-12-29 MED ORDER — SERTRALINE 100 MG TABLET
ORAL_TABLET | 5 refills | 0 days
Start: 2021-12-29 — End: ?

## 2021-12-30 DIAGNOSIS — F4322 Adjustment disorder with anxiety: Secondary | ICD-10-CM | POA: Diagnosis not present

## 2021-12-30 NOTE — Unmapped (Signed)
Left message for patient asking him to call back and schedule an appt so we can refill his meds.

## 2021-12-30 NOTE — Unmapped (Signed)
Have not seen pt since 2021. Need OV.

## 2021-12-31 ENCOUNTER — Other Ambulatory Visit: Payer: Self-pay | Admitting: Family Medicine

## 2021-12-31 ENCOUNTER — Ambulatory Visit (INDEPENDENT_AMBULATORY_CARE_PROVIDER_SITE_OTHER): Payer: BC Managed Care – PPO | Admitting: Family Medicine

## 2021-12-31 ENCOUNTER — Encounter: Payer: Self-pay | Admitting: Family Medicine

## 2021-12-31 VITALS — BP 120/64 | HR 92 | Ht 70.0 in | Wt 229.8 lb

## 2021-12-31 DIAGNOSIS — I10 Essential (primary) hypertension: Secondary | ICD-10-CM | POA: Diagnosis not present

## 2021-12-31 DIAGNOSIS — Z0000 Encounter for general adult medical examination without abnormal findings: Secondary | ICD-10-CM | POA: Diagnosis not present

## 2021-12-31 DIAGNOSIS — Z1211 Encounter for screening for malignant neoplasm of colon: Secondary | ICD-10-CM

## 2021-12-31 DIAGNOSIS — F3341 Major depressive disorder, recurrent, in partial remission: Secondary | ICD-10-CM

## 2021-12-31 DIAGNOSIS — E782 Mixed hyperlipidemia: Secondary | ICD-10-CM

## 2021-12-31 DIAGNOSIS — M48061 Spinal stenosis, lumbar region without neurogenic claudication: Secondary | ICD-10-CM

## 2021-12-31 DIAGNOSIS — F1020 Alcohol dependence, uncomplicated: Secondary | ICD-10-CM

## 2021-12-31 DIAGNOSIS — F411 Generalized anxiety disorder: Secondary | ICD-10-CM

## 2021-12-31 DIAGNOSIS — R972 Elevated prostate specific antigen [PSA]: Secondary | ICD-10-CM

## 2021-12-31 DIAGNOSIS — B20 Human immunodeficiency virus [HIV] disease: Secondary | ICD-10-CM

## 2021-12-31 MED ORDER — GABAPENTIN 600 MG PO TABS: 900.0000 mg | ORAL_TABLET | Freq: Three times a day (TID) | ORAL | 3 refills | Status: DC

## 2021-12-31 MED ORDER — SERTRALINE HCL 100 MG PO TABS: 200.0000 mg | ORAL_TABLET | Freq: Every day | ORAL | 3 refills | Status: DC

## 2021-12-31 NOTE — Unmapped (Signed)
Noted  

## 2021-12-31 NOTE — Assessment & Plan Note (Signed)
Followed by ID specialist Dr Delaine Lame Undetectable On [facility]

## 2021-12-31 NOTE — Assessment & Plan Note (Signed)
Currently stable Partial remission Continue current therapy, re order Sertraline '100mg'$  x 2 = '200mg'$  Off Mirtazapine

## 2021-12-31 NOTE — Assessment & Plan Note (Signed)
Remains alcohol free Last drink 09/2020

## 2021-12-31 NOTE — Assessment & Plan Note (Signed)
Stable On med management SSRI

## 2021-12-31 NOTE — Progress Notes (Signed)
Subjective:    Patient ID: Daryl Shelton, male    DOB: [birth year], 61 y.o.   MRN: [id number]  RASUL DECOLA is a 61 y.o. male presenting on 12/31/2021 for Annual Exam   HPI  Here for Annual Physical and Lab Review  Lumbar DDD S/p Spinal surgery Lumbar Laminectomy 02/16/21 Dr Dawley He had complications with numbness from waist down, has improved. - He also has some cervical spine DDD   Alcohol Abuse history Neuropathy + RLS Abstained now from alcohol since 09/22/20, he was drinking daily for 30 years Prior history  R worse than Left. Both burning and also has RLS. 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 Gabapentin 957m TID (600 mg tab + half = 900), needs printed order. taking Ropinirole earlier in mid afternoon, current dose 120mdaily works well. Instead of taking at bedtime.  Obesity BMI >32 Doing well off Mirtazapine. Improved weight loss on Contrave weight improved 242 down to 229 lbs  Has chronic spinal DDD asking about chiropractor, future x-rays   ED On Cialis PRN   Major Depression recurrent Generalized Anxiety Hx Slight Agoraphobia Working with therapist BrHardie Shackletonistory of childhood trauma, contributed  On Sertraline 10059m2  = 200m45mily   HIV, chronic, controlled and undetectable Followed by Dr RaviDelaine Lamene ID) On Biktarvy   CHRONIC HTN: Reports no concerns Current Meds - Lisinopril 40mg20mly, HCTZ 25mg 52my   Reports good compliance, took meds today. Tolerating well, w/o complaints. Denies CP, dyspnea, HA, edema, dizziness / lightheadedness   Venous Stasis Dermatits / Insufficiency Improved with elevation   HYPERLIPIDEMIA: - Reports no concerns. Last lipid panel 12/2021, normal, without statin > 9 months was on atorvastatin 20mg N16mn therapy Lifestyle - Diet: improved plant based diet   Health Maintenance:  Prostate CA Screening: prior PSA 0.9 (2018 at [facility]), [facility] 2.35 (12/2021). No  prior other issues with prostate. Does not endorse BPH or urinary symptoms LUTS. NO family history of prostate CA      12/31/2021    2:49 PM 05/28/2021    3:37 PM  Depression screen PHQ 2/9  Decreased Interest 0 0  Down, Depressed, Hopeless 1 1  PHQ - 2 Score 1 1  Altered sleeping 0 0  Tired, decreased energy 0 1  Change in appetite 1 1  Feeling bad or failure about yourself  1 0  Trouble concentrating 0 0  Moving slowly or fidgety/restless 0 0  Suicidal thoughts 0 0  PHQ-9 Score 3 3  Difficult doing work/chores Not difficult at all Not difficult at all    Past Medical History:  Diagnosis Date   Alcoholism (HCC)   [place]    Arthritis    Bell's palsy    Depression    HIV (human immunodeficiency virus infection) (HCC)   [place]    Past Surgical History:  Procedure Laterality Date   LUMBAR LAMINECTOMY/DECOMPRESSION MICRODISCECTOMY N/A 02/16/2021   Procedure: OPEN LUMBAR LAMINECTOMY, LUMBAR TWO-THREE, LUMBAR THREE-FOUR, LUMBAR FOUR-FIVE,LUMBAR FIVE-SACRAL ONE;  Surgeon: Dawley,Karsten RoLocation: [facility] OR;  Service: Neurosurgery;  Laterality: N/A;   Social History   Socioeconomic History   Marital status: Married    Spouse name: Not on file   Number of children: Not on file   Years of education: Not on file   Highest education level: Not on file  Occupational History   Not on file  Tobacco Use   Smoking  status: Every Day    Packs/day: 0.50    Years: 40.00    Pack years: 20.00    Types: Cigarettes   Smokeless tobacco: Never  Vaping Use   Vaping Use: Never used  Substance and Sexual Activity   Alcohol use: Not Currently   Drug use: No   Sexual activity: Yes  Other Topics Concern   Not on file  Social History Narrative   Not on file   Social Determinants of Health   Financial Resource Strain: Not on file  Food Insecurity: Not on file  Transportation Needs: Not on file  Physical Activity: Not on file  Stress: Not on file  Social Connections: Not  on file  Intimate Partner Violence: Not on file   Family History  Problem Relation Age of Onset   CAD Father    Stomach cancer Sister    Prostate cancer Neg Hx    Current Outpatient Medications on File Prior to Visit  Medication Sig   bictegravir-emtricitabine-tenofovir AF (BIKTARVY) 50-200-25 MG TABS tablet Take 1 tablet by mouth daily.   chlorhexidine (PERIDEX) 0.12 % solution Use as directed 15 mLs in the mouth or throat daily.   CONTRAVE 8-90 MG TB12 Take 2 tablets by mouth 2 (two) times daily with a meal.   hydrochlorothiazide (HYDRODIURIL) 25 MG tablet Take 1 tablet (25 mg total) by mouth daily.   lisinopril (ZESTRIL) 40 MG tablet TAKE 1 TABLET(40 MG) BY MOUTH DAILY   Multiple Vitamins-Minerals (MULTIVITAMIN ADULTS 50+) TABS Take 1 tablet by mouth daily.   rOPINIRole (REQUIP) 1 MG tablet Take 1 tablet (1 mg total) by mouth at bedtime.   tadalafil (CIALIS) 10 MG tablet Take 1 tablet (10 mg total) by mouth every other day as needed for erectile dysfunction.   No current facility-administered medications on file prior to visit.    Review of Systems  Constitutional:  Negative for activity change, appetite change, chills, diaphoresis, fatigue and fever.  HENT:  Negative for congestion and hearing loss.   Eyes:  Negative for visual disturbance.  Respiratory:  Negative for cough, chest tightness, shortness of breath and wheezing.   Cardiovascular:  Negative for chest pain, palpitations and leg swelling.  Gastrointestinal:  Negative for abdominal pain, constipation, diarrhea, nausea and vomiting.  Genitourinary:  Negative for dysuria, frequency and hematuria.  Musculoskeletal:  Positive for back pain. Negative for arthralgias and neck pain.  Skin:  Negative for rash.  Neurological:  Negative for dizziness, weakness, light-headedness, numbness and headaches.  Hematological:  Negative for adenopathy.  Psychiatric/Behavioral:  Negative for behavioral problems, dysphoric mood and sleep  disturbance.   Per HPI unless specifically indicated above      Objective:    BP 120/64   Pulse 92   Ht 5' 10" (1.778 m)   Wt 229 lb 12.8 oz (104.2 kg)   SpO2 99%   BMI 32.97 kg/m   Wt Readings from Last 3 Encounters:  12/31/21 229 lb 12.8 oz (104.2 kg)  09/24/21 242 lb 3.2 oz (109.9 kg)  07/15/21 238 lb (108 kg)    Physical Exam Vitals and nursing note reviewed.  Constitutional:      General: He is not in acute distress.    Appearance: He is well-developed. He is not diaphoretic.     Comments: Well-appearing, comfortable, cooperative  HENT:     Head: Normocephalic and atraumatic.  Eyes:     General:        Right eye: No discharge.  Left eye: No discharge.     Conjunctiva/sclera: Conjunctivae normal.     Pupils: Pupils are equal, round, and reactive to light.  Neck:     Thyroid: No thyromegaly.     Vascular: No carotid bruit.  Cardiovascular:     Rate and Rhythm: Normal rate and regular rhythm.     Pulses: Normal pulses.     Heart sounds: Normal heart sounds. No murmur heard. Pulmonary:     Effort: Pulmonary effort is normal. No respiratory distress.     Breath sounds: Normal breath sounds. No wheezing or rales.  Abdominal:     General: Bowel sounds are normal. There is no distension.     Palpations: Abdomen is soft. There is no mass.     Tenderness: There is no abdominal tenderness.  Musculoskeletal:        General: No tenderness. Normal range of motion.     Cervical back: Normal range of motion and neck supple.     Right lower leg: No edema.     Left lower leg: No edema.     Comments: Upper / Lower Extremities: - Normal muscle tone, strength bilateral upper extremities 5/5, lower extremities 5/5  Lymphadenopathy:     Cervical: No cervical adenopathy.  Skin:    General: Skin is warm and dry.     Findings: No erythema or rash.  Neurological:     Mental Status: He is alert and oriented to person, place, and time.     Comments: Distal sensation intact  to light touch all extremities  Psychiatric:        Mood and Affect: Mood normal.        Behavior: Behavior normal.        Thought Content: Thought content normal.     Comments: Well groomed, good eye contact, normal speech and thoughts     Results for orders placed or performed in visit on 12/22/21  TSH  Result Value Ref Range   TSH 0.73 0.40 - 4.50 mIU/L  PSA  Result Value Ref Range   PSA 2.35 < OR = 4.00 ng/mL  Hemoglobin A1c  Result Value Ref Range   Hgb A1c MFr Bld 5.2 <5.7 % of total Hgb   Mean Plasma Glucose 103 mg/dL   eAG (mmol/L) 5.7 mmol/L  Lipid panel  Result Value Ref Range   Cholesterol 177 <200 mg/dL   HDL 51 > OR = 40 mg/dL   Triglycerides 132 <150 mg/dL   LDL Cholesterol (Calc) 103 (H) mg/dL (calc)   Total CHOL/HDL Ratio 3.5 <5.0 (calc)   Non-HDL Cholesterol (Calc) 126 <130 mg/dL (calc)  CBC with Differential/Platelet  Result Value Ref Range   WBC 4.4 3.8 - 10.8 Thousand/uL   RBC 4.68 4.20 - 5.80 Million/uL   Hemoglobin 14.3 13.2 - 17.1 g/dL   HCT 43.3 38.5 - 50.0 %   MCV 92.5 80.0 - 100.0 fL   MCH 30.6 27.0 - 33.0 pg   MCHC 33.0 32.0 - 36.0 g/dL   RDW 14.2 11.0 - 15.0 %   Platelets 166 140 - 400 Thousand/uL   MPV 10.4 7.5 - 12.5 fL   Neutro Abs 2,702 1,500 - 7,800 cells/uL   Lymphs Abs 1,170 850 - 3,900 cells/uL   Absolute Monocytes 370 200 - 950 cells/uL   Eosinophils Absolute 119 15 - 500 cells/uL   Basophils Absolute 40 0 - 200 cells/uL   Neutrophils Relative % 61.4 %   Total Lymphocyte 26.6 %   Monocytes  Relative 8.4 %   Eosinophils Relative 2.7 %   Basophils Relative 0.9 %  COMPLETE METABOLIC PANEL WITH GFR  Result Value Ref Range   Glucose, Bld 98 65 - 139 mg/dL   BUN 16 7 - 25 mg/dL   Creat 1.26 0.70 - 1.35 mg/dL   eGFR 65 > OR = 60 mL/min/1.48m   BUN/Creatinine Ratio NOT APPLICABLE 6 - 22 (calc)   Sodium 140 135 - 146 mmol/L   Potassium 4.7 3.5 - 5.3 mmol/L   Chloride 102 98 - 110 mmol/L   CO2 28 20 - 32 mmol/L   Calcium 9.3 8.6  - 10.3 mg/dL   Total Protein 7.0 6.1 - 8.1 g/dL   Albumin 4.5 3.6 - 5.1 g/dL   Globulin 2.5 1.9 - 3.7 g/dL (calc)   AG Ratio 1.8 1.0 - 2.5 (calc)   Total Bilirubin 0.5 0.2 - 1.2 mg/dL   Alkaline phosphatase (APISO) 69 35 - 144 U/L   AST 20 10 - 35 U/L   ALT 18 9 - 46 U/L      Assessment & Plan:   Problem List Items Addressed This Visit     Major depressive disorder, recurrent, in partial remission (HCC)    Currently stable Partial remission Continue current therapy, re order Sertraline 1045mx 2 = 20058mff Mirtazapine       Relevant Medications   sertraline (ZOLOFT) 100 MG tablet   Hyperlipidemia, mixed    Controlled cholesterol off statin and lifestyle Last lipid panel 12/2021 LDL nearly 100 The 10-year ASCVD risk score (Arnett DK, et al., 2019) is: 13.3%  Plan: 1. Off Atorvastatin 28m75miscussed future reconsider low dose for ASCVD risk only 2. Encourage improved lifestyle - low carb/cholesterol, reduce portion size, continue improving regular exercise       GAD (generalized anxiety disorder)    Stable On med management SSRI       Relevant Medications   sertraline (ZOLOFT) 100 MG tablet   Essential hypertension    Well-controlled HTN - Home BP readings    No known complications    Plan:  1.  Continue current BP regimen Lisinopril 40mg33mly and HCTZ 25mg 35my 2. Encourage improved lifestyle - low sodium diet, regular exercise 3. Continue monitor BP outside office, bring readings to next visit, if persistently >140/90 or new symptoms notify office sooner        Currently asymptomatic HIV infection, with history of HIV-related illness (HCC)  [place] specialist Dr RavishDelaine Lameectable On Biktarvy       Alcohol dependence, uncomplicated (HCC)  [place] alcohol free Last drink 09/2020       Other Visit Diagnoses     Annual physical exam    -  Primary   Screening for colon cancer       Relevant Orders   Cologuard   HIV disease  (HCC)  [place]     Spinal stenosis of lumbar region, unspecified whether neurogenic claudication present       Relevant Medications   gabapentin (NEURONTIN) 600 MG tablet       Updated Health Maintenance information Reviewed recent lab results with patient Encouraged improvement to lifestyle with diet and exercise Goal of weight loss  Due for routine colon cancer screening. Declines Colonoscopy - Discussion today about recommendations for either Colonoscopy or Cologuard screening, benefits and risks of screening, interested in Cologuard, understands that if positive then recommendation is for diagnostic colonoscopy to follow-up. - Ordered  Cologuard today  Spinal stenosis Re order Gabapentin Future Chiropractor if interested, notify us if need referral / future update images x-rays  PSA mild elevated 2.35 range still normal but trajectory is questionable from 5 years 0.9 upwards Will repeat lab in 6 months to follow trend. If same or less, can check in 6 more months, if >3 would pursue urology consultation  Orders Placed This Encounter  Procedures   Cologuard      Meds ordered this encounter  Medications   gabapentin (NEURONTIN) 600 MG tablet    Sig: Take 1.5 tablets (900 mg total) by mouth 3 (three) times daily.    Dispense:  405 tablet    Refill:  3   sertraline (ZOLOFT) 100 MG tablet    Sig: Take 2 tablets (200 mg total) by mouth daily.    Dispense:  180 tablet    Refill:  3     Follow up plan: Return in about 6 months (around 07/03/2022) for 6 month non fasting lab then 1 week later Follow-up lab PSA result, Weight.  Future 6 month PSA  Nobie Putnam, DO [facility] 12/31/2021, 2:43 PM

## 2021-12-31 NOTE — Patient Instructions (Addendum)
Thank you for coming to the office today.  Non fasting lab for PSA in 6 months.  PSA trend did increase over 5 years, we need to follow it closely.  Recent Labs    09/24/21 1456 12/22/21 0832  HGBA1C 5.3 5.2   Lipid Panel     Component Value Date/Time   CHOL 177 12/22/2021 0832   TRIG 132 12/22/2021 0832   HDL 51 12/22/2021 0832   CHOLHDL 3.5 12/22/2021 0832   VLDL UNABLE TO CALCULATE IF TRIGLYCERIDE OVER 400 mg/dL 05/04/2016 1351   LDLCALC 103 (H) 12/22/2021 0832   Excellent cholesterol, keep up the great work!   Ordered the Cologuard (home kit) test for colon cancer screening. Stay tuned for further updates.  It will be shipped to you directly. If not received in 2-4 weeks, call us or the company.   If you send it back and no results are received in 2-4 weeks, call us or the company as well!   Colon Cancer Screening: - For all adults age 38+ routine colon cancer screening is highly recommended.     - Recent guidelines from [employer] recommend starting age of 70 - Early detection of colon cancer is important, because often there are no warning signs or symptoms, also if found early usually it can be cured. Late stage is hard to treat.   - If Cologuard is NEGATIVE, then it is good for 3 years before next due - If Cologuard is POSITIVE, then it is strongly advised to get a Colonoscopy, which allows the GI doctor to locate the source of the cancer or polyp (even very early stage) and treat it by removing it. -------------------------  - FIRST, call your insurance company and tell them you want to check cost of Cologuard tell them CPT Code [phone] (it may be completely covered and you could get for no cost, OR max cost without any coverage is about $600). Also, keep in mind if you do NOT open the kit, and decide not to do the test, you will NOT be charged, you should contact the company if you decide not to do the test.  Follow instructions to collect sample, you  may call the company for any help or questions, 24/7 telephone support at [phone].   Please schedule a Follow-up Appointment to: Return in about 6 months (around 07/03/2022) for 6 month non fasting lab then 1 week later Follow-up lab PSA result, Weight.  If you have any other questions or concerns, please feel free to call the office or send a message through [place]. You may also schedule an earlier appointment if necessary.  Additionally, you may be receiving a survey about your experience at our office within a few days to 1 week by e-mail or mail. We value your feedback.  Nobie Putnam, DO [place]

## 2021-12-31 NOTE — Assessment & Plan Note (Signed)
Controlled cholesterol off statin and lifestyle Last lipid panel 12/2021 LDL nearly 100 The 10-year ASCVD risk score (Arnett DK, et al., 2019) is: 13.3%  Plan: 1. Off Atorvastatin '20mg'$ , discussed future reconsider low dose for ASCVD risk only 2. Encourage improved lifestyle - low carb/cholesterol, reduce portion size, continue improving regular exercise

## 2021-12-31 NOTE — Assessment & Plan Note (Signed)
Well-controlled HTN - Home BP readings    No known complications    Plan:  1.  Continue current BP regimen Lisinopril 40mg daily and HCTZ 25mg daily 2. Encourage improved lifestyle - low sodium diet, regular exercise 3. Continue monitor BP outside office, bring readings to next visit, if persistently >140/90 or new symptoms notify office sooner  

## 2022-01-05 DIAGNOSIS — F4322 Adjustment disorder with anxiety: Secondary | ICD-10-CM | POA: Diagnosis not present

## 2022-01-11 DIAGNOSIS — F4322 Adjustment disorder with anxiety: Secondary | ICD-10-CM | POA: Diagnosis not present

## 2022-01-13 ENCOUNTER — Ambulatory Visit: Payer: BC Managed Care – PPO | Attending: Infectious Diseases | Admitting: Infectious Diseases

## 2022-01-13 ENCOUNTER — Encounter: Payer: Self-pay | Admitting: Infectious Diseases

## 2022-01-13 ENCOUNTER — Other Ambulatory Visit
Admission: RE | Admit: 2022-01-13 | Discharge: 2022-01-13 | Disposition: A | Discharge status: Home / Self Care | Payer: BC Managed Care – PPO | Attending: Infectious Diseases | Admitting: Infectious Diseases

## 2022-01-13 VITALS — BP 106/72 | HR 78 | Temp 97.3°F | Ht 68.0 in | Wt 229.0 lb

## 2022-01-13 DIAGNOSIS — Z79899 Other long term (current) drug therapy: Secondary | ICD-10-CM | POA: Diagnosis not present

## 2022-01-13 DIAGNOSIS — F32A Depression, unspecified: Secondary | ICD-10-CM | POA: Insufficient documentation

## 2022-01-13 DIAGNOSIS — M48061 Spinal stenosis, lumbar region without neurogenic claudication: Secondary | ICD-10-CM | POA: Insufficient documentation

## 2022-01-13 DIAGNOSIS — Z21 Asymptomatic human immunodeficiency virus [HIV] infection status: Secondary | ICD-10-CM | POA: Insufficient documentation

## 2022-01-13 DIAGNOSIS — E785 Hyperlipidemia, unspecified: Secondary | ICD-10-CM | POA: Diagnosis not present

## 2022-01-13 DIAGNOSIS — F419 Anxiety disorder, unspecified: Secondary | ICD-10-CM | POA: Diagnosis not present

## 2022-01-13 DIAGNOSIS — B20 Human immunodeficiency virus [HIV] disease: Secondary | ICD-10-CM | POA: Insufficient documentation

## 2022-01-13 DIAGNOSIS — F17210 Nicotine dependence, cigarettes, uncomplicated: Secondary | ICD-10-CM | POA: Diagnosis not present

## 2022-01-13 DIAGNOSIS — I10 Essential (primary) hypertension: Secondary | ICD-10-CM | POA: Diagnosis not present

## 2022-01-13 DIAGNOSIS — G629 Polyneuropathy, unspecified: Secondary | ICD-10-CM | POA: Insufficient documentation

## 2022-01-13 NOTE — Progress Notes (Signed)
NAME: Daryl Shelton  DOB: 1961/01/12  MRN: [id number]  Date/Time: 01/13/2022 11:40 AM  Pt is here for follow up of HIV Doing well- last seen Dec 2022 Last Vl < 20 on 01/28/21 and cd4 925 On 100% biktarvy  His sister passed on 2 weeks ago from gastric carcinoma Started contrave ( naltrexone + bupropion)  for weight loss prescribed by PCP Lost 9 pounds  The following copied from last note Daryl Shelton is a 61 y.o. 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 Pt was  at [facility], until 2021 On Biktarvy - 100% adherent last cd4 on 02/04/20 was 588(49%) and Vl < 20  Nadir Cd4 unknown-as far as I can see [facility] records- 08/19/10- 629 OI -none  HAARt history Genvoya Dont know the medicine he took before Acquired thru sex with men  Educational psychologist specialty ? Past Medical History:  Diagnosis Date   Alcoholism ([place])    Anxiety    Arthritis    Bell's palsy    Depression    HIV (human immunodeficiency virus infection) ([place])    Hypertension    Spinal stenosis-   PSH 02/16/2021 Bilateral L2, L3, L4, L5, S1 laminectomies for decompression of neural elements with partial bilateral L2-3, L3-4, L4-5, L5-S1 medial facetectomies  SH Lives with his husband who is HIV positive and undetectable and is my patient Smoker 1PPD Another sister lives with him No alcohol since Sep 22, 2020 Was drinking before for 30 years Past h/o substance use- clean many years Born [place] in [place] Now in [place] for the past 96yr  Was working in [employer] until Dec 2021.    Family History  Problem Relation Age of Onset   CAD Father    Stomach cancer Sister    Prostate cancer Neg Hx   Twin brother -HIV 5 sisters- 3 of 5 opioid dependence due to back pain 1 sister died of gastric carcinoma  Allergies  Allergen Reactions   Varenicline Other (See Comments)    CNS symptoms  - anxiety, agitation     Pollen Extract Other (See Comments)    Congestion    ? Current Outpatient Medications  Medication Sig Dispense Refill   atorvastatin (LIPITOR) 20 MG tablet Take by mouth.     BIKTARVY 50-200-25 MG TABS tablet TAKE 1 TABLET BY MOUTH DAILY 90 tablet 1   chlorhexidine (PERIDEX) 0.12 % solution 15 mLs 2 (two) times daily.     gabapentin (NEURONTIN) 600 MG tablet Take 1 tablet by mouth 3 (three) times daily.     hydrochlorothiazide (HYDRODIURIL) 25 MG tablet TAKE 1 TABLET(25 MG) BY MOUTH DAILY 90 tablet 3   ketoconazole (NIZORAL) 2 % cream Apply 1 application topically daily. 60 g 2   lisinopril (ZESTRIL) 40 MG tablet Take by mouth.     mirtazapine (REMERON) 30 MG tablet Take by mouth.     nicotine (NICODERM CQ - DOSED IN MG/24 HOURS) 21 mg/24hr patch Place onto the skin.           sertraline (ZOLOFT) 100 MG tablet Take 200 mg by mouth daily.     tadalafil (CIALIS) 10 MG tablet Take 1 tablet (10 mg total) by mouth every other day as needed for erectile dysfunction. 10 tablet 1   terbinafine (LAMISIL) 250 MG tablet Take 1 tablet (250 mg total) by mouth daily. 90 tablet 0   Naprosyn MVT  REVIEW OF SYSTEMS:  Const: negative fever, negative chills, some weight loss Eyes: negative diplopia or visual changes, negative eye pain ENT: negative coryza, negative sore throat Resp: negative cough, hemoptysis, dyspnea Cards: negative for chest pain, palpitations, lower extremity edema GU: negative for frequency, dysuria and hematuria Skin: negative for rash and pruritus Heme: negative for easy bruising and gum/nose bleeding MS: negative for myalgias, arthralgias, back pain and muscle weakness Neurolo:numbness fingers Psych:  anxiety  Objective:  VITALS:  BP 106/72   Pulse 78   Temp (!) 97.3 F (36.3 C) (Temporal)   Ht '5\' 8"'$  (1.727 m)   Wt 229 lb (103.9 kg)   BMI 34.82 kg/m  PHYSICAL EXAM:  General: looks well.  Head: Normocephalic, without obvious  abnormality, atraumatic. Eyes: Conjunctivae clear, anicteric sclerae. Pupils are equal Nose: Nares normal. No drainage or sinus tenderness. Throat: Lips, mucosa, and tongue normal. No Thrush Neck: symmetrical, no adenopathy, thyroid: non tender no carotid bruit and no JVD. Lungs: Clear to auscultation bilaterally. No Wheezing or Rhonchi. No rales. Heart: Regular rate and rhythm, no murmur, rub or gallop. Abdomen: Soft, non-tender,not distended. Bowel sounds normal. No masses Extremities: Extremities normal, atraumatic, no cyanosis. No edema. No clubbing Skin: No rashes or lesions. Not Jaundiced Lymph: Cervical, supraclavicular normal. Neurologic:numbness hands Health maintenance Vaccination Immunizations   Immunizations Name Administration Dates Next Due  KPTWS-56 VACCINE,MRNA(MODERNA)(PF)(IM) 11/27/2019, 10/28/2019    Hepatitis A 12/29/2010, 04/21/2010    Hepatitis B, Adult 05/10/2017, 04/21/2010, 11/04/2009, 07/08/2009    INFLUENZA INJ MDCK PF, QUAD,(FLUCELVAX)(25MO AND UP EGG FREE) 05/11/2020    INFLUENZA TIV (TRI) PF (IM) 06/23/2011, 04/21/2010, 07/08/2009    Influenza Vaccine Quad (IIV4 PF) 94mo injectable 05/02/2019, 05/07/2018, 05/10/2017, 05/05/2016, 10/01/2015, 08/28/2014, 05/02/2013, 06/28/2012    PNEUMOCOCCAL POLYSACCHARIDE 23 02/26/2015, 02/19/2009    PPD Test 12/29/2010, 11/04/2009    Pneumococcal Conjugate 13-Valent 03/27/2014    SHINGRIX-ZOSTER VACCINE (HZV), RECOMBINANT,SUB-UNIT,ADJUVANTED IM 02/04/2020, 08/06/2019    TdaP 02/19/2009    Tetanus-Diptheria Toxoids-TD(TDVAX),Asdorbed,2LF(IM) 08/06/2019      ______________________  Labs Lab Result  Date comment  HIV VL <20 01/28/21   CD4 925(54%) 01/28/21   Genotype     [id number]     HIV antibody     RPR NR 01/28/21   Quantiferon Gold NR 01/28/21   Hep C ab NR 01/28/21   Hepatitis B-ab,ag,c Sab 124 01/28/21   Hepatitis A-IgM, IgG /T     Lipid     GC/CHL NR-rectum throat 03/11/21   PAP     HB,PLT,Cr, LFT  13.9/181/1.26 02/10/21     Preventive  Procedure Result  Date comment  colonoscopy  06/20/2017   Mammogram     Dental exam     [facility] Y [facility] [place]domen- no aneurysm of aorta 05/21/18 CT lung cancer screen  Impression/Recommendation HIV- - on Biktarvy- well controlled- Vl < 20 on 01/28/21 And cd4 is 927  HTN on lisinopril and HCTZ  Hyperlipidemia on atorvastatin Needs Lipid labs next blood draw  Anxiety/depression- zoloft, remeron  Spinal steosis- underwent lumbar laminectomies  L2-L5 on 02/16/21- feeling much better  Peripheral neuropathy Current smoker  Seborrheic warts scalp/trunk- saw Derm and had cryotherapy and resolved Labs today?  Pt in a monogamous relationship - partner positive and undetectable _________________________________________________ Discussed with patient in detail.  Follow up 3 months-

## 2022-01-14 DIAGNOSIS — Z1211 Encounter for screening for malignant neoplasm of colon: Secondary | ICD-10-CM | POA: Diagnosis not present

## 2022-01-14 LAB — T-HELPER CELLS CD4/CD8 %
% CD 4 Pos. Lymph.: 49.2 % (ref 30.8–58.5)
Absolute CD 4 Helper: 640 /uL (ref 359–1519)
Basophils Absolute: 0 10*3/uL (ref 0.0–0.2)
Basos: 1 %
CD3+CD4+ Cells/CD3+CD8+ Cells Bld: 2.52 (ref 0.92–3.72)
CD3+CD8+ Cells # Bld: 254 /uL (ref 109–897)
CD3+CD8+ Cells NFr Bld: 19.5 % (ref 12.0–35.5)
EOS (ABSOLUTE): 0.1 10*3/uL (ref 0.0–0.4)
Eos: 2 %
Hematocrit: 44.3 % (ref 37.5–51.0)
Hemoglobin: 14.7 g/dL (ref 13.0–17.7)
Immature Grans (Abs): 0 10*3/uL (ref 0.0–0.1)
Immature Granulocytes: 1 %
Lymphocytes Absolute: 1.3 10*3/uL (ref 0.7–3.1)
Lymphs: 26 %
MCH: 29.9 pg (ref 26.6–33.0)
MCHC: 33.2 g/dL (ref 31.5–35.7)
MCV: 90 fL (ref 79–97)
Monocytes Absolute: 0.3 10*3/uL (ref 0.1–0.9)
Monocytes: 7 %
Neutrophils Absolute: 3 10*3/uL (ref 1.4–7.0)
Neutrophils: 63 %
Platelets: 192 10*3/uL (ref 150–450)
RBC: 4.91 x10E6/uL (ref 4.14–5.80)
RDW: 14.2 % (ref 11.6–15.4)
WBC: 4.8 10*3/uL (ref 3.4–10.8)

## 2022-01-14 LAB — RPR: RPR Ser Ql: NONREACTIVE

## 2022-01-14 LAB — HIV-1 RNA QUANT-NO REFLEX-BLD
HIV 1 RNA Quant: <20 copies/mL
LOG10 HIV-1 RNA: UNDETERMINED log10copy/mL

## 2022-01-17 LAB — QUANTIFERON-TB GOLD PLUS (RQFGPL)
QuantiFERON Mitogen Value: >10 IU/mL
QuantiFERON Nil Value: 0.13 IU/mL
QuantiFERON TB1 Ag Value: 0.2 IU/mL
QuantiFERON TB2 Ag Value: 0.18 IU/mL

## 2022-01-17 LAB — QUANTIFERON-TB GOLD PLUS: QuantiFERON-TB Gold Plus: NEGATIVE

## 2022-01-19 ENCOUNTER — Telehealth: Payer: Self-pay

## 2022-01-19 NOTE — Telephone Encounter (Signed)
-----   Message from Tsosie Billing, MD sent at 01/19/2022  7:35 AM EDT ----- Regarding: FW: Can you please let him know all his labs look good . VL undetectable cd4 high at 660. Thx ----- Message ----- From: Buel Ream, Lab In [place] Sent: 01/14/2022   9:28 AM EDT To: Tsosie Billing, MD

## 2022-01-21 DIAGNOSIS — F4322 Adjustment disorder with anxiety: Secondary | ICD-10-CM | POA: Diagnosis not present

## 2022-01-26 LAB — COLOGUARD: COLOGUARD: NEGATIVE

## 2022-01-28 DIAGNOSIS — F4322 Adjustment disorder with anxiety: Secondary | ICD-10-CM | POA: Diagnosis not present

## 2022-02-03 DIAGNOSIS — F4322 Adjustment disorder with anxiety: Secondary | ICD-10-CM | POA: Diagnosis not present

## 2022-02-04 ENCOUNTER — Other Ambulatory Visit: Payer: Self-pay | Admitting: Family Medicine

## 2022-02-04 DIAGNOSIS — E669 Obesity, unspecified: Secondary | ICD-10-CM

## 2022-02-04 MED ORDER — CONTRAVE 8-90 MG PO TB12: 2.0000 | ORAL_TABLET | Freq: Two times a day (BID) | ORAL | 2 refills | Status: DC

## 2022-02-10 ENCOUNTER — Other Ambulatory Visit: Payer: Self-pay

## 2022-02-10 DIAGNOSIS — F4322 Adjustment disorder with anxiety: Secondary | ICD-10-CM | POA: Diagnosis not present

## 2022-02-10 MED ORDER — BIKTARVY 50-200-25 MG PO TABS: 1.0000 | ORAL_TABLET | Freq: Every day | ORAL | 4 refills | Status: DC

## 2022-02-14 DIAGNOSIS — F4322 Adjustment disorder with anxiety: Secondary | ICD-10-CM | POA: Diagnosis not present

## 2022-02-23 DIAGNOSIS — F4322 Adjustment disorder with anxiety: Secondary | ICD-10-CM | POA: Diagnosis not present

## 2022-03-01 DIAGNOSIS — F4322 Adjustment disorder with anxiety: Secondary | ICD-10-CM | POA: Diagnosis not present

## 2022-03-09 ENCOUNTER — Other Ambulatory Visit: Payer: Self-pay

## 2022-03-09 DIAGNOSIS — E66811 Obesity, class 1: Secondary | ICD-10-CM

## 2022-03-09 DIAGNOSIS — E669 Obesity, unspecified: Secondary | ICD-10-CM

## 2022-03-09 MED ORDER — CONTRAVE 8-90 MG PO TB12: 2.0000 | ORAL_TABLET | Freq: Two times a day (BID) | ORAL | 2 refills | Status: DC

## 2022-03-21 ENCOUNTER — Ambulatory Visit: Payer: 59 | Admitting: Dermatology

## 2022-03-21 DIAGNOSIS — L820 Inflamed seborrheic keratosis: Secondary | ICD-10-CM | POA: Diagnosis not present

## 2022-03-21 DIAGNOSIS — L821 Other seborrheic keratosis: Secondary | ICD-10-CM | POA: Diagnosis not present

## 2022-03-21 DIAGNOSIS — L578 Other skin changes due to chronic exposure to nonionizing radiation: Secondary | ICD-10-CM | POA: Diagnosis not present

## 2022-03-21 NOTE — Progress Notes (Signed)
   Follow-Up Visit   Subjective  Daryl Shelton is a 61 y.o. male who presents for the following: Other (Spots of back and right clavicle). The patient has spots, moles and lesions to be evaluated, some may be new or changing and the patient has concerns that these could be cancer.  The following portions of the chart were reviewed this encounter and updated as appropriate:   Tobacco  Allergies  Meds  Problems  Med Hx  Surg Hx  Fam Hx     Review of Systems:  No other skin or systemic complaints except as noted in HPI or Assessment and Plan.  Objective  Well appearing patient in no apparent distress; mood and affect are within normal limits.  A focused examination was performed including back, neck. Relevant physical exam findings are noted in the Assessment and Plan.  Right back x 2, right clavicle x 1, right hand x 1, right scalp supra auricular x 2 (6) Erythematous stuck-on, waxy papule or plaque   Assessment & Plan   Actinic Damage - chronic, secondary to cumulative UV radiation exposure/sun exposure over time - diffuse scaly erythematous macules with underlying dyspigmentation - Recommend daily broad spectrum sunscreen SPF 30+ to sun-exposed areas, reapply every 2 hours as needed.  - Recommend staying in the shade or wearing long sleeves, sun glasses (UVA+UVB protection) and wide brim hats (4-inch brim around the entire circumference of the hat). - Call for new or changing lesions.  Seborrheic Keratoses - Stuck-on, waxy, tan-brown papules and/or plaques  - Benign-appearing - Discussed benign etiology and prognosis. - Observe - Call for any changes  Inflamed seborrheic keratosis (6) Right back x 2, right clavicle x 1, right hand x 1, right scalp supra auricular x 2  Destruction of lesion - Right back x 2, right clavicle x 1, right hand x 1, right scalp supra auricular x 2 Complexity: simple   Destruction method: cryotherapy   Informed consent: discussed and  consent obtained   Timeout:  patient name, date of birth, surgical site, and procedure verified Lesion destroyed using liquid nitrogen: Yes   Region frozen until ice ball extended beyond lesion: Yes   Outcome: patient tolerated procedure well with no complications   Post-procedure details: wound care instructions given     Return in about 1 year (around 03/22/2023).  I, Ashok Cordia, CMA, am acting as scribe for Sarina Ser, MD . Documentation: I have reviewed the above documentation for accuracy and completeness, and I agree with the above.  Sarina Ser, MD

## 2022-03-21 NOTE — Patient Instructions (Signed)
Cryotherapy Aftercare  Wash gently with soap and water everyday.   Apply Vaseline and Band-Aid daily until healed.     Due to recent changes in healthcare laws, you may see results of your pathology and/or laboratory studies on MyChart before the doctors have had a chance to review them. We understand that in some cases there may be results that are confusing or concerning to you. Please understand that not all results are received at the same time and often the doctors may need to interpret multiple results in order to provide you with the best plan of care or course of treatment. Therefore, we ask that you please give us 2 business days to thoroughly review all your results before contacting the office for clarification. Should we see a critical lab result, you will be contacted sooner.   If You Need Anything After Your Visit  If you have any questions or concerns for your doctor, please call our main line at [phone] and press option 4 to reach your doctor's medical assistant. If no one answers, please leave a voicemail as directed and we will return your call as soon as possible. Messages left after 4 pm will be answered the following business day.   You may also send us a message via MyChart. We typically respond to MyChart messages within 1-2 business days.  For prescription refills, please ask your pharmacy to contact our office. Our fax number is [phone].  If you have an urgent issue when the clinic is closed that cannot wait until the next business day, you can page your doctor at the number below.    Please note that while we do our best to be available for urgent issues outside of office hours, we are not available 24/7.   If you have an urgent issue and are unable to reach us, you may choose to seek medical care at your doctor's office, retail clinic, urgent care center, or emergency room.  If you have a medical emergency, please immediately call 911 or go to the  emergency department.  Pager Numbers  - Dr. Kowalski: [phone]  - Dr. Moye: [phone]  - Dr. Stewart: [phone]  In the event of inclement weather, please call our main line at [phone] for an update on the status of any delays or closures.  Dermatology Medication Tips: Please keep the boxes that topical medications come in in order to help keep track of the instructions about where and how to use these. Pharmacies typically print the medication instructions only on the boxes and not directly on the medication tubes.   If your medication is too expensive, please contact our office at [phone] option 4 or send us a message through MyChart.   We are unable to tell what your co-pay for medications will be in advance as this is different depending on your insurance coverage. However, we may be able to find a substitute medication at lower cost or fill out paperwork to get insurance to cover a needed medication.   If a prior authorization is required to get your medication covered by your insurance company, please allow us 1-2 business days to complete this process.  Drug prices often vary depending on where the prescription is filled and some pharmacies may offer cheaper prices.  The website www.goodrx.com contains coupons for medications through different pharmacies. The prices here do not account for what the cost may be with help from insurance (it may be cheaper with your insurance), but the website can   give you the price if you did not use any insurance.  - You can print the associated coupon and take it with your prescription to the pharmacy.  - You may also stop by our office during regular business hours and pick up a GoodRx coupon card.  - If you need your prescription sent electronically to a different pharmacy, notify our office through [facility] MyChart or by phone at [phone] option 4.     Si Usted Necesita Algo Despus de Su Visita  Tambin puede  enviarnos un mensaje a travs de MyChart. Por lo general respondemos a los mensajes de MyChart en el transcurso de 1 a 2 das hbiles.  Para renovar recetas, por favor pida a su farmacia que se ponga en contacto con nuestra oficina. Nuestro nmero de fax es el [phone].  Si tiene un asunto urgente cuando la clnica est cerrada y que no puede esperar hasta el siguiente da hbil, puede llamar/localizar a su doctor(a) al nmero que aparece a continuacin.   Por favor, tenga en cuenta que aunque hacemos todo lo posible para estar disponibles para asuntos urgentes fuera del horario de oficina, no estamos disponibles las 24 horas del da, los 7 das de la semana.   Si tiene un problema urgente y no puede comunicarse con nosotros, puede optar por buscar atencin mdica  en el consultorio de su doctor(a), en una clnica privada, en un centro de atencin urgente o en una sala de emergencias.  Si tiene una emergencia mdica, por favor llame inmediatamente al 911 o vaya a la sala de emergencias.  Nmeros de bper  - Dr. Kowalski: [phone]  - Dra. Moye: [phone]  - Dra. Stewart: [phone]  En caso de inclemencias del tiempo, por favor llame a nuestra lnea principal al [phone] para una actualizacin sobre el estado de cualquier retraso o cierre.  Consejos para la medicacin en dermatologa: Por favor, guarde las cajas en las que vienen los medicamentos de uso tpico para ayudarle a seguir las instrucciones sobre dnde y cmo usarlos. Las farmacias generalmente imprimen las instrucciones del medicamento slo en las cajas y no directamente en los tubos del medicamento.   Si su medicamento es muy caro, por favor, pngase en contacto con nuestra oficina llamando al [phone] y presione la opcin 4 o envenos un mensaje a travs de MyChart.   No podemos decirle cul ser su copago por los medicamentos por adelantado ya que esto es diferente dependiendo de la cobertura de su seguro.  Sin embargo, es posible que podamos encontrar un medicamento sustituto a menor costo o llenar un formulario para que el seguro cubra el medicamento que se considera necesario.   Si se requiere una autorizacin previa para que su compaa de seguros cubra su medicamento, por favor permtanos de 1 a 2 das hbiles para completar este proceso.  Los precios de los medicamentos varan con frecuencia dependiendo del lugar de dnde se surte la receta y alguna farmacias pueden ofrecer precios ms baratos.  El sitio web www.goodrx.com tiene cupones para medicamentos de diferentes farmacias. Los precios aqu no tienen en cuenta lo que podra costar con la ayuda del seguro (puede ser ms barato con su seguro), pero el sitio web puede darle el precio si no utiliz ningn seguro.  - Puede imprimir el cupn correspondiente y llevarlo con su receta a la farmacia.  - Tambin puede pasar por nuestra oficina durante el horario de atencin regular y recoger una tarjeta de cupones de GoodRx.  -   Si necesita que su receta se enve electrnicamente a una farmacia diferente, informe a nuestra oficina a travs de MyChart de [place] o por telfono llamando al [phone] y presione la opcin 4.  

## 2022-03-28 ENCOUNTER — Encounter: Payer: Self-pay | Admitting: Dermatology

## 2022-04-19 ENCOUNTER — Ambulatory Visit: Payer: 59 | Attending: Infectious Diseases | Admitting: Infectious Diseases

## 2022-04-19 ENCOUNTER — Encounter: Payer: Self-pay | Admitting: Infectious Diseases

## 2022-04-19 VITALS — BP 107/67 | HR 84 | Temp 97.3°F | Ht 68.0 in | Wt 225.0 lb

## 2022-04-19 DIAGNOSIS — I10 Essential (primary) hypertension: Secondary | ICD-10-CM | POA: Insufficient documentation

## 2022-04-19 DIAGNOSIS — F17210 Nicotine dependence, cigarettes, uncomplicated: Secondary | ICD-10-CM | POA: Diagnosis not present

## 2022-04-19 DIAGNOSIS — B20 Human immunodeficiency virus [HIV] disease: Secondary | ICD-10-CM | POA: Diagnosis present

## 2022-04-19 DIAGNOSIS — F419 Anxiety disorder, unspecified: Secondary | ICD-10-CM | POA: Insufficient documentation

## 2022-04-19 DIAGNOSIS — Z23 Encounter for immunization: Secondary | ICD-10-CM | POA: Diagnosis not present

## 2022-04-19 DIAGNOSIS — G629 Polyneuropathy, unspecified: Secondary | ICD-10-CM | POA: Diagnosis not present

## 2022-04-19 DIAGNOSIS — E785 Hyperlipidemia, unspecified: Secondary | ICD-10-CM | POA: Insufficient documentation

## 2022-04-19 DIAGNOSIS — F32A Depression, unspecified: Secondary | ICD-10-CM | POA: Diagnosis not present

## 2022-04-19 NOTE — Patient Instructions (Signed)
Today you are here for follow up- will do pneumonia shot today

## 2022-04-19 NOTE — Progress Notes (Signed)
NAME: Daryl Shelton  DOB: [birth year]  MRN: [id number]  Date/Time: 04/19/2022 11:39 AM  Pt is here for follow up of HIV Doing well- last seen June 2023 Last Vl < 20 on 01/13/22 and cd4 640 On 100% biktarvy   The following copied from last note Daryl Shelton is a 61 y.o. 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 Past Medical History:  Diagnosis Date   Alcoholism ([place])    Anxiety    Arthritis    Bell's palsy    Depression    HIV (human immunodeficiency virus infection) ([place])    Hypertension    Spinal stenosis-   PSH 02/16/2021 Bilateral L2, L3, L4, L5, S1 laminectomies for decompression of neural elements with partial bilateral L2-3, L3-4, L4-5, L5-S1 medial facetectomies  SH Lives with his husband who is HIV positive and undetectable and is my patient Smoker 1PPD Another sister lives with him No alcohol since Sep 22, 2020 Was drinking before for 30 years Past h/o substance use- clean many years Born [place] in [place] Now in [place] for the past 65yr  Was working in [employer] until Dec 2021.    Family History  Problem Relation Age of Onset   CAD Father    Stomach cancer Sister    Prostate cancer Neg Hx   Twin brother -HIV 5 sisters- 3 of 5 opioid dependence due to back pain 1 sister died of gastric carcinoma  Allergies  Allergen Reactions   Varenicline Other (See Comments)    CNS symptoms - anxiety, agitation     Pollen Extract Other (See Comments)    Congestion    ? Current Outpatient Medications  Medication Sig Dispense  Refill   atorvastatin (LIPITOR) 20 MG tablet Take by mouth.     BIKTARVY 50-200-25 MG TABS tablet TAKE 1 TABLET BY MOUTH DAILY 90 tablet 1   chlorhexidine (PERIDEX) 0.12 % solution 15 mLs 2 (two) times daily.     gabapentin (NEURONTIN) 600 MG tablet Take 1 tablet by mouth 3 (three) times daily.     hydrochlorothiazide (HYDRODIURIL) 25 MG tablet TAKE 1 TABLET(25 MG) BY MOUTH DAILY 90 tablet 3   ketoconazole (NIZORAL) 2 % cream Apply 1 application topically daily. 60 g 2   lisinopril (ZESTRIL) 40 MG tablet Take by mouth.     mirtazapine (REMERON) 30 MG tablet Take by mouth.     nicotine (NICODERM CQ - DOSED IN MG/24 HOURS) 21 mg/24hr patch Place onto the skin.           sertraline (ZOLOFT) 100 MG tablet Take 200 mg by mouth daily.     tadalafil (CIALIS) 10 MG tablet Take 1 tablet (10 mg total) by mouth every other day as needed for erectile dysfunction. 10 tablet 1   terbinafine (LAMISIL) 250 MG tablet Take 1 tablet (250 mg total) by mouth daily. 90 tablet 0   Naprosyn MVT   REVIEW OF SYSTEMS:  Const: negative fever, negative chills, some weight loss Eyes: negative diplopia or visual changes, negative eye pain ENT: negative coryza,  negative sore throat Resp: negative cough, hemoptysis, dyspnea Cards: negative for chest pain, palpitations, lower extremity edema GU: negative for frequency, dysuria and hematuria Skin: negative for rash and pruritus Heme: negative for easy bruising and gum/nose bleeding MS: negative for myalgias, arthralgias, back pain and muscle weakness Neurolo:numbness fingers Psych:  anxiety  Objective:  VITALS:  BP 107/67   Pulse 84   Temp (!) 97.3 F (36.3 C) (Temporal)   Ht '5\' 8"'$  (1.727 m)   Wt 225 lb (102.1 kg)   BMI 34.21 kg/m  PHYSICAL EXAM:  General: looks well.  Head: Normocephalic, without obvious abnormality, atraumatic. Eyes: Conjunctivae clear, anicteric sclerae. Pupils are equal Nose: Nares normal. No drainage or sinus tenderness. Throat:  Lips, mucosa, and tongue normal. No Thrush Neck: symmetrical, no adenopathy, thyroid: non tender no carotid bruit and no JVD. Lungs: Clear to auscultation bilaterally. No Wheezing or Rhonchi. No rales. Heart: Regular rate and rhythm, no murmur, rub or gallop. Abdomen: Soft, non-tender,not distended. Bowel sounds normal. No masses Extremities: Extremities normal, atraumatic, no cyanosis. No edema. No clubbing Skin: No rashes or lesions. Not Jaundiced Lymph: Cervical, supraclavicular normal. Neurologic:numbness hands Health maintenance Vaccination Immunizations   Immunizations Name Administration Dates Next Due  IOEVO-35 VACCINE,MRNA(MODERNA)(PF)(IM) 11/27/2019, 10/28/2019    Hepatitis A 12/29/2010, 04/21/2010    Hepatitis B, Adult 05/10/2017, 04/21/2010, 11/04/2009, 07/08/2009    INFLUENZA INJ MDCK PF, QUAD,(FLUCELVAX)(37MO AND UP EGG FREE) 05/11/2020    INFLUENZA TIV (TRI) PF (IM) 06/23/2011, 04/21/2010, 07/08/2009    Influenza Vaccine Quad (IIV4 PF) 28mo injectable 05/02/2019, 05/07/2018, 05/10/2017, 05/05/2016, 10/01/2015, 08/28/2014, 05/02/2013, 06/28/2012    PNEUMOCOCCAL POLYSACCHARIDE 23 02/26/2015, 02/19/2009    PPD Test 12/29/2010, 11/04/2009    Pneumococcal Conjugate 13-Valent 03/27/2014    SHINGRIX-ZOSTER VACCINE (HZV), RECOMBINANT,SUB-UNIT,ADJUVANTED IM 02/04/2020, 08/06/2019    TdaP 02/19/2009    Tetanus-Diptheria Toxoids-TD(TDVAX),Asdorbed,2LF(IM) 08/06/2019      ______________________  Labs Lab Result  Date comment  HIV VL <20 01/28/21   CD4 925(54%) 01/28/21   Genotype     [id number]     HIV antibody     RPR NR 01/28/21   Quantiferon Gold NR 01/28/21   Hep C ab NR 01/28/21   Hepatitis B-ab,ag,c Sab 124 01/28/21   Hepatitis A-IgM, IgG /T     Lipid     GC/CHL NR-rectum throat 03/11/21   PAP     HB,PLT,Cr, LFT 13.9/181/1.26 02/10/21     Preventive  Procedure Result  Date comment  colonoscopy  06/20/2017   Mammogram     Dental exam     [facility]thal Y 2021 [facility] [place]domen- no aneurysm of aorta 05/21/18 CT lung cancer screen  Impression/Recommendation HIV- - on Biktarvy- well controlled- Vl < 20 on 01/13/22 And cd4 is 640 100% adherent  HTN on lisinopril and HCTZ- controlled  Hyperlipidemia on atorvastatin Needs Lipid labs next blood draw  Anxiety/depression- zoloft, remeron  Spinal steosis- underwent lumbar laminectomies  L2-L5 on 02/16/21-   Peripheral neuropathy Current smoker  Seborrheic warts scalp/trunk- saw Derm and had cryotherapy and resolved   Pt in a monogamous relationship - partner positive and undetectable _________________________________________________ Discussed with patient in detail.  Follow up 6 months- labs 6 months

## 2022-05-05 ENCOUNTER — Encounter: Payer: Self-pay | Admitting: Family Medicine

## 2022-05-06 ENCOUNTER — Other Ambulatory Visit: Payer: Self-pay

## 2022-05-06 DIAGNOSIS — G2581 Restless legs syndrome: Secondary | ICD-10-CM

## 2022-05-06 MED ORDER — ROPINIROLE HCL 1 MG PO TABS: 1.0000 mg | ORAL_TABLET | Freq: Every day | ORAL | 3 refills | Status: DC

## 2022-05-08 ENCOUNTER — Other Ambulatory Visit: Payer: Self-pay | Admitting: Family Medicine

## 2022-05-08 DIAGNOSIS — F17200 Nicotine dependence, unspecified, uncomplicated: Secondary | ICD-10-CM

## 2022-05-09 NOTE — Telephone Encounter (Signed)
Refused Nicoderm Patch refill request because it was discontinued.

## 2022-05-11 ENCOUNTER — Other Ambulatory Visit: Payer: Self-pay

## 2022-05-11 DIAGNOSIS — F17200 Nicotine dependence, unspecified, uncomplicated: Secondary | ICD-10-CM

## 2022-05-11 MED ORDER — NICOTINE 21 MG/24HR TD PT24: 21.0000 mg | MEDICATED_PATCH | Freq: Every day | TRANSDERMAL | 1 refills | Status: DC | PRN

## 2022-06-27 ENCOUNTER — Other Ambulatory Visit: Payer: Self-pay

## 2022-06-27 DIAGNOSIS — R972 Elevated prostate specific antigen [PSA]: Secondary | ICD-10-CM

## 2022-06-28 ENCOUNTER — Other Ambulatory Visit: Payer: 59

## 2022-06-29 LAB — PSA: PSA: 3.03 ng/mL (ref <=4.00)

## 2022-07-07 ENCOUNTER — Encounter: Payer: Self-pay | Admitting: Family Medicine

## 2022-07-07 ENCOUNTER — Ambulatory Visit (INDEPENDENT_AMBULATORY_CARE_PROVIDER_SITE_OTHER): Payer: 59 | Admitting: Family Medicine

## 2022-07-07 VITALS — BP 98/56 | HR 89 | Ht 68.0 in | Wt 231.0 lb

## 2022-07-07 DIAGNOSIS — Z23 Encounter for immunization: Secondary | ICD-10-CM | POA: Diagnosis not present

## 2022-07-07 DIAGNOSIS — R972 Elevated prostate specific antigen [PSA]: Secondary | ICD-10-CM | POA: Insufficient documentation

## 2022-07-07 DIAGNOSIS — I10 Essential (primary) hypertension: Secondary | ICD-10-CM | POA: Diagnosis not present

## 2022-07-07 DIAGNOSIS — E669 Obesity, unspecified: Secondary | ICD-10-CM

## 2022-07-07 MED ORDER — LISINOPRIL 40 MG PO TABS: 40.0000 mg | ORAL_TABLET | Freq: Every day | ORAL | 11 refills | Status: DC

## 2022-07-07 MED ORDER — PHENTERMINE HCL 37.5 MG PO CAPS: 37.5000 mg | ORAL_CAPSULE | ORAL | 2 refills | Status: DC

## 2022-07-07 MED ORDER — HYDROCHLOROTHIAZIDE 25 MG PO TABS: 25.0000 mg | ORAL_TABLET | Freq: Every day | ORAL | 11 refills | Status: DC

## 2022-07-07 NOTE — Assessment & Plan Note (Signed)
Well-controlled HTN - Home BP readings    No known complications    Plan:  1.  Continue current BP regimen Lisinopril '40mg'$  daily and HCTZ '25mg'$  daily 2. Encourage improved lifestyle - low sodium diet, regular exercise 3. Continue monitor BP outside office, bring readings to next visit, if persistently >140/90 or new symptoms notify office sooner

## 2022-07-07 NOTE — Assessment & Plan Note (Signed)
Mild elevated PSA trajectory From 0.9 (2018) up to 3.03 (now 2023), notable increase from 2.3 (6 months ago) now to 3.032 Asymptomatic without dx of BPH or LUTS Discussion today, options include keep repeating PSA, urinalysis however given lack of symptoms, we agree that good precaution would be to proceed w/ consult with Urology for repeat PSA and further evaluation. I will plan to follow him in future for PSA if he is cleared by Urology.

## 2022-07-07 NOTE — Assessment & Plan Note (Signed)
Difficulty with weight gain BMI >35 Limited success with lifestyle modification diet exercise Already tried failed Contrave, some help but weight loss not successful  GLP1 therapy not covered / high price Discussed alternative options We agreed to trial Phentermine 37.'5mg'$  daily for appetite suppression rx wt management, counseled on cardiovascular potential side effects risks, ordered 30 day with 2 refills Follow-up with me in 3 months or sooner if need for further management, discussed not long term therapy in future reconsider GLP1 therapy

## 2022-07-07 NOTE — Progress Notes (Signed)
Subjective:    Patient ID: Daryl Shelton, male    DOB: Mar 07, 1961, 61 y.o.   MRN: [id number]  Daryl Shelton is a 61 y.o. male presenting on 07/07/2022 for Hypertension, lab results, and Obesity   HPI  Elevated PSA Past history with [facility] lab PSA 0.90 (05/2017) Trend increase 2.35 (01/2022) then increase to 3.03 (06/2022) now currently He does not endorse any BPH or LUTS at this time. Urinary habits mostly normal. He has never been on med for it. No fam history prostate CA He is interested to have further eval and repeat lab testing  CHRONIC HTN: Reports no concerns, needs re order today Current Meds - Lisinopril '40mg'$  daily, HCTZ '25mg'$  daily   Reports good compliance, took meds today. Tolerating well, w/o complaints. Denies CP, dyspnea, HA, edema, dizziness / lightheadedness  Obesity BMI >35 Previously trial on Contrave, since GLP1 not covered. He did trial of Saxenda but not available. Weight gain still after treatment on Contrave Failed regular diet exercise regimen Interested in other options.  HIV, chronic, controlled and undetectable Followed by Dr Delaine Lame ([facility]) On Biktarvy     04/19/2022   11:21 AM 01/13/2022   11:28 AM 12/31/2021    2:49 PM  Depression screen PHQ 2/9  Decreased Interest 0 0 0  Down, Depressed, Hopeless 0 1 1  PHQ - 2 Score 0 1 1  Altered sleeping   0  Tired, decreased energy  0 0  Change in appetite  0 1  Feeling bad or failure about yourself   0 1  Trouble concentrating  0 0  Moving slowly or fidgety/restless  0 0  Suicidal thoughts  0 0  PHQ-9 Score   3  Difficult doing work/chores   Not difficult at all    Social History   Tobacco Use   Smoking status: Every Day    Packs/day: 0.50    Years: 40.00    Total pack years: 20.00    Types: Cigarettes   Smokeless tobacco: Never  Vaping Use   Vaping Use: Never used  Substance Use Topics   Alcohol use: Not Currently   Drug use: No    Review of Systems Per HPI unless  specifically indicated above     Objective:    BP (!) 98/56   Pulse 89   Ht '5\' 8"'$  (1.727 m)   Wt 231 lb (104.8 kg)   SpO2 97%   BMI 35.12 kg/m   Wt Readings from Last 3 Encounters:  07/07/22 231 lb (104.8 kg)  04/19/22 225 lb (102.1 kg)  01/13/22 229 lb (103.9 kg)    Physical Exam Vitals and nursing note reviewed.  Constitutional:      General: He is not in acute distress.    Appearance: Normal appearance. He is well-developed. He is obese. He is not diaphoretic.     Comments: Well-appearing, comfortable, cooperative  HENT:     Head: Normocephalic and atraumatic.  Eyes:     General:        Right eye: No discharge.        Left eye: No discharge.     Conjunctiva/sclera: Conjunctivae normal.  Cardiovascular:     Rate and Rhythm: Normal rate.  Pulmonary:     Effort: Pulmonary effort is normal.  Skin:    General: Skin is warm and dry.     Findings: No erythema or rash.  Neurological:     Mental Status: He is alert and oriented to person,  place, and time.  Psychiatric:        Mood and Affect: Mood normal.        Behavior: Behavior normal.        Thought Content: Thought content normal.     Comments: Well groomed, good eye contact, normal speech and thoughts      Results for orders placed or performed in visit on 06/27/22  PSA  Result Value Ref Range   PSA 3.03 < OR = 4.00 ng/mL      Assessment & Plan:   Problem List Items Addressed This Visit     Elevated PSA, less than 10 ng/ml - Primary    Mild elevated PSA trajectory From 0.9 (2018) up to 3.03 (now 2023), notable increase from 2.3 (6 months ago) now to 3.032 Asymptomatic without dx of BPH or LUTS Discussion today, options include keep repeating PSA, urinalysis however given lack of symptoms, we agree that good precaution would be to proceed w/ consult with Urology for repeat PSA and further evaluation. I will plan to follow him in future for PSA if he is cleared by Urology.      Relevant Orders    Ambulatory referral to Urology   Essential hypertension    Well-controlled HTN - Home BP readings    No known complications    Plan:  1.  Continue current BP regimen Lisinopril '40mg'$  daily and HCTZ '25mg'$  daily 2. Encourage improved lifestyle - low sodium diet, regular exercise 3. Continue monitor BP outside office, bring readings to next visit, if persistently >140/90 or new symptoms notify office sooner       Relevant Medications   hydrochlorothiazide (HYDRODIURIL) 25 MG tablet   lisinopril (ZESTRIL) 40 MG tablet   Obesity (BMI 35.0-39.9 without comorbidity)    Difficulty with weight gain BMI >35 Limited success with lifestyle modification diet exercise Already tried failed Contrave, some help but weight loss not successful  GLP1 therapy not covered / high price Discussed alternative options We agreed to trial Phentermine 37.'5mg'$  daily for appetite suppression rx wt management, counseled on cardiovascular potential side effects risks, ordered 30 day with 2 refills Follow-up with me in 3 months or sooner if need for further management, discussed not long term therapy in future reconsider GLP1 therapy      Relevant Medications   phentermine 37.5 MG capsule   Other Visit Diagnoses     Needs flu shot       Relevant Orders   Flu Vaccine QUAD 6+ mos PF IM (Fluarix Quad PF) (Completed)       Referral sent to [facility]  Orders Placed This Encounter  Procedures   Flu Vaccine QUAD 6+ mos PF IM (Fluarix Quad PF)   Ambulatory referral to Urology    Referral Priority:   Routine    Referral Type:   Consultation    Referral Reason:   Specialty Services Required    Requested Specialty:   Urology    Number of Visits Requested:   1     Meds ordered this encounter  Medications   hydrochlorothiazide (HYDRODIURIL) 25 MG tablet    Sig: Take 1 tablet (25 mg total) by mouth daily.    Dispense:  30 tablet    Refill:  11   lisinopril (ZESTRIL) 40 MG tablet    Sig: Take 1 tablet (40 mg  total) by mouth daily.    Dispense:  30 tablet    Refill:  11   phentermine 37.5 MG capsule  Sig: Take 1 capsule (37.5 mg total) by mouth every morning.    Dispense:  30 capsule    Refill:  2     Follow up plan: Return in about 3 months (around 10/06/2022) for 3 month Weight Management, updates Urology.   Nobie Putnam, [facility] 07/07/2022, 10:14 AM

## 2022-07-07 NOTE — Patient Instructions (Addendum)
Thank you for coming to the office today.  Ordered Phentermine, take once daily, it may raise effectiveness / dose of sertraline. If you don't like how this feels, we can adjust sertraline.  Keep a close watch on your BP.   ---------- Call insurance find cost and coverage of the following - check the following: - Drug Tier, Preferred List, On Formulary - All will require a "Prior Authorization" from [place] first, before you can find out the cost - Find out if there is "Step Therapy" (other medicines required before you can try these)  Once you pick the one you want to try, let me know - we can get a sample ready IF we have it in stock. Then try it - and before running out of medicine, contact me back to order your Rx so we have time to get it processed.  For Weight Loss / Obesity only  Wegovy (same as Ozempic) weekly injection - start 0.'25mg'$  weekly, 1 dose per pen, single use, auto-injector  2. Saxenda - DAILY injection - start 0.'6mg'$  injection DAILY, you can increase the dose by 1 notch or 0.6 mg per week, if you don't tolerate a dose, can reduce it the next day.  3. Contrave - oral medication, appetite suppression has wellbutrin/bupropion and naltrexone in it and it can also help with appetite, it is ordered through a speciality pharmacy.  4. Phentermine - oral medication, older generation med, "burning medication" speeds up heart / blood pressure etc, but generally fairly well tolerated for short term. 1-3 months approximately. Affordable and generic.  Future make sure your insurance has weight loss coverage  ---------------------------------------------  Referred to Urology for Prostate evaluation  Previous numbers 0.9 (2018) > 2.35 (01/2022) > 3.03 (06/2022)  [facility] [place] -[address]st floor [place],  [place]  [address] Phone: [phone]   Please schedule a Follow-up Appointment to: Return in about 3 months (around  10/06/2022) for 3 month Weight Management, updates Urology.  If you have any other questions or concerns, please feel free to call the office or send a message through [place]. You may also schedule an earlier appointment if necessary.  Additionally, you may be receiving a survey about your experience at our office within a few days to 1 week by e-mail or mail. We value your feedback.  Nobie Putnam, DO [place]

## 2022-07-20 ENCOUNTER — Ambulatory Visit (INDEPENDENT_AMBULATORY_CARE_PROVIDER_SITE_OTHER): Payer: 59 | Admitting: Urology

## 2022-07-20 VITALS — BP 111/75 | HR 92 | Ht 68.0 in | Wt 235.2 lb

## 2022-07-20 DIAGNOSIS — R972 Elevated prostate specific antigen [PSA]: Secondary | ICD-10-CM

## 2022-07-20 NOTE — Progress Notes (Signed)
07/20/2022 12:50 PM   Daryl Shelton 1961/02/13 [id number]  Referring provider: Olin Hauser, DO [address] [place],  [place] [address]  Chief Complaint  Patient presents with   Establish Care   Elevated PSA    HPI: Extremely pleasant 61 year old male who presents today for further evaluation of rising PSA.  See PSA trend below.  He denies any baseline urinary symptoms.  He does mention today that he is newly worried and engages in anal intercourse.  He wonders if there is a contributing factor.  Family history of prostate cancer   PSA  Latest Ref Rng < OR = 4.00 ng/mL  12/22/2021 2.35   06/28/2022 3.03      PMH: Past Medical History:  Diagnosis Date   Alcoholism ([place])    Anxiety    Arthritis    Bell's palsy    Depression    HIV (human immunodeficiency virus infection) ([place])    Hypertension     Surgical History: Past Surgical History:  Procedure Laterality Date   LUMBAR LAMINECTOMY/DECOMPRESSION MICRODISCECTOMY N/A 02/16/2021   Procedure: OPEN LUMBAR LAMINECTOMY, LUMBAR TWO-THREE, LUMBAR THREE-FOUR, LUMBAR FOUR-FIVE,LUMBAR FIVE-SACRAL ONE;  Surgeon: Karsten Ro, DO;  Location: [facility];  Service: Neurosurgery;  Laterality: N/A;    Home Medications:  Allergies as of 07/20/2022       Reactions   Varenicline Other (See Comments)   CNS symptoms - anxiety, agitation   Pollen Extract Other (See Comments)   Congestion        Medication List        Accurate as of July 20, 2022 11:59 PM. If you have any questions, ask your nurse or doctor.          Biktarvy 50-200-25 MG Tabs tablet Generic drug: bictegravir-emtricitabine-tenofovir AF Take 1 tablet by mouth daily.   chlorhexidine 0.12 % solution Commonly known as: PERIDEX Use as directed 15 mLs in the mouth or throat daily.   gabapentin 600 MG tablet Commonly known as: NEURONTIN Take 1.5 tablets (900 mg total) by mouth 3 (three) times daily.   hydrochlorothiazide 25 MG  tablet Commonly known as: HYDRODIURIL Take 1 tablet (25 mg total) by mouth daily.   lisinopril 40 MG tablet Commonly known as: ZESTRIL Take 1 tablet (40 mg total) by mouth daily.   Multivitamin Adults 50+ Tabs Take 1 tablet by mouth daily.   nicotine 21 mg/24hr patch Commonly known as: NICODERM CQ - dosed in mg/24 hours Place 1 patch (21 mg total) onto the skin daily as needed (nicontine dependence).   phentermine 37.5 MG capsule Take 1 capsule (37.5 mg total) by mouth every morning.   rOPINIRole 1 MG tablet Commonly known as: REQUIP Take 1 tablet (1 mg total) by mouth at bedtime.   sertraline 100 MG tablet Commonly known as: ZOLOFT Take 2 tablets (200 mg total) by mouth daily.   tadalafil 10 MG tablet Commonly known as: CIALIS Take 1 tablet (10 mg total) by mouth every other day as needed for erectile dysfunction.        Allergies:  Allergies  Allergen Reactions   Varenicline Other (See Comments)    CNS symptoms - anxiety, agitation     Pollen Extract Other (See Comments)    Congestion     Family History: Family History  Problem Relation Age of Onset   CAD Father    Stomach cancer Sister    Prostate cancer Neg Hx     Social History:  reports that he has  been smoking cigarettes. He has a 20.00 pack-year smoking history. He has never used smokeless tobacco. He reports that he does not currently use alcohol. He reports that he does not use drugs.   Physical Exam: BP 111/75   Pulse 92   Ht '5\' 8"'$  (1.727 m)   Wt 235 lb 4 oz (106.7 kg)   BMI 35.77 kg/m   Constitutional:  Alert and oriented, No acute distress. HEENT: [employer] AT, moist mucus membranes.  Trachea midline, no masses. Cardiovascular: No clubbing, cyanosis, or edema. Respiratory: Normal respiratory effort, no increased work of breathing. Rectal exam: Normal sphincter tone.  Enlarged prostate, greater than 50 cc, nontender, no nodules. Neurologic: Grossly intact, no focal deficits, moving all 4  extremities. Psychiatric: Normal mood and affect.  Laboratory Data: Lab Results  Component Value Date   WBC 4.8 01/13/2022   HGB 14.7 01/13/2022   HCT 44.3 01/13/2022   MCV 90 01/13/2022   PLT 192 01/13/2022    Lab Results  Component Value Date   CREATININE 1.26 12/22/2021    Lab Results  Component Value Date   PSA 3.03 06/28/2022   PSA 2.35 12/22/2021   Lab Results  Component Value Date   HGBA1C 5.2 12/22/2021     Assessment & Plan:    1. Elevated PSA  We reviewed the implications of an elevated PSA and the uncertainty surrounding it. In general, a man's PSA increases with age and is produced by both normal and cancerous prostate tissue. Differential for elevated PSA is BPH, prostate cancer, infection, recent intercourse/ejaculation, prostate infarction, recent urethroscopic manipulation (foley placement/cystoscopy) and prostatitis. Management of an elevated PSA can include observation or prostate biopsy and wediscussed this in detail. We discussed that indications for prostate biopsy are defined by age and race specific PSA cutoffs as well as a PSA velocity of 0.75/year.  PSA is trending upwards however still within the normal range  Rectal exam today is reassuring, has large prostate on exam but otherwise is asymptomatic and no nodules.  I recommended rigid repeat lab in 3 months, depending on trend, will will devise follow-up plan thereafter.  He is agreeable this plan.  Will call him in 3 months with his result and follow-up recommendations. - PSA; Future   Return for f/u lab 3 months.  Hollice Espy, MD  [facility] [address], [place], [place] [address] [phone]

## 2022-07-20 NOTE — Patient Instructions (Signed)
We will call you with lab results.  PSA lab in 3 months.

## 2022-08-04 ENCOUNTER — Encounter: Payer: Self-pay | Admitting: Family Medicine

## 2022-08-08 ENCOUNTER — Encounter: Payer: Self-pay | Admitting: Family Medicine

## 2022-08-09 ENCOUNTER — Other Ambulatory Visit: Payer: Self-pay

## 2022-08-09 DIAGNOSIS — F17200 Nicotine dependence, unspecified, uncomplicated: Secondary | ICD-10-CM

## 2022-08-09 MED ORDER — NICOTINE 21 MG/24HR TD PT24: 21.0000 mg | MEDICATED_PATCH | Freq: Every day | TRANSDERMAL | 1 refills | Status: DC | PRN

## 2022-08-19 ENCOUNTER — Other Ambulatory Visit: Payer: Self-pay

## 2022-08-19 MED ORDER — BIKTARVY 50-200-25 MG PO TABS: 1.0000 | ORAL_TABLET | Freq: Every day | ORAL | 4 refills | Status: DC

## 2022-08-23 ENCOUNTER — Encounter: Payer: Self-pay | Admitting: Family Medicine

## 2022-08-23 ENCOUNTER — Other Ambulatory Visit: Payer: Self-pay

## 2022-08-23 DIAGNOSIS — G2581 Restless legs syndrome: Secondary | ICD-10-CM

## 2022-08-23 MED ORDER — ROPINIROLE HCL 1 MG PO TABS: 1.0000 mg | ORAL_TABLET | Freq: Every day | ORAL | 3 refills | Status: DC

## 2022-09-15 ENCOUNTER — Encounter: Payer: Self-pay | Admitting: Family Medicine

## 2022-10-06 ENCOUNTER — Ambulatory Visit: Payer: 59 | Admitting: Family Medicine

## 2022-10-06 ENCOUNTER — Encounter: Payer: Self-pay | Admitting: Family Medicine

## 2022-10-06 VITALS — BP 110/70 | HR 93 | Ht 68.0 in | Wt 230.0 lb

## 2022-10-06 DIAGNOSIS — M48061 Spinal stenosis, lumbar region without neurogenic claudication: Secondary | ICD-10-CM | POA: Diagnosis not present

## 2022-10-06 DIAGNOSIS — G629 Polyneuropathy, unspecified: Secondary | ICD-10-CM | POA: Diagnosis not present

## 2022-10-06 DIAGNOSIS — F3341 Major depressive disorder, recurrent, in partial remission: Secondary | ICD-10-CM | POA: Diagnosis not present

## 2022-10-06 DIAGNOSIS — F411 Generalized anxiety disorder: Secondary | ICD-10-CM

## 2022-10-06 DIAGNOSIS — E669 Obesity, unspecified: Secondary | ICD-10-CM

## 2022-10-06 MED ORDER — DULOXETINE HCL 30 MG PO CPEP: 30.0000 mg | ORAL_CAPSULE | Freq: Every day | ORAL | 2 refills | Status: DC

## 2022-10-06 NOTE — Patient Instructions (Addendum)
Thank you for coming to the office today.  Medication Taper Off Instructions Current med - Sertraline '100mg'$  x 2 = '200mg'$    Week 1-2: Alternate every OTHER day - 200 mg (2 tabs) and then next day 100 mg (1 tab) Week 3: 100 mg daily (1 tablet) Week 4: 50 mg every day STOP completely  Start new med Duloxetine '30mg'$  capsule daily  1 to 2 weeks into Duloxetine dose, can gradually start reducing Neurontin  First dose reduction = '600mg'$  3 times a day (drop the half pills)  Next can consider dropping more gradual to '300mg'$  3 times a day.  Pause and then re-evaluate, may need dose increase Duloxetine to '60mg'$   And can re order the Gabapentin/Neurontin for a '300mg'$  in one dose.  Let me know if need Bariatric Referral in future.  Labs AFTER we discuss. We will check labs from Dr Josefa Half labs  -------  Leg cramps - increase water intake  - Try spoonful of yellow mustard to relieve leg cramps or try daily to prevent the problem  - OTC natural option is Hyland's Leg Cramps (Dissolving tablet) take as needed for muscle cramps   Please schedule a Follow-up Appointment to: Return in about 3 months (around 01/04/2023) for 3 month Annual Physical AM fasting lab AFTER.  If you have any other questions or concerns, please feel free to call the office or send a message through [place]. You may also schedule an earlier appointment if necessary.  Additionally, you may be receiving a survey about your experience at our office within a few days to 1 week by e-mail or mail. We value your feedback.  Nobie Putnam, DO [place]

## 2022-10-06 NOTE — Progress Notes (Signed)
Subjective:    Patient ID: Daryl Shelton, male    DOB: 08/25/1960, 62 y.o.   MRN: [id number]  Daryl Shelton is a 62 y.o. male presenting on 10/06/2022 for Weight Check   HPI  Elevated PSA Past history with [facility] lab PSA 0.90 (05/2017) Trend increase 2.35 (01/2022) then increase to 3.03 (06/2022) with velocity of 0.75 inc within 1 year He does not endorse any BPH or LUTS at this time. Urinary habits mostly normal. He has never been on med for it. No fam history prostate CA  Now established w/ [facility] Urology Dr Erlene Quan recently, she did not have significant concern, and he is due for next visit March 2024   CHRONIC HTN: Reports no concerns, needs re order today Current Meds - Lisinopril '40mg'$  daily, HCTZ '25mg'$  daily   Reports good compliance, took meds today. Tolerating well, w/o complaints. Denies CP, dyspnea, HA, edema, dizziness / lightheadedness   Obesity BMI >35 Previously trial on Contrave, since GLP1 not covered. He did trial of Saxenda but not available. Weight gain still after treatment on Contrave Also tried Phentermine 37.'5mg'$  without significant appetite suppression  Failed regular diet exercise regimen Interested in referral now instead of med, since limited options      10/06/2022   10:02 AM 04/19/2022   11:21 AM 01/13/2022   11:28 AM  Depression screen PHQ 2/9  Decreased Interest 0 0 0  Down, Depressed, Hopeless 1 0 1  PHQ - 2 Score 1 0 1  Altered sleeping 0    Tired, decreased energy 1  0  Change in appetite 1  0  Feeling bad or failure about yourself  0  0  Trouble concentrating 0  0  Moving slowly or fidgety/restless 0  0  Suicidal thoughts 0  0  PHQ-9 Score 3    Difficult doing work/chores Not difficult at all      Social History   Tobacco Use   Smoking status: Every Day    Packs/day: 0.50    Years: 40.00    Total pack years: 20.00    Types: Cigarettes   Smokeless tobacco: Never  Vaping Use   Vaping Use: Never used  Substance Use Topics    Alcohol use: Not Currently   Drug use: No    Review of Systems Per HPI unless specifically indicated above     Objective:    BP 110/70   Pulse 93   Ht '5\' 8"'$  (1.727 m)   Wt 230 lb (104.3 kg)   SpO2 97%   BMI 34.97 kg/m   Wt Readings from Last 3 Encounters:  10/06/22 230 lb (104.3 kg)  07/20/22 235 lb 4 oz (106.7 kg)  07/07/22 231 lb (104.8 kg)    Physical Exam Vitals and nursing note reviewed.  Constitutional:      General: He is not in acute distress.    Appearance: Normal appearance. He is well-developed. He is not diaphoretic.     Comments: Well-appearing, comfortable, cooperative  HENT:     Head: Normocephalic and atraumatic.  Eyes:     General:        Right eye: No discharge.        Left eye: No discharge.     Conjunctiva/sclera: Conjunctivae normal.  Cardiovascular:     Rate and Rhythm: Normal rate.  Pulmonary:     Effort: Pulmonary effort is normal.  Skin:    General: Skin is warm and dry.     Findings: No erythema  or rash.  Neurological:     Mental Status: He is alert and oriented to person, place, and time.  Psychiatric:        Mood and Affect: Mood normal.        Behavior: Behavior normal.        Thought Content: Thought content normal.     Comments: Well groomed, good eye contact, normal speech and thoughts    Results for orders placed or performed in visit on 06/27/22  PSA  Result Value Ref Range   PSA 3.03 < OR = 4.00 ng/mL      Assessment & Plan:   Problem List Items Addressed This Visit     GAD (generalized anxiety disorder)   Relevant Medications   DULoxetine (CYMBALTA) 30 MG capsule   Major depressive disorder, recurrent, in partial remission (HCC)   Relevant Medications   DULoxetine (CYMBALTA) 30 MG capsule   Neuropathy   Relevant Medications   DULoxetine (CYMBALTA) 30 MG capsule   Other Visit Diagnoses     Obesity (BMI 30.0-34.9)    -  Primary   Spinal stenosis of lumbar region, unspecified whether neurogenic claudication  present       Relevant Medications   DULoxetine (CYMBALTA) 30 MG capsule       Major Depression Recurrent GAD On Sertraline for years, chronic therapy with seems less effective results lately Switch to Duloxetine for hopeful benefit of chronic pain  / neuropathy treatment  Additionally can reduce Gabapentin due to side effect sedation/groggy   Medication Taper Off Instructions Current med - Sertraline '100mg'$  x 2 = '200mg'$    Week 1-2: Alternate every OTHER day - 200 mg (2 tabs) and then next day 100 mg (1 tab) Week 3: 100 mg daily (1 tablet) Week 4: 50 mg every day STOP completely  Start new med Duloxetine '30mg'$  capsule daily  1 to 2 weeks into Duloxetine dose, can gradually start reducing Neurontin  First dose reduction = '600mg'$  3 times a day (drop the half pills)  Next can consider dropping more gradual to '300mg'$  3 times a day.  Pause and then re-evaluate, may need dose increase Duloxetine to '60mg'$   And can re order the Gabapentin/Neurontin for a '300mg'$  in one dose.  Weight Management Phentermine 37.5 ineffective, will pause for now instead of re order Let me know if need Bariatric Referral in future.  Labs AFTER we discuss. We will check labs from Dr Josefa Half labs  -------  Leg cramps - increase water intake  - Try spoonful of yellow mustard to relieve leg cramps or try daily to prevent the problem  - OTC natural option is Hyland's Leg Cramps (Dissolving tablet) take as needed for muscle cramps  Meds ordered this encounter  Medications   DULoxetine (CYMBALTA) 30 MG capsule    Sig: Take 1 capsule (30 mg total) by mouth daily.    Dispense:  30 capsule    Refill:  2     Follow up plan: Return in about 3 months (around 01/04/2023) for 3 month Annual Physical AM fasting lab AFTER.  Add Magnesium to lab  Nobie Putnam, [facility] 10/06/2022, 10:09 AM

## 2022-10-18 ENCOUNTER — Other Ambulatory Visit (HOSPITAL_COMMUNITY)
Admission: RE | Admit: 2022-10-18 | Discharge: 2022-10-18 | Disposition: A | Discharge status: Home / Self Care | Payer: 59 | Source: Ambulatory Visit

## 2022-10-18 ENCOUNTER — Telehealth: Payer: Self-pay

## 2022-10-18 ENCOUNTER — Ambulatory Visit: Payer: 59 | Attending: Infectious Diseases | Admitting: Infectious Diseases

## 2022-10-18 ENCOUNTER — Encounter: Payer: Self-pay | Admitting: Infectious Diseases

## 2022-10-18 ENCOUNTER — Other Ambulatory Visit
Admission: RE | Admit: 2022-10-18 | Discharge: 2022-10-18 | Disposition: A | Discharge status: Home / Self Care | Payer: 59 | Source: Ambulatory Visit | Attending: Infectious Diseases | Admitting: Infectious Diseases

## 2022-10-18 VITALS — BP 117/77 | HR 88 | Temp 97.3°F | Ht 68.0 in | Wt 233.0 lb

## 2022-10-18 DIAGNOSIS — L219 Seborrheic dermatitis, unspecified: Secondary | ICD-10-CM | POA: Diagnosis not present

## 2022-10-18 DIAGNOSIS — I10 Essential (primary) hypertension: Secondary | ICD-10-CM | POA: Insufficient documentation

## 2022-10-18 DIAGNOSIS — F419 Anxiety disorder, unspecified: Secondary | ICD-10-CM | POA: Insufficient documentation

## 2022-10-18 DIAGNOSIS — Z21 Asymptomatic human immunodeficiency virus [HIV] infection status: Secondary | ICD-10-CM | POA: Insufficient documentation

## 2022-10-18 DIAGNOSIS — Z79899 Other long term (current) drug therapy: Secondary | ICD-10-CM | POA: Insufficient documentation

## 2022-10-18 DIAGNOSIS — F32A Depression, unspecified: Secondary | ICD-10-CM | POA: Insufficient documentation

## 2022-10-18 DIAGNOSIS — B20 Human immunodeficiency virus [HIV] disease: Secondary | ICD-10-CM | POA: Insufficient documentation

## 2022-10-18 DIAGNOSIS — G629 Polyneuropathy, unspecified: Secondary | ICD-10-CM | POA: Diagnosis not present

## 2022-10-18 DIAGNOSIS — E785 Hyperlipidemia, unspecified: Secondary | ICD-10-CM | POA: Insufficient documentation

## 2022-10-18 DIAGNOSIS — F17210 Nicotine dependence, cigarettes, uncomplicated: Secondary | ICD-10-CM | POA: Insufficient documentation

## 2022-10-18 LAB — COMPREHENSIVE METABOLIC PANEL
ALT: 31 U/L (ref 0–44)
AST: 33 U/L (ref 15–41)
Albumin: 4.3 g/dL (ref 3.5–5.0)
Alkaline Phosphatase: 67 U/L (ref 38–126)
Anion gap: 9 (ref 5–15)
BUN: 20 mg/dL (ref 8–23)
CO2: 28 mmol/L (ref 22–32)
Calcium: 9.6 mg/dL (ref 8.9–10.3)
Chloride: 99 mmol/L (ref 98–111)
Creatinine, Ser: 1.28 mg/dL — ABNORMAL HIGH (ref 0.61–1.24)
GFR, Estimated: >60 mL/min (ref >60)
Glucose, Bld: 100 mg/dL — ABNORMAL HIGH (ref 70–99)
Potassium: 4.5 mmol/L (ref 3.5–5.1)
Sodium: 136 mmol/L (ref 135–145)
Total Bilirubin: 0.8 mg/dL (ref 0.3–1.2)
Total Protein: 7.9 g/dL (ref 6.5–8.1)

## 2022-10-18 LAB — CHLAMYDIA/NGC RT PCR (ARMC ONLY)
Chlamydia Tr: NOT DETECTED
Chlamydia Tr: NOT DETECTED
N gonorrhoeae: NOT DETECTED
N gonorrhoeae: NOT DETECTED

## 2022-10-18 MED ORDER — BIKTARVY 50-200-25 MG PO TABS: 1.0000 | ORAL_TABLET | Freq: Every day | ORAL | 6 refills | Status: DC

## 2022-10-18 NOTE — Progress Notes (Signed)
NAME: Daryl Shelton  DOB: Dec 02, 1960  MRN: [id number]  Date/Time: 10/18/2022 9:17 AM  Pt is here for follow up of HIV Doing well- last seen sept 2023 Last Vl < 20 on 01/13/22 and cd4 640 On 100% biktarvy  Since I last saw him he went to see urologist for increased PSA- mildly enlarged prostate. But no other concern  The following copied from last note Daryl Shelton is a 62 y.o. 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 Pt was  at [facility], until 2021 Nadir Cd4 unknown-as far as I can see [facility] records- 08/19/10- 629 OI -none  HAARt history Genvoya Dont know the medicine he took before Acquired thru sex with men  [facility]  Pt has one male partner- his husband, who is positive and on HAART and undetectable Monogamous relationship ? Past Medical History:  Diagnosis Date   Alcoholism ([place])    Anxiety    Arthritis    Bell's palsy    Depression    HIV (human immunodeficiency virus infection) ([place])    Hypertension    Spinal stenosis-   PSH 02/16/2021 Bilateral L2, L3, L4, L5, S1 laminectomies for decompression of neural elements with partial bilateral L2-3, L3-4, L4-5, L5-S1 medial facetectomies  SH Smoker 1/2 PPD No alcohol since Sep 22, 2020 Was drinking before for 30 years Past h/o substance use- clean many years Born [place] Now in [place] for the past 77yr  [place]    Family History  Problem Relation Age of Onset   CAD Father    Stomach cancer Sister    Prostate cancer Neg Hx   Twin brother -HIV 5 sisters- 3 of 5 opioid dependence due to back pain 1 sister died of gastric carcinoma  Allergies  Allergen Reactions   Varenicline Other (See Comments)    CNS symptoms - anxiety, agitation     Pollen Extract Other (See Comments)    Congestion    ? Current Outpatient  Medications  Medication Sig Dispense Refill   atorvastatin (LIPITOR) 20 MG tablet Take by mouth.     BIKTARVY 50-200-25 MG TABS tablet TAKE 1 TABLET BY MOUTH DAILY 90 tablet 1   chlorhexidine (PERIDEX) 0.12 % solution 15 mLs 2 (two) times daily.     gabapentin (NEURONTIN) 600 MG tablet Take 1 tablet by mouth 3 (three) times daily.     hydrochlorothiazide (HYDRODIURIL) 25 MG tablet TAKE 1 TABLET(25 MG) BY MOUTH DAILY 90 tablet 3   ketoconazole (NIZORAL) 2 % cream Apply 1 application topically daily. 60 g 2   lisinopril (ZESTRIL) 40 MG tablet Take by mouth.           nicotine (NICODERM CQ - DOSED IN MG/24 HOURS) 21 mg/24hr patch Place onto the skin.           sertraline (ZOLOFT) 100 MG tablet Take 200 mg by mouth daily.     tadalafil (CIALIS) 10 MG tablet Take 1 tablet (10 mg total) by mouth every other day as needed for erectile dysfunction. 10 tablet 1      0   Naprosyn MVT   REVIEW OF SYSTEMS:  Const: negative fever, negative chills, some weight loss Eyes: negative diplopia or visual changes, negative eye pain ENT: negative coryza, negative sore throat Resp: negative cough, hemoptysis, dyspnea Cards: negative for chest pain, palpitations, lower  extremity edema GU: negative for frequency, dysuria and hematuria Skin: negative for rash and pruritus Heme: negative for easy bruising and gum/nose bleeding [id number] back pain Neurolo:numbness fingers Psych:  anxiety  Objective:  VITALS:  BP 117/77   Pulse 88   Temp (!) 97.3 F (36.3 C) (Temporal)   Ht '5\' 8"'$  (1.727 m)   Wt 233 lb (105.7 kg)   BMI 35.43 kg/m  PHYSICAL EXAM:  General: looks well.  Head: Normocephalic, without obvious abnormality, atraumatic. Eyes: Conjunctivae clear, anicteric sclerae. Pupils are equal Nose: Nares normal. No drainage or sinus tenderness. Throat: Lips, mucosa, and tongue normal. No Thrush Neck: symmetrical, no adenopathy, thyroid: non tender no carotid bruit and no JVD. Lungs: Clear to  auscultation bilaterally. No Wheezing or Rhonchi. No rales. Heart: Regular rate and rhythm, no murmur, rub or gallop. Abdomen: Soft, non-tender,not distended. Bowel sounds normal. No masses Extremities: Extremities normal, atraumatic, no cyanosis. No edema. No clubbing Skin: No rashes or lesions. Not Jaundiced Lymph: Cervical, supraclavicular normal. Neurologic:non focal Health maintenance Vaccination Immunizations   Immunizations Name Administration Dates Next Due  [id number] VACCINE,MRNA(MODERNA)(PF)(IM) 11/27/2019, 10/28/2019    Hepatitis A 12/29/2010, 04/21/2010    Hepatitis B, Adult 05/10/2017, 04/21/2010, 11/04/2009, 07/08/2009    INFLUENZA INJ MDCK PF, QUAD,(FLUCELVAX)(68MO AND UP EGG FREE) 05/11/2020    INFLUENZA TIV (TRI) PF (IM) 06/23/2011, 04/21/2010, 07/08/2009    Influenza Vaccine Quad (IIV4 PF) 22mo injectable 05/02/2019, 05/07/2018, 05/10/2017, 05/05/2016, 10/01/2015, 08/28/2014, 05/02/2013, 06/28/2012    PNEUMOCOCCAL POLYSACCHARIDE 23 02/26/2015, 02/19/2009    PPD Test 12/29/2010, 11/04/2009    Pneumococcal Conjugate 13-Valent 03/27/2014    SHINGRIX-ZOSTER VACCINE (HZV), RECOMBINANT,SUB-UNIT,ADJUVANTED IM 02/04/2020, 08/06/2019    TdaP 02/19/2009    Tetanus-Diptheria Toxoids-TD(TDVAX),Asdorbed,2LF(IM) 08/06/2019      ______________________  Labs Lab Result  Date comment  HIV VL <20 6/23   CD4 >600 6/23   Genotype     [id number]     HIV antibody     RPR NR 6/23/   Quantiferon Gold NR 6/23/   Hep C ab NR 6/23/   Hepatitis B-ab,ag,c Sab 124 01/28/21   Hepatitis A-IgM, IgG /T     Lipid     GC/CHL NR-rectum throat 03/11/21   PAP     HB,PLT,Cr, LFT       Preventive  Procedure Result  Date comment  colonoscopy  06/20/2017   Mammogram     Dental exam     Opthal Y 2021 [facility] [place] no aneurysm of aorta 05/21/18 CT lung cancer screen  Impression/Recommendation HIV- - on Biktarvy- well controlled- Vl < 20 on 01/13/22 And  cd4 is 640 100% adherent Labs today  HTN on lisinopril and HCTZ- controlled  Hyperlipidemia on atorvastatin Last screen on 12/22/21 Ldl 103 TC 177 HDL 51  Anxiety/depression- zoloft, DC remeron  Spinal steosis- underwent lumbar laminectomies  L2-L5 on 02/16/21-   Peripheral neuropathy Current smoker- reduced to 1/2 PPD, also uses nicotine patch  Seborrheic warts scalp/trunk- saw Derm and had cryotherapy and resolved   Pt in a monogamous relationship - partner positive and undetectable GC/Chl/Anal pap today _________________________________________________ Discussed with patient in detail.  Follow up 6 months-

## 2022-10-18 NOTE — Telephone Encounter (Signed)
Patient informed of labs and verbalized understanding.

## 2022-10-18 NOTE — Telephone Encounter (Signed)
-----   Message from Tsosie Billing, MD sent at 10/18/2022 12:45 PM EDT ----- Please let him know the GC/Chl screen was fine ( he was worried that I would give him bad news) - ANAl pap and other labs not available yet. thx ----- Message ----- From: Buel Ream, Lab In [place] Sent: 10/18/2022  10:50 AM EDT To: Tsosie Billing, MD

## 2022-10-19 LAB — T-HELPER CELLS CD4/CD8 %
% CD 4 Pos. Lymph.: 53 % (ref 30.8–58.5)
Absolute CD 4 Helper: 795 /uL (ref 359–1519)
Basophils Absolute: 0 10*3/uL (ref 0.0–0.2)
Basos: 1 %
CD3+CD4+ Cells/CD3+CD8+ Cells Bld: 2.57 (ref 0.92–3.72)
CD3+CD8+ Cells # Bld: 309 /uL (ref 109–897)
CD3+CD8+ Cells NFr Bld: 20.6 % (ref 12.0–35.5)
EOS (ABSOLUTE): 0.1 10*3/uL (ref 0.0–0.4)
Eos: 2 %
Hematocrit: 44.7 % (ref 37.5–51.0)
Hemoglobin: 15.5 g/dL (ref 13.0–17.7)
Immature Grans (Abs): 0.1 10*3/uL (ref 0.0–0.1)
Immature Granulocytes: 3 %
Lymphocytes Absolute: 1.5 10*3/uL (ref 0.7–3.1)
Lymphs: 31 %
MCH: 31.9 pg (ref 26.6–33.0)
MCHC: 34.7 g/dL (ref 31.5–35.7)
MCV: 92 fL (ref 79–97)
Monocytes Absolute: 0.4 10*3/uL (ref 0.1–0.9)
Monocytes: 8 %
Neutrophils Absolute: 2.5 10*3/uL (ref 1.4–7.0)
Neutrophils: 55 %
Platelets: 170 10*3/uL (ref 150–450)
RBC: 4.86 x10E6/uL (ref 4.14–5.80)
RDW: 14 % (ref 11.6–15.4)
WBC: 4.7 10*3/uL (ref 3.4–10.8)

## 2022-10-19 LAB — HIV-1 RNA QUANT-NO REFLEX-BLD
HIV 1 RNA Quant: 110 copies/mL
LOG10 HIV-1 RNA: 2.041 log10copy/mL

## 2022-10-19 LAB — RPR: RPR Ser Ql: NONREACTIVE

## 2022-10-20 ENCOUNTER — Other Ambulatory Visit: Payer: 59

## 2022-10-20 ENCOUNTER — Telehealth: Payer: Self-pay

## 2022-10-20 DIAGNOSIS — R972 Elevated prostate specific antigen [PSA]: Secondary | ICD-10-CM

## 2022-10-20 LAB — CYTOLOGY - PAP: Diagnosis: NEGATIVE

## 2022-10-20 NOTE — Telephone Encounter (Signed)
Patient advised of all lab results and verbalized understanding. Patient also advised to increase his water intake as well and not to miss doses of his medication. Daryl Shelton

## 2022-10-20 NOTE — Telephone Encounter (Signed)
-----   Message from Tsosie Billing, MD sent at 10/20/2022  4:38 PM EDT ----- Please let him know VL is 110 ( was < 20) ask him not to miss any doses- Cd4 is great at 750 Mild increase in creatinine 1.27- need to improve hydration. thx ----- Message ----- From: Buel Ream, Lab In [place] Sent: 10/18/2022  10:50 AM EDT To: Tsosie Billing, MD

## 2022-10-21 LAB — PSA: Prostate Specific Ag, Serum: 3.7 ng/mL (ref 0.0–4.0)

## 2022-10-24 ENCOUNTER — Telehealth: Payer: Self-pay

## 2022-10-24 ENCOUNTER — Encounter: Payer: Self-pay | Admitting: Urology

## 2022-10-24 DIAGNOSIS — R972 Elevated prostate specific antigen [PSA]: Secondary | ICD-10-CM

## 2022-10-24 NOTE — Telephone Encounter (Signed)
-----   Message from Tsosie Billing, MD sent at 10/20/2022  9:18 PM EDT ----- Please let him the anal pap smear was normal with no abnormal findings- thx ----- Message ----- From: Interface, Lab In [place] Sent: 10/18/2022  10:50 AM EDT To: Tsosie Billing, MD

## 2022-10-24 NOTE — Telephone Encounter (Signed)
Patient responded via [employer], appointment set up see mychart note

## 2022-10-24 NOTE — Telephone Encounter (Signed)
-----   Message from Hollice Espy, MD sent at 10/24/2022  7:45 AM EDT ----- PSA is continuing to rise.  I am still not convinced at this point that we need to pursue biopsy but certainly the trend is concerning.  I think we should wait another 3 months and get a PSA.  If it is up at that time again, then we need to move forward with biopsy versus prostate MRI.  Hollice Espy, MD

## 2022-10-24 NOTE — Telephone Encounter (Signed)
Left message to call back  

## 2022-11-19 IMAGING — MR MR LUMBAR SPINE W/O CM
5 series · 31 of 48 positions shown · non-contrast
Comparison: Lumbar spine MRI 05/15/2019

CLINICAL DATA: Low back pain

EXAM:
MRI LUMBAR SPINE WITHOUT CONTRAST
TECHNIQUE: Multiplanar, multisequence MR imaging of the lumbar spine was
performed. No intravenous contrast was administered.

[Series 14: T2 · sagittal · 4.0mm · 0.81mm/px · 6 of 17 slices shown (1 of 2)]
[im 1/17]
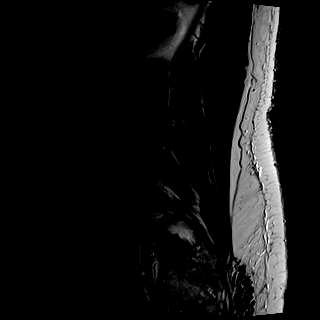
[im 4/17]
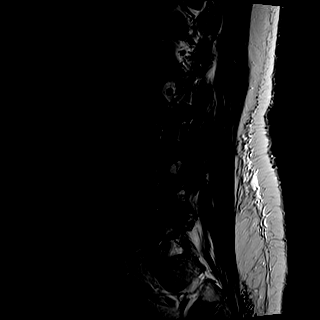
[im 7/17]
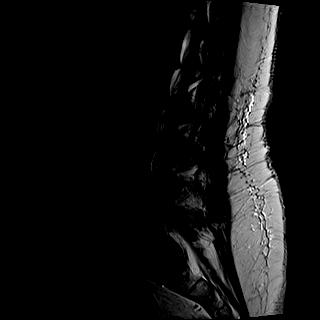
[im 10/17]
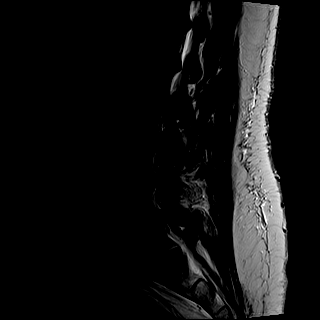
[im 13/17]
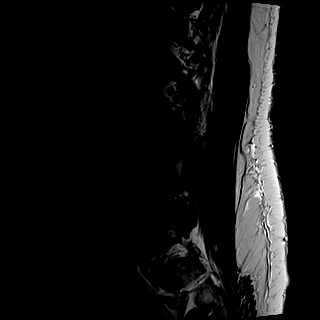
[im 17/17]
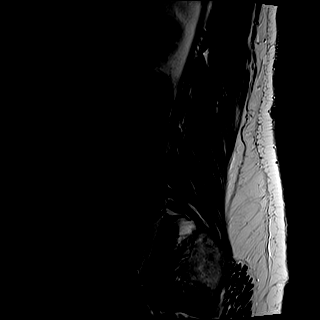

[Series 15: T1 · sagittal · 4.0mm · 0.81mm/px · 6 of 17 slices shown (1 of 2)]
[im 1/17]
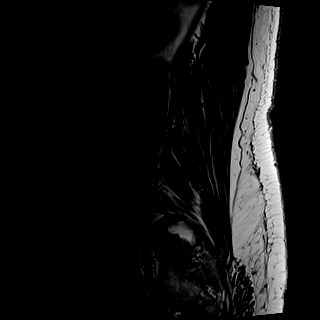
[im 4/17]
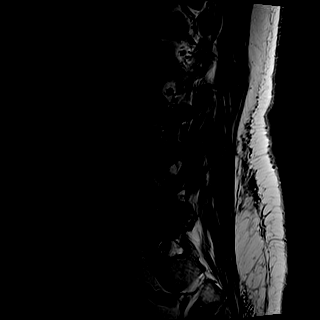
[im 7/17]
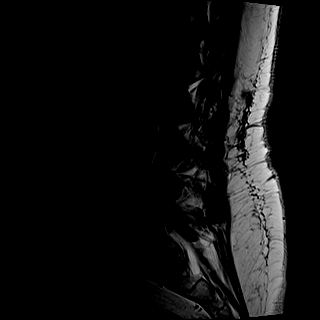
[im 10/17]
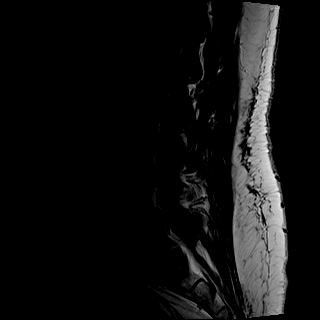
[im 13/17]
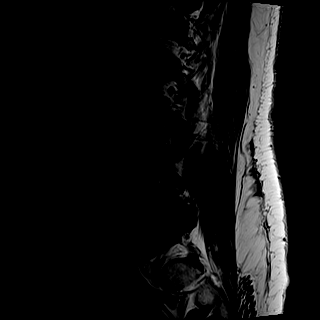
[im 17/17]
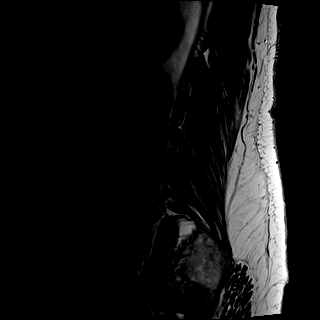

[Series 16: STIR · sagittal · 4.0mm · 0.41mm/px · 1 of 17 slices shown]
[im 1/17]
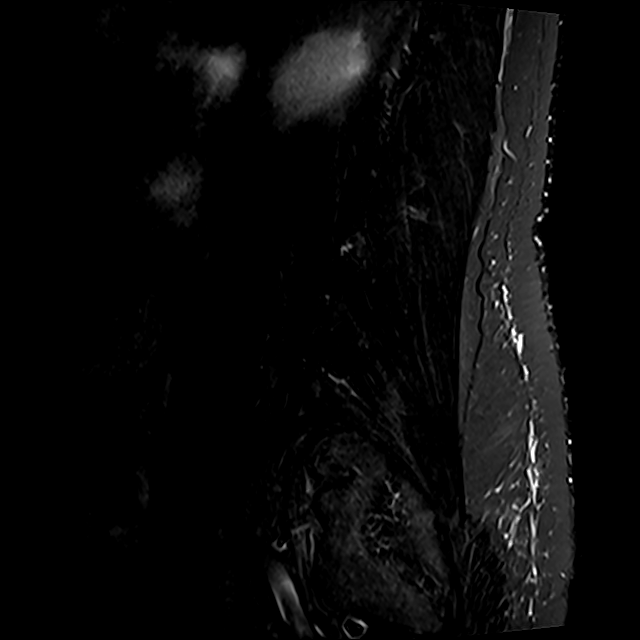

[Series 17: T2 · axial · 4.0mm · 0.78mm/px · z∈[-585,-348]mm · 9 of 39 slices shown (2 of 2)]
[im 1/39]
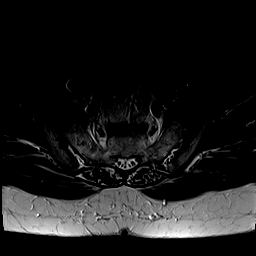
[im 6/39]
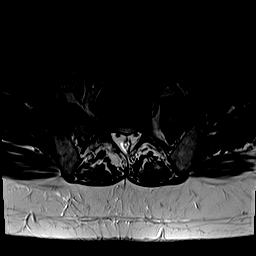
[im 11/39]
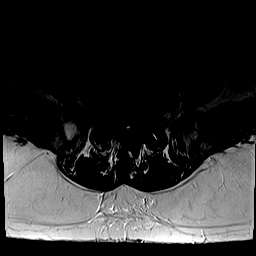
[im 17/39]
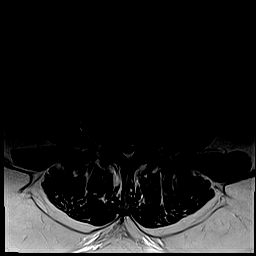
[im 20/39]
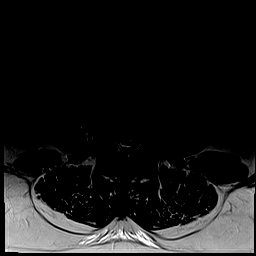
[im 22/39]
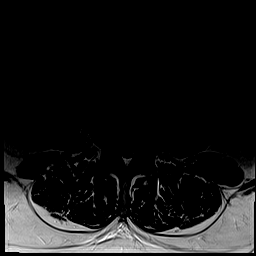
[im 28/39]
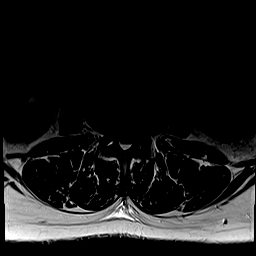
[im 33/39]
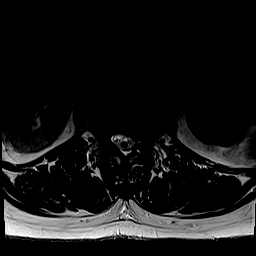
[im 39/39]
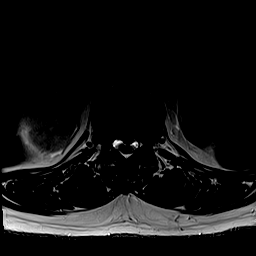

[Series 18: T1 · axial · 4.0mm · 0.39mm/px · z∈[-585,-348]mm · 9 of 39 slices shown (2 of 2)]
[im 1/39]
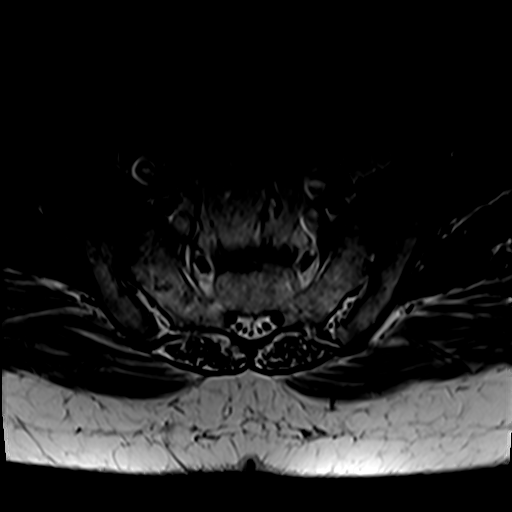
[im 6/39]
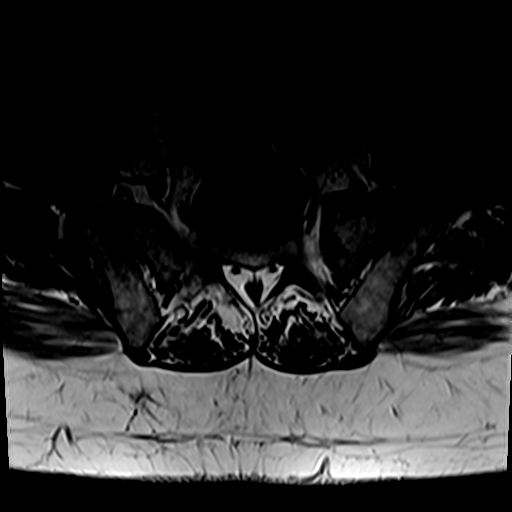
[im 11/39]
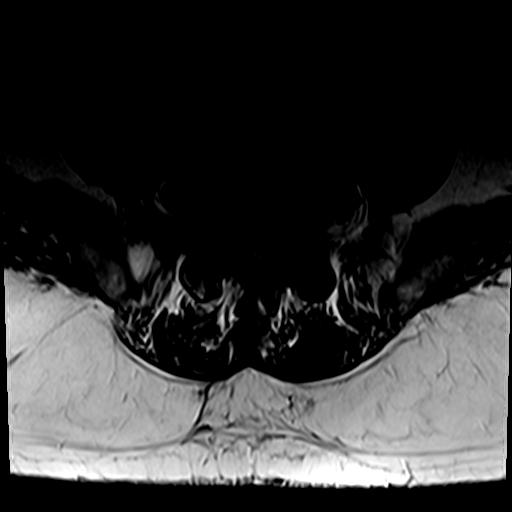
[im 17/39]
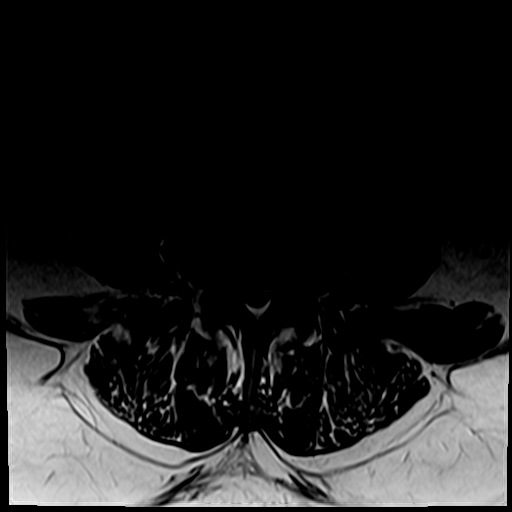
[im 20/39]
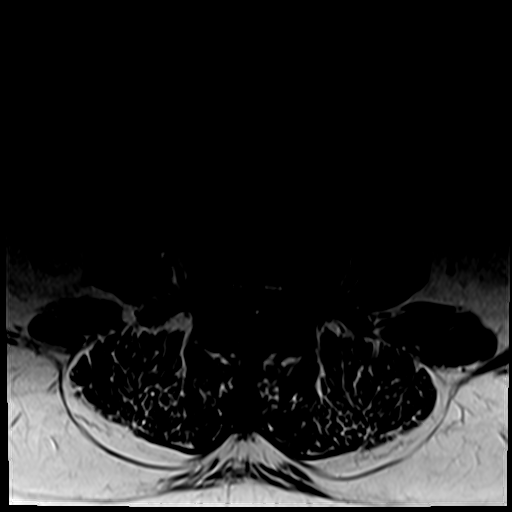
[im 22/39]
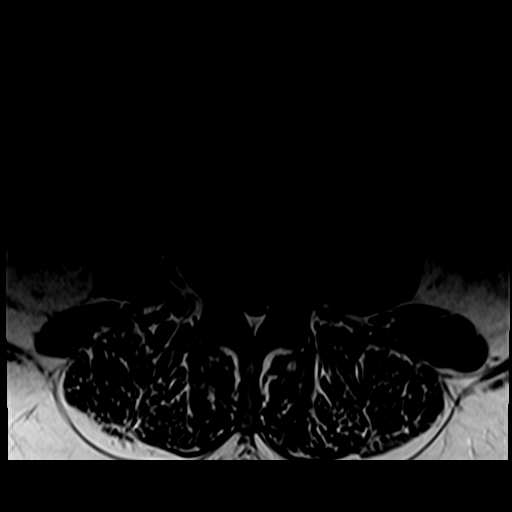
[im 28/39]
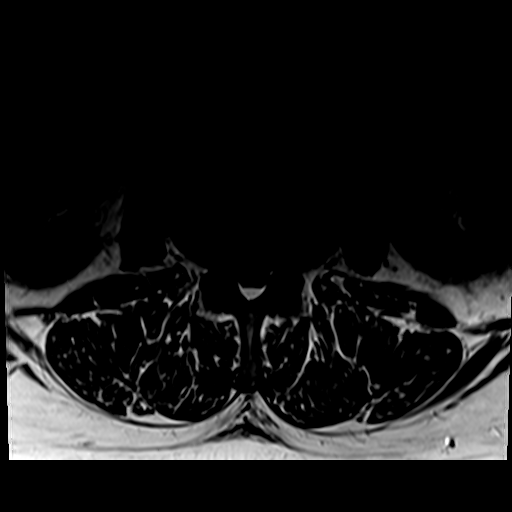
[im 33/39]
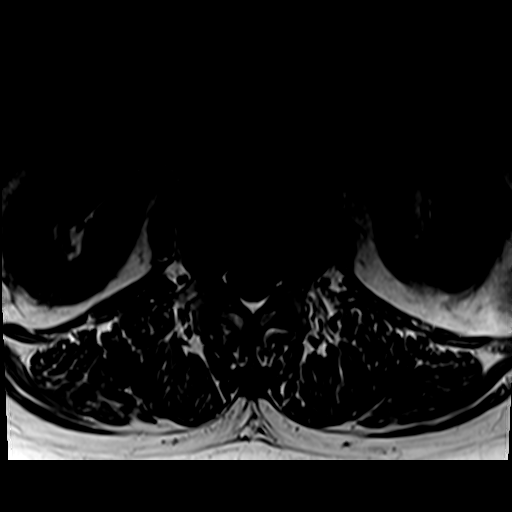
[im 39/39]
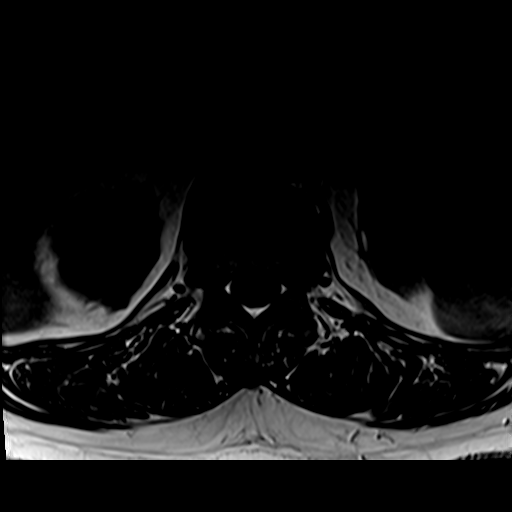

[31 of 48 positions shown; findings below may reference images not displayed]

FINDINGS: Segmentation:  Standard.  Rudimentary disc space at S1-2

Alignment: Grade 1 retrolisthesis at L1-2, L2-3 and L3-4. Grade 1
anterolisthesis at L5-S1.

Vertebrae:  No fracture, evidence of discitis, or bone lesion.

Conus medullaris and cauda equina: Conus extends to the L2 level.
Buckling of the cauda equina above the L2-3 level.

Paraspinal and other soft tissues: Negative.

Disc levels:

T12-L1: Large central disc extrusion with inferior migration to the
infrapedicle level. Mild spinal canal stenosis. No neural foraminal
stenosis.

L1-L2: New, small left subarticular disc protrusion narrowing the
left lateral recess. No central spinal canal stenosis. No neural
foraminal stenosis.

L2-L3: Diffuse disc bulge. Moderate spinal canal stenosis. No neural
foraminal stenosis.

L3-L4: Left asymmetric disc bulge. Unchanged severe spinal canal
stenosis. Unchanged moderate bilateral neural foraminal stenosis.

L4-L5: Disc bulge with endplate spurring. Unchanged severe spinal
canal stenosis. Unchanged severe bilateral neural foraminal
stenosis.

L5-S1: Severe facet arthrosis with intermediate disc bulge.
Unchanged severe spinal canal stenosis. Unchanged severe right and
moderate left neural foraminal stenosis.

Visualized sacrum: Normal.
IMPRESSION: 1. New, small left subarticular disc protrusion at L1-L2 narrowing
the left lateral recess. Correlate for left L2 radiculopathy.
2. Unchanged severe spinal canal stenosis at L3-4, L4-5 and L5-S1
with moderate spinal canal stenosis at L2-3.
3. Unchanged severe bilateral L4-5 and right and moderate left L5-S1
neural foraminal stenosis.

## 2022-12-03 ENCOUNTER — Encounter: Payer: Self-pay | Admitting: Family Medicine

## 2022-12-27 ENCOUNTER — Other Ambulatory Visit: Payer: Self-pay | Admitting: Family Medicine

## 2022-12-27 DIAGNOSIS — G2581 Restless legs syndrome: Secondary | ICD-10-CM

## 2022-12-27 NOTE — Telephone Encounter (Signed)
Requested Prescriptions  Pending Prescriptions Disp Refills   rOPINIRole (REQUIP) 1 MG tablet [Pharmacy Med Name: rOPINIRole HCl 1 MG Oral Tablet] 90 tablet 0    Sig: TAKE 1 TABLET BY MOUTH AT BEDTIME     Neurology:  Parkinsonian Agents Passed - 12/27/2022 11:41 AM      Passed - Last BP in normal range    BP Readings from Last 1 Encounters:  10/18/22 117/77         Passed - Last Heart Rate in normal range    Pulse Readings from Last 1 Encounters:  10/18/22 88         Passed - Valid encounter within last 12 months    Recent Outpatient Visits           2 months ago Obesity (BMI 30.0-34.9)   [facility] [place], Netta Neat, DO   5 months ago Elevated PSA, less than 10 ng/ml   [facility] Smitty Cords, DO   12 months ago Annual physical exam   [facility] Smitty Cords, DO   1 year ago Major depressive disorder, recurrent, in partial remission [facility])   [facility] Smitty Cords, DO   1 year ago Plantar fasciitis, right   [facility] Althea Charon, Netta Neat, DO       Future Appointments             In 1 week Althea Charon, Netta Neat, DO [facility] [place]   In 3 months Deirdre Evener, MD [facility]   In 3 months Lynn Ito, MD [facility]

## 2022-12-29 IMAGING — RF DG LUMBAR SPINE 2-3V
1 series · 4 of 4 positions shown · non-contrast
Comparison: Lumbar spine MRI 01/07/2021.

CLINICAL DATA: Provided history: Surgery, elective. Additional
history provided: Opening shots for lumbar 2-S1 laminectomies.
Provided fluoroscopy time 11 seconds (a 0.79 mGy).

EXAM:
LUMBAR SPINE - 2-3 VIEW; DG C-ARM 1-60 MIN

[Series 1: unknown protocol · 0.14mm/px · 4 of 4 slices shown]
[im 1/4]
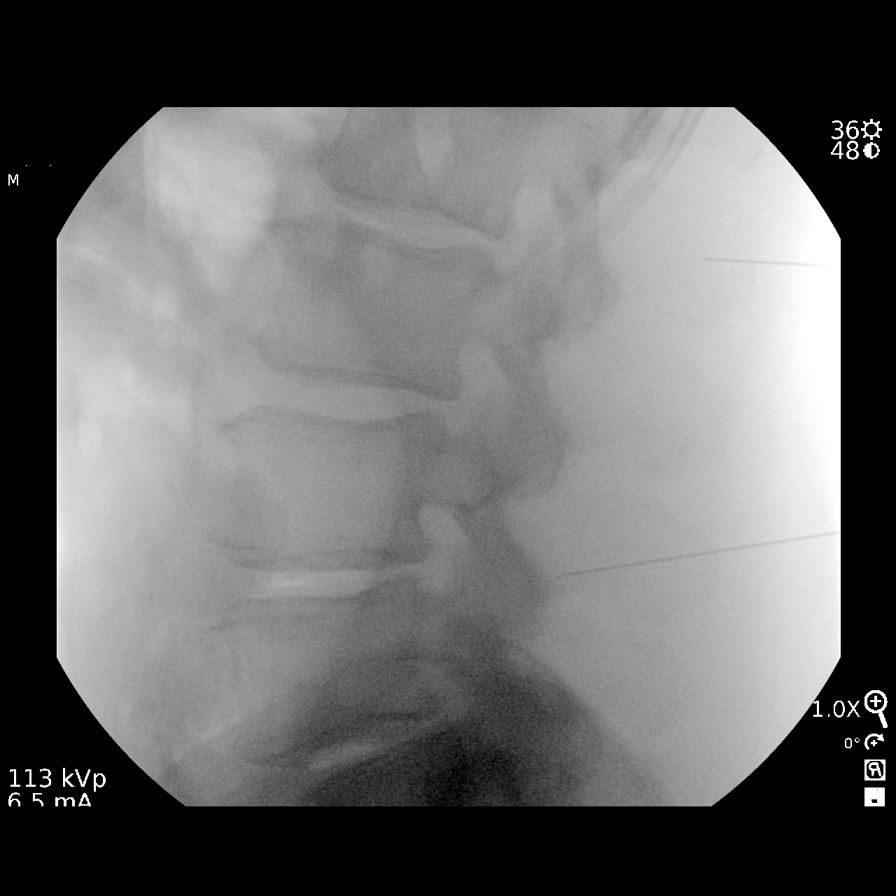
[im 2/4]
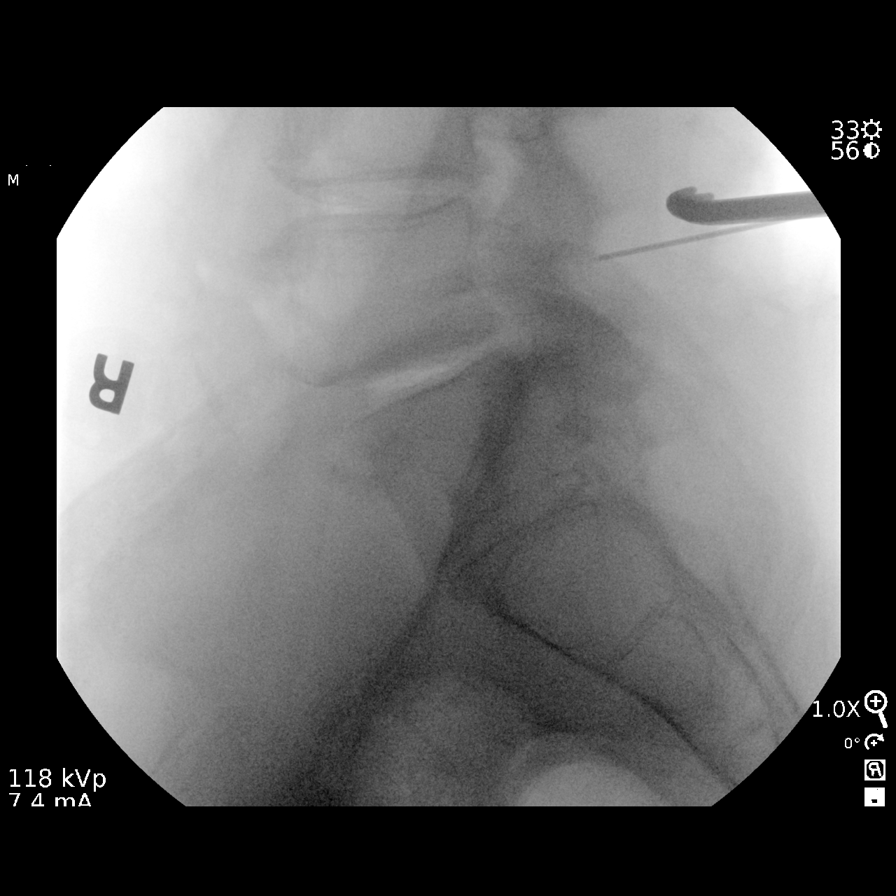
[im 3/4]
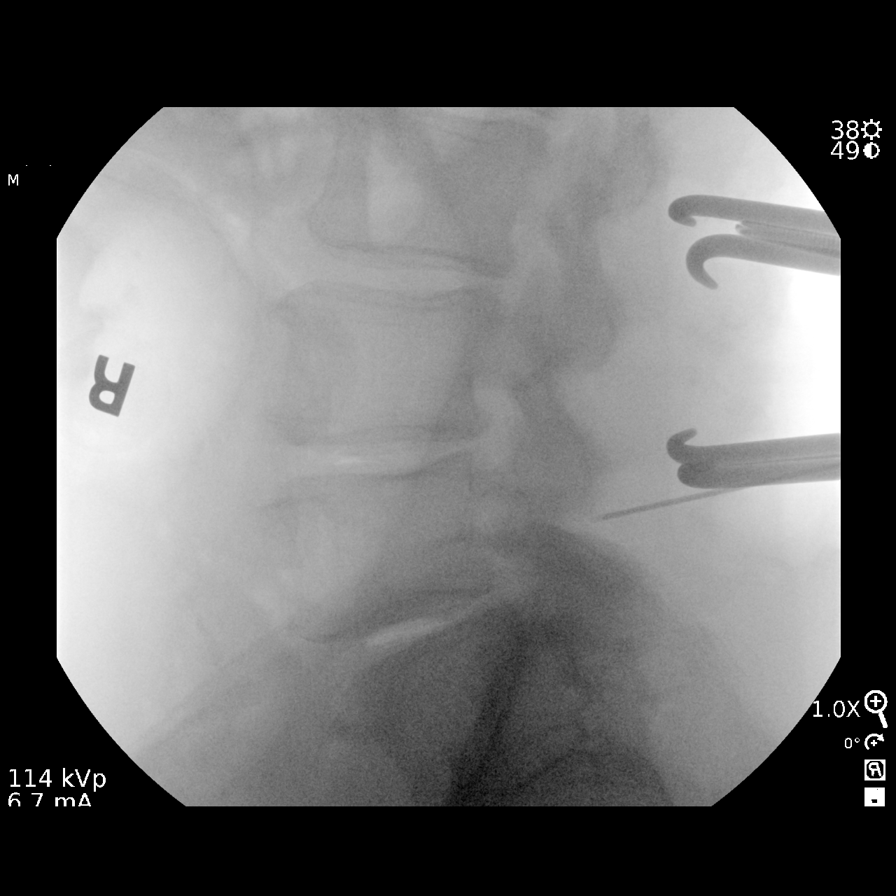
[im 4/4]
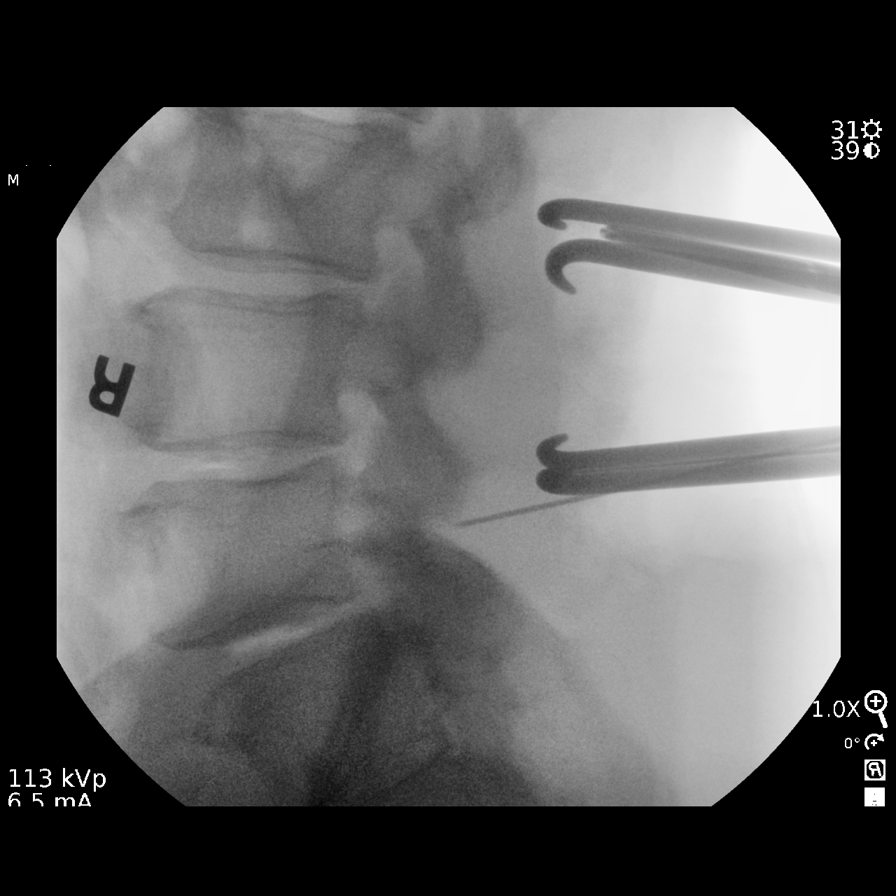

[4 of 4 positions shown; findings below may reference images not displayed]

FINDINGS: Four intraoperative lateral view fluoroscopic images of the
lumbosacral spine are submitted. The lowest well-formed
intervertebral disc space is designated L5-S1. A rudimentary S1-S2
interspace is noted on the prior lumbar spine MRI of 01/07/2021. On
the initial provided image, a metallic site markers project
posterior to the lumbar spine at the L1-L2 and L3-L4 levels. On the
subsequent radiograph acquired at [DATE] a.m., surgical instruments
project posterior to the lumbar spine at the L5-S1 level. On the
subsequent radiograph acquired at [DATE] a.m., surgical instruments
project posterior to the lumbar spine at the L3-L4 and L5-S1 levels.
On the subsequent radiograph acquired at [DATE] a.m., surgical
instruments project posterior to the lumbar spine at the L3-L4 and
L5-S1 levels.
IMPRESSION: Four intraoperative lateral view fluoroscopic images of the
lumbosacral spine for localization purposes, as described.

## 2023-01-04 ENCOUNTER — Encounter: Payer: Self-pay | Admitting: Family Medicine

## 2023-01-04 ENCOUNTER — Ambulatory Visit (INDEPENDENT_AMBULATORY_CARE_PROVIDER_SITE_OTHER): Payer: 59 | Admitting: Family Medicine

## 2023-01-04 VITALS — BP 104/68 | HR 101 | Ht 68.0 in | Wt 225.0 lb

## 2023-01-04 DIAGNOSIS — F17200 Nicotine dependence, unspecified, uncomplicated: Secondary | ICD-10-CM

## 2023-01-04 DIAGNOSIS — Z0000 Encounter for general adult medical examination without abnormal findings: Secondary | ICD-10-CM | POA: Diagnosis not present

## 2023-01-04 DIAGNOSIS — R7309 Other abnormal glucose: Secondary | ICD-10-CM

## 2023-01-04 DIAGNOSIS — M48061 Spinal stenosis, lumbar region without neurogenic claudication: Secondary | ICD-10-CM

## 2023-01-04 DIAGNOSIS — B20 Human immunodeficiency virus [HIV] disease: Secondary | ICD-10-CM

## 2023-01-04 DIAGNOSIS — R252 Cramp and spasm: Secondary | ICD-10-CM

## 2023-01-04 DIAGNOSIS — N521 Erectile dysfunction due to diseases classified elsewhere: Secondary | ICD-10-CM

## 2023-01-04 DIAGNOSIS — E66811 Obesity, class 1: Secondary | ICD-10-CM

## 2023-01-04 DIAGNOSIS — Z122 Encounter for screening for malignant neoplasm of respiratory organs: Secondary | ICD-10-CM | POA: Diagnosis not present

## 2023-01-04 DIAGNOSIS — F411 Generalized anxiety disorder: Secondary | ICD-10-CM | POA: Diagnosis not present

## 2023-01-04 DIAGNOSIS — F3341 Major depressive disorder, recurrent, in partial remission: Secondary | ICD-10-CM

## 2023-01-04 DIAGNOSIS — I10 Essential (primary) hypertension: Secondary | ICD-10-CM

## 2023-01-04 DIAGNOSIS — E669 Obesity, unspecified: Secondary | ICD-10-CM

## 2023-01-04 DIAGNOSIS — E782 Mixed hyperlipidemia: Secondary | ICD-10-CM

## 2023-01-04 DIAGNOSIS — G629 Polyneuropathy, unspecified: Secondary | ICD-10-CM

## 2023-01-04 DIAGNOSIS — F1021 Alcohol dependence, in remission: Secondary | ICD-10-CM

## 2023-01-04 DIAGNOSIS — R972 Elevated prostate specific antigen [PSA]: Secondary | ICD-10-CM

## 2023-01-04 MED ORDER — NICOTINE 21 MG/24HR TD PT24: 21.0000 mg | MEDICATED_PATCH | Freq: Every day | TRANSDERMAL | 1 refills | Status: DC | PRN

## 2023-01-04 MED ORDER — TADALAFIL 20 MG PO TABS: 20.0000 mg | ORAL_TABLET | ORAL | 5 refills | Status: DC | PRN

## 2023-01-04 MED ORDER — DULOXETINE HCL 60 MG PO CPEP: 60.0000 mg | ORAL_CAPSULE | Freq: Every day | ORAL | 5 refills | Status: DC

## 2023-01-04 NOTE — Assessment & Plan Note (Signed)
Stable On med management SNRI

## 2023-01-04 NOTE — Assessment & Plan Note (Signed)
Alcohol free in remission

## 2023-01-04 NOTE — Patient Instructions (Addendum)
Thank you for coming to the office today.  Keep reducing Gabapentin from half tablet 600 for 300mg  dose 3 times a day, down to twice a day then once a day over course of 1 week per dose change. May keep around for as needed in the future.  Dose increase Duloxetine (cymbalta) 30mg  up to 60mg , new order sent.  ------------------------  Labs today, stay tuned for results.  Dr Apolinar Junes will check PSA 6/18  Dr Rivka Safer will check the other lab work for HIV testing in September 2024.  Referral to Lung Cancer Screening Program. Low Dose CT Scan Chest, they will call you to schedule.  [facility] [address] [place], [place] [address] Phone: [phone]  These offices have both PSYCHIATRY doctors and THERAPISTS  [facility] (Virtual Available) [place] [address] [place], [place] [address] Phone: [phone]  [facility] Address: [address], [place], [place] [address] [email or web address] Phone: [phone]   [facility]) Address: [address], [place], [place] [address] Hours: 8:30AM-5PM Phone: [phone]  [facility] (Adult, Peds, Geriatric, Counseling) [address] [place], [place] [address] Phone: [phone] Fax: [phone]  [facility] [place] [facility] [address] [place], [place] [address] Phone: [phone]  [facility] (All ages) [address] [place]vin Knack [place], [id number] Phone: [phone] (Option 1) [email or web address]  ----------------------------------------------------------------- THERAPIST ONLY  (No Psychiatry)  [facility] [address] [place], [place] [address] [place] P: [phone]  [facility]) [facility], [facility] [address] [place], [place] [address] [phone]  [facility]   Address: [address] [place], [place] [address] Hours: Open today  9AM-7PM Phone: [phone]  [facility] [address], [facility]  - [facility] Address: [address] [place], [place] [address] Phone: [phone]  [facility] [place], [place] [address] Phone: [phone]    Please schedule a Follow-up Appointment to: Return in about 6 months (around 07/07/2023) for 6 month follow-up HTN, updates from specialists, Neuropathy.  If you have any other questions or concerns, please feel free to call the office or send a message through MyChart. You may also schedule an earlier appointment if necessary.  Additionally, you may be receiving a survey about your experience at our office within a few days to 1 week by e-mail or mail. We value your feedback.  Saralyn Pilar, DO [facility], [place]

## 2023-01-04 NOTE — Assessment & Plan Note (Signed)
Re order Tadalafil from 10 to 20mg  every other day, goodrx coupon

## 2023-01-04 NOTE — Assessment & Plan Note (Signed)
Controlled cholesterol off statin and lifestyle Last lipid panel 12/2021 LDL nearly 100 The 10-year ASCVD risk score (Arnett DK, et al., 2019) is: 11.1%  Plan: 1. Off Atorvastatin 20mg , discussed future reconsider low dose for ASCVD risk only 2. Encourage improved lifestyle - low carb/cholesterol, reduce portion size, continue improving regular exercise

## 2023-01-04 NOTE — Assessment & Plan Note (Signed)
Stable chronic neuropathy Reducing Gabapentin, doing well on lowe dose. Eventually taper off Continue on SNRI Duloxetine

## 2023-01-04 NOTE — Assessment & Plan Note (Signed)
Well-controlled HTN - Home BP readings    No known complications    Plan:  1.  Continue current BP regimen Lisinopril 40mg daily and HCTZ 25mg daily 2. Encourage improved lifestyle - low sodium diet, regular exercise 3. Continue monitor BP outside office, bring readings to next visit, if persistently >140/90 or new symptoms notify office sooner  

## 2023-01-04 NOTE — Progress Notes (Signed)
Subjective:    Patient ID: Daryl Shelton, male    DOB: 12-Jan-1961, 62 y.o.   MRN: [id number]  Daryl Shelton is a 62 y.o. male presenting on 01/04/2023 for Annual Exam   HPI  Here for Annual Physical and Lab Orders.  Elevated PSA Past history with [facility] lab PSA 0.90 (05/2017) Trend increase 2.35 (01/2022) then increase to 3.03 (06/2022) with velocity of 0.75 inc within 1 year He does not endorse any BPH or LUTS at this time. Urinary habits mostly normal. He has never been on med for it. No fam history prostate CA   Followed by [facility] Dr Apolinar Junes Elevated PSA: Upcoming PSA lab 01/24/23 ordered by Dr Apolinar Junes - Prior range elevation 2.3 to 3 to 3.9  CHRONIC HTN: Reports no concerns, needs re order today Current Meds - Lisinopril 40mg  daily, HCTZ 25mg  daily   Reports good compliance, took meds today. Tolerating well, w/o complaints. Denies CP, dyspnea, HA, edema, dizziness / lightheadedness   Obesity BMI >34 Previously trial on Contrave, since GLP1 not covered. He did trial of Saxenda but not available. Weight gain still after treatment on Contrave Also tried Phentermine 37.5mg  without significant appetite suppression  Failed regular diet exercise regimen  Lumbar DDD S/p Spinal surgery Lumbar Laminectomy 02/16/21 Dr Dawley He had complications with numbness from waist down, has improved. - He also has some cervical spine DDD   Alcohol Abuse history Neuropathy + RLS Abstained now from alcohol since 09/22/20, he was drinking daily for 30 years Prior history  R worse than Left. Both burning and also has RLS. 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 taking Ropinirole earlier in mid afternoon, current dose 1mg  daily works well. Instead of taking at bedtime.  Reduced Gabapentin from 900 THREE TIMES A DAY down to 300mg  THREE TIMES A DAY, he has been reducing and breaking tablet. He would like to taper off, he has lost some weight off Gabapentin.  ED On 10mg   Cialis AS NEEDED generic. Asking about dose increase.   Major Depression recurrent Generalized Anxiety Hx Slight Agoraphobia History of childhood trauma, contributed  Off Sertraline, Remeron On mild mood anxiety He is interested in Therapist, he will check into this online   HIV, chronic, controlled and undetectable Followed by Dr Rivka Safer ([facility]) On Biktarvy Next labs 04/2023, his past panel 10/2022 showed some slightly higher HIV levels    HYPERLIPIDEMIA: - Reports no concerns. Last lipid panel 12/2022, normal, without statin > 9 months was on atorvastatin 20mg  Not on therapy Lifestyle - Diet: improved plant based diet     Health Maintenance:  Cologuard 01/14/22 negative, repeat 3 years, 01/2025  Shingles vaccine UTD      01/04/2023    9:22 AM 10/18/2022    9:01 AM 10/06/2022   10:02 AM  Depression screen PHQ 2/9  Decreased Interest 0 0 0  Down, Depressed, Hopeless 1 1 1   PHQ - 2 Score 1 1 1   Altered sleeping  0 0  Tired, decreased energy  0 1  Change in appetite  0 1  Feeling bad or failure about yourself   0 0  Trouble concentrating  0 0  Moving slowly or fidgety/restless  0 0  Suicidal thoughts  0 0  PHQ-9 Score  1 3  Difficult doing work/chores  Not difficult at all Not difficult at all      01/04/2023    9:22 AM 10/06/2022   10:03 AM 12/31/2021  2:49 PM 05/28/2021    3:37 PM  GAD 7 : Generalized Anxiety Score  Nervous, Anxious, on Edge 1 1 1 1   Control/stop worrying 1 0 0 1  Worry too much - different things 1 0 1 1  Trouble relaxing 1 1 0 0  Restless 0 0 0 0  Easily annoyed or irritable 1 1 0 1  Afraid - awful might happen 1 1 1 1   Total GAD 7 Score 6 4 3 5   Anxiety Difficulty  Not difficult at all Not difficult at all Not difficult at all      Past Medical History:  Diagnosis Date   Alcoholism (HCC)    Anxiety    Arthritis    Bell's palsy    Depression    HIV (human immunodeficiency virus infection) (HCC)    Hypertension    Past  Surgical History:  Procedure Laterality Date   LUMBAR LAMINECTOMY/DECOMPRESSION MICRODISCECTOMY N/A 02/16/2021   Procedure: OPEN LUMBAR LAMINECTOMY, LUMBAR TWO-THREE, LUMBAR THREE-FOUR, LUMBAR FOUR-FIVE,LUMBAR FIVE-SACRAL ONE;  Surgeon: Bethann Goo, DO;  Location: [facility] OR;  Service: Neurosurgery;  Laterality: N/A;   Social History   Socioeconomic History   Marital status: Married    Spouse name: Not on file   Number of children: Not on file   Years of education: Not on file   Highest education level: Not on file  Occupational History   Not on file  Tobacco Use   Smoking status: Every Day    Packs/day: 0.50    Years: 40.00    Additional pack years: 0.00    Total pack years: 20.00    Types: Cigarettes   Smokeless tobacco: Never  Vaping Use   Vaping Use: Never used  Substance and Sexual Activity   Alcohol use: Not Currently   Drug use: No   Sexual activity: Yes  Other Topics Concern   Not on file  Social History Narrative   Not on file   Social Determinants of Health   Financial Resource Strain: Not on file  Food Insecurity: Not on file  Transportation Needs: Not on file  Physical Activity: Not on file  Stress: Not on file  Social Connections: Not on file  Intimate Partner Violence: Not on file   Family History  Problem Relation Age of Onset   CAD Father    Stomach cancer Sister    Prostate cancer Neg Hx    Current Outpatient Medications on File Prior to Visit  Medication Sig   bictegravir-emtricitabine-tenofovir AF (BIKTARVY) 50-200-25 MG TABS tablet Take 1 tablet by mouth daily.   diphenhydrAMINE (BENADRYL) 25 MG tablet Take 25 mg by mouth daily at 6 (six) AM.   hydrochlorothiazide (HYDRODIURIL) 25 MG tablet Take 1 tablet (25 mg total) by mouth daily.   lisinopril (ZESTRIL) 40 MG tablet Take 1 tablet (40 mg total) by mouth daily.   Multiple Vitamin (MULTIVITAMIN) tablet Take 1 tablet by mouth daily.   Multiple Vitamins-Minerals (MULTIVITAMIN ADULTS 50+) TABS  Take 1 tablet by mouth daily.   nicotine (NICODERM CQ - DOSED IN MG/24 HOURS) 21 mg/24hr patch Place 1 patch (21 mg total) onto the skin daily as needed (nicontine dependence).   omeprazole (PRILOSEC) 20 MG capsule Take 20 mg by mouth daily.   rOPINIRole (REQUIP) 1 MG tablet TAKE 1 TABLET BY MOUTH AT BEDTIME   gabapentin (NEURONTIN) 600 MG tablet Take 0.5 tablets (300 mg total) by mouth 3 (three) times daily.   No current facility-administered medications on file prior  to visit.    Review of Systems  Constitutional:  Negative for activity change, appetite change, chills, diaphoresis, fatigue and fever.  HENT:  Negative for congestion and hearing loss.   Eyes:  Negative for visual disturbance.  Respiratory:  Negative for cough, chest tightness, shortness of breath and wheezing.   Cardiovascular:  Negative for chest pain, palpitations and leg swelling.  Gastrointestinal:  Negative for abdominal pain, constipation, diarrhea, nausea and vomiting.  Genitourinary:  Negative for dysuria, frequency and hematuria.  Musculoskeletal:  Negative for arthralgias and neck pain.  Skin:  Negative for rash.  Neurological:  Negative for dizziness, weakness, light-headedness, numbness and headaches.  Hematological:  Negative for adenopathy.  Psychiatric/Behavioral:  Negative for behavioral problems, dysphoric mood and sleep disturbance.    Per HPI unless specifically indicated above      Objective:    BP 104/68   Pulse (!) 101   Ht 5\' 8"  (1.727 m)   Wt 225 lb (102.1 kg)   SpO2 97%   BMI 34.21 kg/m   Wt Readings from Last 3 Encounters:  01/04/23 225 lb (102.1 kg)  10/18/22 233 lb (105.7 kg)  10/06/22 230 lb (104.3 kg)    Physical Exam Vitals and nursing note reviewed.  Constitutional:      General: He is not in acute distress.    Appearance: He is well-developed. He is not diaphoretic.     Comments: Well-appearing, comfortable, cooperative  HENT:     Head: Normocephalic and atraumatic.   Eyes:     General:        Right eye: No discharge.        Left eye: No discharge.     Conjunctiva/sclera: Conjunctivae normal.     Pupils: Pupils are equal, round, and reactive to light.  Neck:     Thyroid: No thyromegaly.     Vascular: No carotid bruit.  Cardiovascular:     Rate and Rhythm: Normal rate and regular rhythm.     Pulses: Normal pulses.     Heart sounds: Normal heart sounds. No murmur heard. Pulmonary:     Effort: Pulmonary effort is normal. No respiratory distress.     Breath sounds: Normal breath sounds. No wheezing or rales.  Abdominal:     General: Bowel sounds are normal. There is no distension.     Palpations: Abdomen is soft. There is no mass.     Tenderness: There is no abdominal tenderness.  Musculoskeletal:        General: No tenderness. Normal range of motion.     Cervical back: Normal range of motion and neck supple.     Right lower leg: No edema.     Left lower leg: No edema.     Comments: Upper / Lower Extremities: - Normal muscle tone, strength bilateral upper extremities 5/5, lower extremities 5/5  Lymphadenopathy:     Cervical: No cervical adenopathy.  Skin:    General: Skin is warm and dry.     Findings: No erythema or rash.  Neurological:     Mental Status: He is alert and oriented to person, place, and time.     Comments: Distal sensation intact to light touch all extremities  Psychiatric:        Mood and Affect: Mood normal.        Behavior: Behavior normal.        Thought Content: Thought content normal.     Comments: Well groomed, good eye contact, normal speech and thoughts  Results for orders placed or performed in visit on 10/20/22  PSA  Result Value Ref Range   Prostate Specific Ag, Serum 3.7 0.0 - 4.0 ng/mL      Assessment & Plan:   Problem List Items Addressed This Visit     Alcohol dependence in remission (HCC)    Alcohol free in remission      Currently asymptomatic HIV infection, with history of HIV-related  illness (HCC)    Followed by ID specialist Dr Rivka Safer Last labs 10/2022 elevated level On Biktarvy Next apt w/ labs 04/2023      Elevated PSA, less than 10 ng/ml    Followed by [facility] Dr Apolinar Junes, next repeat lab 6/18      Erectile dysfunction due to diseases classified elsewhere    Re order Tadalafil from 10 to 20mg  every other day, goodrx coupon      Relevant Medications   tadalafil (CIALIS) 20 MG tablet   Essential hypertension    Well-controlled HTN - Home BP readings    No known complications    Plan:  1.  Continue current BP regimen Lisinopril 40mg  daily and HCTZ 25mg  daily 2. Encourage improved lifestyle - low sodium diet, regular exercise 3. Continue monitor BP outside office, bring readings to next visit, if persistently >140/90 or new symptoms notify office sooner       Relevant Medications   tadalafil (CIALIS) 20 MG tablet   Other Relevant Orders   COMPLETE METABOLIC PANEL WITH GFR   CBC with Differential/Platelet   GAD (generalized anxiety disorder)    Stable On med management SNRI      Relevant Medications   DULoxetine (CYMBALTA) 60 MG capsule   Hyperlipidemia, mixed    Controlled cholesterol off statin and lifestyle Last lipid panel 12/2021 LDL nearly 100 The 10-year ASCVD risk score (Arnett DK, et al., 2019) is: 11.1%  Plan: 1. Off Atorvastatin 20mg , discussed future reconsider low dose for ASCVD risk only 2. Encourage improved lifestyle - low carb/cholesterol, reduce portion size, continue improving regular exercise       Relevant Medications   tadalafil (CIALIS) 20 MG tablet   Other Relevant Orders   COMPLETE METABOLIC PANEL WITH GFR   Lipid panel   TSH   Major depressive disorder, recurrent, in partial remission (HCC)    Currently stable Partial remission Off Mirtazapine Sertraline On Duloxetine - will dose increase from 30 to 60mg  daily now May link up with therapist again      Relevant Medications   DULoxetine (CYMBALTA) 60 MG  capsule   Neuropathy    Stable chronic neuropathy Reducing Gabapentin, doing well on lowe dose. Eventually taper off Continue on SNRI Duloxetine      Relevant Medications   DULoxetine (CYMBALTA) 60 MG capsule   Other Visit Diagnoses     Annual physical exam    -  Primary   Relevant Orders   COMPLETE METABOLIC PANEL WITH GFR   CBC with Differential/Platelet   Hemoglobin A1c   Lipid panel   TSH   Tobacco dependence       Relevant Orders   Ambulatory Referral Lung Cancer Screening [facility]   Screening for lung cancer       Relevant Orders   Ambulatory Referral Lung Cancer Screening [facility]   Spinal stenosis of lumbar region, unspecified whether neurogenic claudication present       Relevant Medications   DULoxetine (CYMBALTA) 60 MG capsule   gabapentin (NEURONTIN) 600 MG tablet   Obesity (BMI  30.0-34.9)       Abnormal glucose       Relevant Orders   Hemoglobin A1c   Muscle cramp       Relevant Orders   Magnesium       Updated Health Maintenance information Fasting labs ordered for today Encouraged improvement to lifestyle with diet and exercise Goal of weight loss  Meds ordered this encounter  Medications   DULoxetine (CYMBALTA) 60 MG capsule    Sig: Take 1 capsule (60 mg total) by mouth daily.    Dispense:  30 capsule    Refill:  5    Dose increase 30 to 60mg    tadalafil (CIALIS) 20 MG tablet    Sig: Take 1 tablet (20 mg total) by mouth every other day as needed for erectile dysfunction.    Dispense:  45 tablet    Refill:  5    goodrx      Follow up plan: Return in about 6 months (around 07/07/2023) for 6 month follow-up HTN, updates from specialists, Neuropathy.  Saralyn Pilar, DO [facility] 01/04/2023, 9:26 AM

## 2023-01-04 NOTE — Assessment & Plan Note (Signed)
Followed by [facility] Dr Apolinar Junes, next repeat lab 6/18

## 2023-01-04 NOTE — Addendum Note (Signed)
Addended by: Judd Gaudier on: 01/04/2023 03:58 PM   Modules accepted: Orders

## 2023-01-04 NOTE — Assessment & Plan Note (Signed)
Followed by ID specialist Dr Rivka Safer Last labs 10/2022 elevated level On Biktarvy Next apt w/ labs 04/2023

## 2023-01-04 NOTE — Assessment & Plan Note (Addendum)
Currently stable Partial remission Off Mirtazapine Sertraline On Duloxetine - will dose increase from 30 to 60mg  daily now May link up with therapist again

## 2023-01-05 LAB — CBC WITH DIFFERENTIAL/PLATELET
Absolute Monocytes: 402 cells/uL (ref 200–950)
Basophils Absolute: 42 cells/uL (ref 0–200)
Basophils Relative: 0.7 %
Eosinophils Absolute: 174 cells/uL (ref 15–500)
Eosinophils Relative: 2.9 %
HCT: 49.4 % (ref 38.5–50.0)
Hemoglobin: 16.1 g/dL (ref 13.2–17.1)
Lymphs Abs: 1752 cells/uL (ref 850–3900)
MCH: 31.4 pg (ref 27.0–33.0)
MCHC: 32.6 g/dL (ref 32.0–36.0)
MCV: 96.5 fL (ref 80.0–100.0)
MPV: 10.3 fL (ref 7.5–12.5)
Monocytes Relative: 6.7 %
Neutro Abs: 3630 cells/uL (ref 1500–7800)
Neutrophils Relative %: 60.5 %
Platelets: 197 10*3/uL (ref 140–400)
RBC: 5.12 10*6/uL (ref 4.20–5.80)
RDW: 12.5 % (ref 11.0–15.0)
Total Lymphocyte: 29.2 %
WBC: 6 10*3/uL (ref 3.8–10.8)

## 2023-01-05 LAB — COMPLETE METABOLIC PANEL WITH GFR
AG Ratio: 1.5 (calc) (ref 1.0–2.5)
ALT: 14 U/L (ref 9–46)
AST: 22 U/L (ref 10–35)
Albumin: 4.9 g/dL (ref 3.6–5.1)
Alkaline phosphatase (APISO): 67 U/L (ref 35–144)
BUN/Creatinine Ratio: 15 (calc) (ref 6–22)
BUN: 21 mg/dL (ref 7–25)
CO2: 30 mmol/L (ref 20–32)
Calcium: 10.2 mg/dL (ref 8.6–10.3)
Chloride: 99 mmol/L (ref 98–110)
Creat: 1.43 mg/dL — ABNORMAL HIGH (ref 0.70–1.35)
Globulin: 3.3 g/dL (calc) (ref 1.9–3.7)
Glucose, Bld: 102 mg/dL — ABNORMAL HIGH (ref 65–99)
Potassium: 5.1 mmol/L (ref 3.5–5.3)
Sodium: 140 mmol/L (ref 135–146)
Total Bilirubin: 0.7 mg/dL (ref 0.2–1.2)
Total Protein: 8.2 g/dL — ABNORMAL HIGH (ref 6.1–8.1)
eGFR: 55 mL/min/{1.73_m2} — ABNORMAL LOW (ref >=60)

## 2023-01-05 LAB — LIPID PANEL
Cholesterol: 249 mg/dL — ABNORMAL HIGH (ref <200)
HDL: 62 mg/dL (ref >=40)
LDL Cholesterol (Calc): 151 mg/dL (calc) — ABNORMAL HIGH
Non-HDL Cholesterol (Calc): 187 mg/dL (calc) — ABNORMAL HIGH (ref <130)
Total CHOL/HDL Ratio: 4 (calc) (ref <5.0)
Triglycerides: 225 mg/dL — ABNORMAL HIGH (ref <150)

## 2023-01-05 LAB — HEMOGLOBIN A1C
Hgb A1c MFr Bld: 5.5 % of total Hgb (ref <5.7)
Mean Plasma Glucose: 111 mg/dL
eAG (mmol/L): 6.2 mmol/L

## 2023-01-05 LAB — MAGNESIUM: Magnesium: 2.1 mg/dL (ref 1.5–2.5)

## 2023-01-05 LAB — TSH: TSH: 0.63 mIU/L (ref 0.40–4.50)

## 2023-01-06 ENCOUNTER — Encounter: Payer: Self-pay | Admitting: Family Medicine

## 2023-01-24 ENCOUNTER — Encounter: Payer: Self-pay | Admitting: Urology

## 2023-01-24 ENCOUNTER — Other Ambulatory Visit: Payer: 59

## 2023-02-22 ENCOUNTER — Other Ambulatory Visit: Payer: Self-pay | Admitting: Emergency Medicine

## 2023-02-22 DIAGNOSIS — Z122 Encounter for screening for malignant neoplasm of respiratory organs: Secondary | ICD-10-CM

## 2023-02-22 DIAGNOSIS — Z87891 Personal history of nicotine dependence: Secondary | ICD-10-CM

## 2023-02-22 DIAGNOSIS — F17210 Nicotine dependence, cigarettes, uncomplicated: Secondary | ICD-10-CM

## 2023-02-28 ENCOUNTER — Other Ambulatory Visit: Payer: Medicare Other

## 2023-02-28 DIAGNOSIS — R972 Elevated prostate specific antigen [PSA]: Secondary | ICD-10-CM

## 2023-03-01 ENCOUNTER — Encounter: Payer: Self-pay | Admitting: Urology

## 2023-03-01 DIAGNOSIS — R972 Elevated prostate specific antigen [PSA]: Secondary | ICD-10-CM

## 2023-03-01 LAB — PSA: Prostate Specific Ag, Serum: 3.8 ng/mL (ref 0.0–4.0)

## 2023-03-15 ENCOUNTER — Ambulatory Visit
Admission: RE | Admit: 2023-03-15 | Discharge: 2023-03-15 | Disposition: A | Discharge status: Home / Self Care | Payer: 59 | Source: Ambulatory Visit | Attending: Urology | Admitting: Urology

## 2023-03-15 DIAGNOSIS — R972 Elevated prostate specific antigen [PSA]: Secondary | ICD-10-CM | POA: Insufficient documentation

## 2023-03-15 MED ORDER — GADOBUTROL 1 MMOL/ML IV SOLN
9.0000 mL | Freq: Once | INTRAVENOUS | Status: AC | PRN
  Administered 2023-03-15: 9 mL via INTRAVENOUS

## 2023-03-22 ENCOUNTER — Ambulatory Visit (INDEPENDENT_AMBULATORY_CARE_PROVIDER_SITE_OTHER): Payer: 59 | Admitting: Urology

## 2023-03-22 VITALS — BP 119/79 | HR 89 | Ht 68.0 in | Wt 221.0 lb

## 2023-03-22 DIAGNOSIS — N503 Cyst of epididymis: Secondary | ICD-10-CM | POA: Diagnosis not present

## 2023-03-22 DIAGNOSIS — R972 Elevated prostate specific antigen [PSA]: Secondary | ICD-10-CM | POA: Diagnosis not present

## 2023-03-22 NOTE — Progress Notes (Signed)
Marcelle Overlie Plume,acting as a scribe for Vanna Scotland, MD.,have documented all relevant documentation on the behalf of Vanna Scotland, MD,as directed by  Vanna Scotland, MD while in the presence of Vanna Scotland, MD.  03/22/2023 3:10 PM   Daryl Shelton July 01, 1961 [id number]  Referring provider: Smitty Cords, DO [address] [place],  [place] [address]  Chief Complaint  Patient presents with   Results    HPI: 62 year-old male who returns today for follow up. He has a personal history of rising PSA. His prostate was notably enlarged on exam, but not irregular. Prostate volume is only 34 grams.  His PSA has remained stable around 3.8 for the past 5 months but the overall trend is upwards with a irregular PSA velocity  Prostate MRI completed on 03/15/2023 was unremarkable.   Today, he reports no significant urinary symptoms and denies any issues with urination. He reports occasional constipation. No abdominal pain or changes in bowel habits.   PMH: Past Medical History:  Diagnosis Date   Alcoholism (HCC)    Anxiety    Arthritis    Bell's palsy    Depression    HIV (human immunodeficiency virus infection) (HCC)    Hypertension     Surgical History: Past Surgical History:  Procedure Laterality Date   LUMBAR LAMINECTOMY/DECOMPRESSION MICRODISCECTOMY N/A 02/16/2021   Procedure: OPEN LUMBAR LAMINECTOMY, LUMBAR TWO-THREE, LUMBAR THREE-FOUR, LUMBAR FOUR-FIVE,LUMBAR FIVE-SACRAL ONE;  Surgeon: Bethann Goo, DO;  Location: [facility] OR;  Service: Neurosurgery;  Laterality: N/A;    Home Medications:  Allergies as of 03/22/2023       Reactions   Varenicline Other (See Comments)   CNS symptoms - anxiety, agitation   Pollen Extract Other (See Comments)   Congestion        Medication List        Accurate as of March 22, 2023  3:10 PM. If you have any questions, ask your nurse or doctor.          Biktarvy 50-200-25 MG Tabs tablet Generic drug:  bictegravir-emtricitabine-tenofovir AF Take 1 tablet by mouth daily.   diphenhydrAMINE 25 MG tablet Commonly known as: BENADRYL Take 25 mg by mouth daily at 6 (six) AM.   DULoxetine 60 MG capsule Commonly known as: Cymbalta Take 1 capsule (60 mg total) by mouth daily.   gabapentin 600 MG tablet Commonly known as: NEURONTIN Take 0.5 tablets (300 mg total) by mouth 3 (three) times daily.   hydrochlorothiazide 25 MG tablet Commonly known as: HYDRODIURIL Take 1 tablet (25 mg total) by mouth daily.   lisinopril 40 MG tablet Commonly known as: ZESTRIL Take 1 tablet (40 mg total) by mouth daily.   Multivitamin Adults 50+ Tabs Take 1 tablet by mouth daily.   nicotine 21 mg/24hr patch Commonly known as: NICODERM CQ - dosed in mg/24 hours Place 1 patch (21 mg total) onto the skin daily as needed (nicontine dependence).   omeprazole 20 MG capsule Commonly known as: PRILOSEC Take 20 mg by mouth daily.   rOPINIRole 1 MG tablet Commonly known as: REQUIP TAKE 1 TABLET BY MOUTH AT BEDTIME   tadalafil 20 MG tablet Commonly known as: CIALIS Take 1 tablet (20 mg total) by mouth every other day as needed for erectile dysfunction. What changed: when to take this        Allergies:  Allergies  Allergen Reactions   Varenicline Other (See Comments)    CNS symptoms - anxiety, agitation     Pollen  Extract Other (See Comments)    Congestion     Family History: Family History  Problem Relation Age of Onset   CAD Father    Stomach cancer Sister    Prostate cancer Neg Hx     Social History:  reports that he has been smoking cigarettes. He has a 20 pack-year smoking history. He has never used smokeless tobacco. He reports that he does not currently use alcohol. He reports that he does not use drugs.   Physical Exam: BP 119/79   Pulse 89   Ht 5\' 8"  (1.727 m)   Wt 221 lb (100.2 kg)   BMI 33.60 kg/m   Constitutional:  Alert and oriented, No acute distress. HEENT: [place] AT,  moist mucus membranes.  Trachea midline, no masses. Neurologic: Grossly intact, no focal deficits, moving all 4 extremities. Psychiatric: Normal mood and affect.  Pertinent Imaging:  EXAM: MR PROSTATE WITHOUT AND WITH CONTRAST   TECHNIQUE: Multiplanar multisequence MRI images were obtained of the pelvis centered about the prostate. Pre and post contrast images were obtained.   CONTRAST:  9mL GADAVIST GADOBUTROL 1 MMOL/ML IV SOLN   COMPARISON:  None Available.   FINDINGS: Prostate: Hazy low T2 signal in the peripheral zone is nonfocal, likely postinflammatory, and is considered PI-RADS category 2.   Mild encapsulated nodularity of the transition zone compatible with mild benign prostatic hypertrophy, including a mild exophytic component in the left anterior transition zone at the apex.   No focal lesion of intermediate or higher suspicion for prostate cancer is identified.   Volume: Ellipsoid volume calculation: The prostate gland measures 5.0 by 3.3 by 3.9 cm (volume = 34 cm^3).   Transcapsular spread:  Absent   Seminal vesicle involvement: Absent   Neurovascular bundle involvement: Absent   Pelvic adenopathy: Absent   Bone metastasis: Absent   Other findings: Mild anterior spurring of the right sacroiliac joint. Scattered sigmoid colon diverticula. Suspected epididymal cyst or spermatocele along the right upper scrotum.   IMPRESSION: 1. No focal lesion of intermediate or higher suspicion for prostate cancer is identified. 2. Mild benign prostatic hypertrophy. 3. Scattered sigmoid colon diverticula. 4. Suspected epididymal cyst or spermatocele along the right upper scrotum.     Electronically Signed   By: Gaylyn Rong M.D.   On: 03/15/2023 14:22  This was personally reviewed and I agree with the radiologic interpretation.   Assessment & Plan:    1. Elevated PSA - PSA has stabilized at 3.8, which is reassuring. - Prostate MRI was unremarkable,  with a volume of 34 grams. - PSA density is borderline high but not significantly concerning. - PSA velocity has flattened, which is reassuring. - Continue monitoring PSA levels.  2. Right epididymal cyst - Noted on right upper scrotum on MRI, not currently symptomatic - No intervention required unless symptoms develop  Return in about 1 year (around 03/21/2024) for PSA, DRE.  I have reviewed the above documentation for accuracy and completeness, and I agree with the above.   Vanna Scotland, MD   [facility] [address], [address] [place], [place] [address] [phone]

## 2023-03-31 ENCOUNTER — Encounter: Payer: Self-pay | Admitting: Physician Assistant

## 2023-03-31 ENCOUNTER — Ambulatory Visit (INDEPENDENT_AMBULATORY_CARE_PROVIDER_SITE_OTHER): Payer: 59 | Admitting: Physician Assistant

## 2023-03-31 DIAGNOSIS — F17210 Nicotine dependence, cigarettes, uncomplicated: Secondary | ICD-10-CM | POA: Diagnosis not present

## 2023-03-31 NOTE — Progress Notes (Signed)
Virtual Visit via Telephone Note  I connected with Daryl Shelton on 03/31/23' at  11:59 AM by telephone and verified that I am speaking with the correct person using two identifiers.  Location: Patient: home Provider: working virtually from home   I discussed the limitations, risks, security and privacy concerns of performing an evaluation and management service by telephone and the availability of in person appointments. I also discussed with the patient that there may be a patient responsible charge related to this service. The patient expressed understanding and agreed to proceed.     Shared Decision Making Visit Lung Cancer Screening Program [id number])   Eligibility: Age 6 Pack Years Smoking History Calculation 25 (# packs/per year x # years smoked) Recent History of coughing up blood  No Unexplained weight loss? No ( >Than 15 pounds within the last 6 months ) Prior History Lung / other cancer No (Diagnosis within the last 5 years already requiring surveillance chest CT Scans). Smoking Status Current Smoker  Visit Components: Discussion included one or more decision making aids. Yes Discussion included risk/benefits of screening. Yes Discussion included potential follow up diagnostic testing for abnormal scans. Yes Discussion included meaning and risk of over diagnosis. Yes Discussion included meaning and risk of False Positives. Yes Discussion included meaning of total radiation exposure. Yes  Counseling Included: Importance of adherence to annual lung cancer LDCT screening. Yes Impact of comorbidities on ability to participate in the program. Yes Ability and willingness to under diagnostic treatment: Yes  Smoking Cessation Counseling: Current Smokers:  Discussed importance of smoking cessation. Yes Information about tobacco cessation classes and interventions provided to patient. Yes Symptomatic Patient. No Diagnosis Code: Tobacco Use Z72.0 Asymptomatic Patient  Yes  Counseling (Intermediate counseling: > three minutes counseling) [id number] Information about tobacco cessation classes and interventions provided to patient. Yes Written Order for Lung Cancer Screening with LDCT placed in Epic. Yes (CT Chest Lung Cancer Screening Low Dose W/O CM) [id number] Z12.2-Screening of respiratory organs Z87.891-Personal history of nicotine dependence   I have spent 25 minutes of face to face/ virtual visit  time with the patient discussing the risks and benefits of lung cancer screening. We discussed the above noted topics. We paused at intervals to allow for questions to be asked and answered to ensure understanding.We discussed that the single most powerful action that anyone can take to decrease their risk of developing lung cancer is to quit smoking.  We discussed options for tools to aid in quitting smoking including nicotine replacement therapy, non-nicotine medications, support groups, Quit Smart classes, and behavior modification. We discussed that often times setting smaller, more achievable goals, such as eliminating 1 cigarette a day for a week and then 2 cigarettes a day for a week can be helpful in slowly decreasing the number of cigarettes smoked. I provided  them  with smoking cessation  information  with contact information for community resources, classes, free nicotine replacement therapy, and access to mobile apps, text messaging, and on-line smoking cessation help. I have also provided  them  the office contact information in the event they have any questions. We discussed the time and location of the scan, and that either Abigail Miyamoto RN, Karlton Lemon, RN  or I will call / send a letter with the results within 24-72 hours of receiving them. The patient verbalized understanding of all of  the above and had no further questions upon leaving the office. They have my contact information in the event they have any further  questions.  I spent   minutes counseling  on smoking cessation and the health risks of continued tobacco abuse.  I explained to the patient that there has been a high incidence of coronary artery disease noted on these exams. I explained that this is a non-gated exam therefore degree or severity cannot be determined. This patient is not on statin therapy. I have asked the patient to follow-up with their PCP regarding any incidental finding of coronary artery disease and management with diet or medication as their PCP  feels is clinically indicated. The patient verbalized understanding of the above and had no further questions upon completion of the visit.    Darcella Gasman Destyn Parfitt, PA-C

## 2023-03-31 NOTE — Patient Instructions (Signed)

## 2023-04-05 ENCOUNTER — Ambulatory Visit
Admission: RE | Admit: 2023-04-05 | Discharge: 2023-04-05 | Disposition: A | Discharge status: Home / Self Care | Payer: 59 | Source: Ambulatory Visit | Attending: Acute Care | Admitting: Acute Care

## 2023-04-05 ENCOUNTER — Encounter: Payer: 59 | Admitting: Acute Care

## 2023-04-05 DIAGNOSIS — Z122 Encounter for screening for malignant neoplasm of respiratory organs: Secondary | ICD-10-CM | POA: Diagnosis present

## 2023-04-05 DIAGNOSIS — Z87891 Personal history of nicotine dependence: Secondary | ICD-10-CM | POA: Insufficient documentation

## 2023-04-05 DIAGNOSIS — F17210 Nicotine dependence, cigarettes, uncomplicated: Secondary | ICD-10-CM | POA: Insufficient documentation

## 2023-04-12 ENCOUNTER — Ambulatory Visit: Payer: BC Managed Care – PPO | Admitting: Dermatology

## 2023-04-14 ENCOUNTER — Other Ambulatory Visit: Payer: Self-pay

## 2023-04-14 ENCOUNTER — Encounter: Payer: Self-pay | Admitting: Family Medicine

## 2023-04-14 ENCOUNTER — Other Ambulatory Visit: Payer: Self-pay | Admitting: Family Medicine

## 2023-04-14 DIAGNOSIS — G2581 Restless legs syndrome: Secondary | ICD-10-CM

## 2023-04-14 DIAGNOSIS — Z122 Encounter for screening for malignant neoplasm of respiratory organs: Secondary | ICD-10-CM

## 2023-04-14 DIAGNOSIS — Z87891 Personal history of nicotine dependence: Secondary | ICD-10-CM

## 2023-04-14 DIAGNOSIS — F17210 Nicotine dependence, cigarettes, uncomplicated: Secondary | ICD-10-CM

## 2023-04-14 DIAGNOSIS — N521 Erectile dysfunction due to diseases classified elsewhere: Secondary | ICD-10-CM

## 2023-04-14 MED ORDER — TADALAFIL 20 MG PO TABS: 20.0000 mg | ORAL_TABLET | Freq: Every day | ORAL | 3 refills | Status: DC | PRN

## 2023-04-20 ENCOUNTER — Ambulatory Visit: Payer: 59 | Admitting: Infectious Diseases

## 2023-04-25 ENCOUNTER — Ambulatory Visit: Payer: 59 | Admitting: Infectious Diseases

## 2023-04-29 ENCOUNTER — Encounter: Payer: Self-pay | Admitting: Family Medicine

## 2023-04-29 DIAGNOSIS — M48061 Spinal stenosis, lumbar region without neurogenic claudication: Secondary | ICD-10-CM

## 2023-05-01 MED ORDER — GABAPENTIN 300 MG PO CAPS: 300.0000 mg | ORAL_CAPSULE | Freq: Three times a day (TID) | ORAL | 11 refills | Status: DC

## 2023-05-09 ENCOUNTER — Ambulatory Visit: Payer: 59 | Attending: Infectious Diseases | Admitting: Infectious Diseases

## 2023-05-09 ENCOUNTER — Other Ambulatory Visit
Admission: RE | Admit: 2023-05-09 | Discharge: 2023-05-09 | Disposition: A | Discharge status: Home / Self Care | Payer: 59 | Attending: Infectious Diseases | Admitting: Infectious Diseases

## 2023-05-09 ENCOUNTER — Encounter: Payer: Self-pay | Admitting: Infectious Diseases

## 2023-05-09 ENCOUNTER — Other Ambulatory Visit: Payer: Self-pay

## 2023-05-09 VITALS — BP 108/72 | HR 90 | Temp 97.2°F | Ht 69.0 in | Wt 206.0 lb

## 2023-05-09 DIAGNOSIS — Z113 Encounter for screening for infections with a predominantly sexual mode of transmission: Secondary | ICD-10-CM

## 2023-05-09 DIAGNOSIS — Z79899 Other long term (current) drug therapy: Secondary | ICD-10-CM | POA: Diagnosis present

## 2023-05-09 DIAGNOSIS — I10 Essential (primary) hypertension: Secondary | ICD-10-CM | POA: Diagnosis present

## 2023-05-09 DIAGNOSIS — G629 Polyneuropathy, unspecified: Secondary | ICD-10-CM | POA: Diagnosis not present

## 2023-05-09 DIAGNOSIS — L821 Other seborrheic keratosis: Secondary | ICD-10-CM | POA: Diagnosis not present

## 2023-05-09 DIAGNOSIS — B20 Human immunodeficiency virus [HIV] disease: Secondary | ICD-10-CM

## 2023-05-09 DIAGNOSIS — F32A Depression, unspecified: Secondary | ICD-10-CM | POA: Insufficient documentation

## 2023-05-09 DIAGNOSIS — F419 Anxiety disorder, unspecified: Secondary | ICD-10-CM | POA: Insufficient documentation

## 2023-05-09 DIAGNOSIS — F17210 Nicotine dependence, cigarettes, uncomplicated: Secondary | ICD-10-CM | POA: Diagnosis not present

## 2023-05-09 DIAGNOSIS — E785 Hyperlipidemia, unspecified: Secondary | ICD-10-CM | POA: Insufficient documentation

## 2023-05-09 LAB — CBC WITH DIFFERENTIAL/PLATELET
Abs Immature Granulocytes: 0.01 10*3/uL (ref 0.00–0.07)
Basophils Absolute: 0 10*3/uL (ref 0.0–0.1)
Basophils Relative: 1 %
Eosinophils Absolute: 0.1 10*3/uL (ref 0.0–0.5)
Eosinophils Relative: 3 %
HCT: 42 % (ref 39.0–52.0)
Hemoglobin: 14.3 g/dL (ref 13.0–17.0)
Immature Granulocytes: 0 %
Lymphocytes Relative: 35 %
Lymphs Abs: 1.4 10*3/uL (ref 0.7–4.0)
MCH: 31.8 pg (ref 26.0–34.0)
MCHC: 34 g/dL (ref 30.0–36.0)
MCV: 93.3 fL (ref 80.0–100.0)
Monocytes Absolute: 0.3 10*3/uL (ref 0.1–1.0)
Monocytes Relative: 8 %
Neutro Abs: 2.1 10*3/uL (ref 1.7–7.7)
Neutrophils Relative %: 53 %
Platelets: 204 10*3/uL (ref 150–400)
RBC: 4.5 MIL/uL (ref 4.22–5.81)
RDW: 12.4 % (ref 11.5–15.5)
WBC: 3.9 10*3/uL — ABNORMAL LOW (ref 4.0–10.5)
nRBC: 0 % (ref 0.0–0.2)

## 2023-05-09 LAB — COMPREHENSIVE METABOLIC PANEL
ALT: 18 U/L (ref 0–44)
AST: 25 U/L (ref 15–41)
Albumin: 4.6 g/dL (ref 3.5–5.0)
Alkaline Phosphatase: 62 U/L (ref 38–126)
Anion gap: 11 (ref 5–15)
BUN: 24 mg/dL — ABNORMAL HIGH (ref 8–23)
CO2: 28 mmol/L (ref 22–32)
Calcium: 9.3 mg/dL (ref 8.9–10.3)
Chloride: 98 mmol/L (ref 98–111)
Creatinine, Ser: 1.33 mg/dL — ABNORMAL HIGH (ref 0.61–1.24)
GFR, Estimated: >60 mL/min (ref >60)
Glucose, Bld: 91 mg/dL (ref 70–99)
Potassium: 4.4 mmol/L (ref 3.5–5.1)
Sodium: 137 mmol/L (ref 135–145)
Total Bilirubin: 1.2 mg/dL (ref 0.3–1.2)
Total Protein: 8 g/dL (ref 6.5–8.1)

## 2023-05-09 LAB — RPR: RPR Ser Ql: NONREACTIVE

## 2023-05-09 MED ORDER — BIKTARVY 50-200-25 MG PO TABS: 1.0000 | ORAL_TABLET | Freq: Every day | ORAL | 6 refills | Status: DC

## 2023-05-09 NOTE — Progress Notes (Unsigned)
NAME: Daryl Shelton  DOB: Feb 01, 1961  MRN: [id number]  Date/Time: 05/09/2023 11:38 AM  Pt is here for follow up of HIV Doing well- last seen March 2024 Last Vl 110  and cd4 795 On 100% biktarvy - HE is doing well Lost 34 pounds intentionally Since I last saw him he went to see urologist for increased PSA- mildly enlarged prostate. MRI N- Dr.Brandon will see him back in 1 year He is in a study online for doing own Viral load test at home  The following copied from last note Daryl Shelton is a 62 y.o. 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 Pt was  at [facility], until 2021 Nadir Cd4 unknown-as far as I can see [facility] records- 08/19/10- 629 OI -none  Daryl Shelton history Genvoya Dont know the medicine he took before Acquired thru sex with men  [facility]  Pt has one male partner- his husband, who is positive and on Daryl Shelton and undetectable Monogamous relationship ? 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    Family History  Problem Relation Age of Onset   CAD Father    Stomach cancer Sister    Prostate cancer Neg Hx   Twin brother -HIV 5 sisters- 3 of 5 opioid dependence due to back pain 1 sister died of gastric carcinoma  Allergies  Allergen Reactions   Varenicline Other (See  Comments)    CNS symptoms - anxiety, agitation     Pollen Extract Other (See Comments)    Congestion    ? Current Outpatient Medications  Medication Sig Dispense Refill   atorvastatin (LIPITOR) 20 MG tablet Take by mouth.     BIKTARVY 50-200-25 MG TABS tablet TAKE 1 TABLET BY MOUTH DAILY 90 tablet 1   chlorhexidine (PERIDEX) 0.12 % solution 15 mLs 2 (two) times daily.     gabapentin (NEURONTIN) 600 MG tablet Take 1 tablet by mouth 3 (three) times daily.     hydrochlorothiazide (HYDRODIURIL) 25 MG tablet TAKE 1 TABLET(25 MG) BY MOUTH DAILY 90 tablet 3   ketoconazole (NIZORAL) 2 % cream Apply 1 application topically daily. 60 g 2   lisinopril (ZESTRIL) 40 MG tablet Take by mouth.           nicotine (NICODERM CQ - DOSED IN MG/24 HOURS) 21 mg/24hr patch Place onto the skin.           sertraline (ZOLOFT) 100 MG tablet Take 200 mg by mouth daily.     tadalafil (CIALIS) 10 MG tablet Take 1 tablet (10 mg total) by mouth every other day as needed for erectile dysfunction. 10 tablet 1      0   Naprosyn MVT   REVIEW OF SYSTEMS:  Const: negative fever, negative chills, some weight loss Eyes:  negative diplopia or visual changes, negative eye pain ENT: negative coryza, negative sore throat Resp: negative cough, hemoptysis, dyspnea Cards: negative for chest pain, palpitations, lower extremity edema GU: negative for frequency, dysuria and hematuria Skin: negative for rash and pruritus Heme: negative for easy bruising and gum/nose bleeding ZH:YQMV back pain Neurolo:numbness fingers Psych:  anxiety  Objective:  VITALS:  BP 108/72   Pulse 90   Temp (!) 97.2 F (36.2 C) (Temporal)   Ht 5\' 9"  (1.753 m)   Wt 206 lb (93.4 kg)   SpO2 92%   BMI 30.42 kg/m  PHYSICAL EXAM:  General: looks well.  Head: Normocephalic, without obvious abnormality, atraumatic. Eyes: Conjunctivae clear, anicteric sclerae. Pupils are equal Nose: Nares normal. No drainage or sinus tenderness. Throat: Lips,  mucosa, and tongue normal. No Thrush Neck: symmetrical, no adenopathy, thyroid: non tender no carotid bruit and no JVD. Lungs: Clear to auscultation bilaterally. No Wheezing or Rhonchi. No rales. Heart: Regular rate and rhythm, no murmur, rub or gallop. Abdomen: Soft, non-tender,not distended. Bowel sounds normal. No masses Extremities: Extremities normal, atraumatic, no cyanosis. No edema. No clubbing Skin: No rashes or lesions. Not Jaundiced Lymph: Cervical, supraclavicular normal. Neurologic:non focal Health maintenance Vaccination Immunizations   Immunizations Name Administration Dates Next Due  COVID-19 VACCINE,MRNA(MODERNA)(PF)(IM) 11/27/2019, 10/28/2019    Hepatitis A 12/29/2010, 04/21/2010    Hepatitis B, Adult 05/10/2017, 04/21/2010, 11/04/2009, 07/08/2009    INFLUENZA INJ MDCK PF, QUAD,(FLUCELVAX)(60MO AND UP EGG FREE) 05/11/2020    INFLUENZA TIV (TRI) PF (IM) 06/23/2011, 04/21/2010, 07/08/2009    Influenza Vaccine Quad (IIV4 PF) 65mo+ injectable 05/02/2019, 05/07/2018, 05/10/2017, 05/05/2016, 10/01/2015, 08/28/2014, 05/02/2013, 06/28/2012    PNEUMOCOCCAL POLYSACCHARIDE 23 02/26/2015, 02/19/2009    PPD Test 12/29/2010, 11/04/2009    Pneumococcal Conjugate 13-Valent 03/27/2014    SHINGRIX-ZOSTER VACCINE (HZV), RECOMBINANT,SUB-UNIT,ADJUVANTED IM 02/04/2020, 08/06/2019    TdaP 02/19/2009    Tetanus-Diptheria Toxoids-TD(TDVAX),Asdorbed,2LF(IM) 08/06/2019      ______________________  Labs Lab Result  Date comment  HIV VL 110 3/24   CD4 795 3/24   Genotype     [id number]     HIV antibody     RPR NR 3/24   Quantiferon Gold NR 3/24   Hep C ab NR 6/23/   Hepatitis B-ab,ag,c Sab 124 01/28/21   Hepatitis A-IgM, IgG /T     Lipid     GC/CHL NR-rectum throat 03/11/21   PAP     HB,PLT,Cr, LFT       Preventive  Procedure Result  Date comment  colonoscopy  06/20/2017   Mammogram     Dental exam     Opthal Y 2021 Multiple polyps- done at [facility]  05/25/19- US abdomen- no  aneurysm of aorta 05/21/18 CT lung cancer screen  Impression/Recommendation HIV- - on Biktarvy- well controlled- Vl < 20 on 01/13/22 And cd4 is 640 100% adherent Labs today  HTN on lisinopril and HCTZ- controlled  Hyperlipidemia on atorvastatin Last screen on 12/22/21 Ldl 103 TC 177 HDL 51  Anxiety/depression- zoloft, DC remeron  Spinal steosis- underwent lumbar laminectomies  L2-L5 on 02/16/21-   Peripheral neuropathy Current smoker- reduced to 1/2 PPD, also uses nicotine patch  Seborrheic warts scalp/trunk- saw Derm and had cryotherapy and resolved   Pt in a monogamous relationship - partner positive and undetectable GC/Chl/Anal pap today _________________________________________________ Discussed with patient in detail.  Follow up 6 months-

## 2023-05-10 ENCOUNTER — Encounter: Payer: Self-pay | Admitting: Infectious Diseases

## 2023-05-10 LAB — T-HELPER CELLS CD4/CD8 %
% CD 4 Pos. Lymph.: 51.9 % (ref 30.8–58.5)
Absolute CD 4 Helper: 727 /uL (ref 359–1519)
Basophils Absolute: 0 10*3/uL (ref 0.0–0.2)
Basos: 1 %
CD3+CD4+ Cells/CD3+CD8+ Cells Bld: 2.53 (ref 0.92–3.72)
CD3+CD8+ Cells # Bld: 287 /uL (ref 109–897)
CD3+CD8+ Cells NFr Bld: 20.5 % (ref 12.0–35.5)
EOS (ABSOLUTE): 0.1 10*3/uL (ref 0.0–0.4)
Eos: 4 %
Hematocrit: 44 % (ref 37.5–51.0)
Hemoglobin: 14.8 g/dL (ref 13.0–17.7)
Immature Grans (Abs): 0 10*3/uL (ref 0.0–0.1)
Immature Granulocytes: 1 %
Lymphocytes Absolute: 1.4 10*3/uL (ref 0.7–3.1)
Lymphs: 35 %
MCH: 32.6 pg (ref 26.6–33.0)
MCHC: 33.6 g/dL (ref 31.5–35.7)
MCV: 97 fL (ref 79–97)
Monocytes Absolute: 0.4 10*3/uL (ref 0.1–0.9)
Monocytes: 10 %
Neutrophils Absolute: 2 10*3/uL (ref 1.4–7.0)
Neutrophils: 49 %
Platelets: 209 10*3/uL (ref 150–450)
RBC: 4.54 x10E6/uL (ref 4.14–5.80)
RDW: 12.3 % (ref 11.6–15.4)
WBC: 4 10*3/uL (ref 3.4–10.8)

## 2023-05-10 LAB — HIV-1 RNA QUANT-NO REFLEX-BLD
HIV 1 RNA Quant: 80 {copies}/mL
LOG10 HIV-1 RNA: 1.903 {Log}

## 2023-05-11 ENCOUNTER — Telehealth: Payer: Self-pay

## 2023-05-11 NOTE — Telephone Encounter (Signed)
Patient informed of lab results. Patient reports he is not taking his multivitamin with the Biktarvy.  He also states he take Naproxen for back pain. He has been advised to hold off on taking it due to his Cr and discuss with pcp. Gilbert Manolis T Pricilla Loveless

## 2023-05-11 NOTE — Telephone Encounter (Signed)
-----   Message from Lynn Ito sent at 05/11/2023  1:27 PM EDT ----- Please let him know his VL is 80 and if he is taking a MVT he should not take it with Biktarvy it has to be 2-3 hrs before or after. His Cr is borderline high- need to stay hydrated and avoid NSAID and follow up with PCP next visit . thx ----- Message ----- From: Leory Plowman, Lab In [place] Sent: 05/09/2023  11:01 AM EDT To: Lynn Ito, MD

## 2023-06-05 ENCOUNTER — Other Ambulatory Visit: Payer: Self-pay | Admitting: Medical Genetics

## 2023-06-05 DIAGNOSIS — Z006 Encounter for examination for normal comparison and control in clinical research program: Secondary | ICD-10-CM

## 2023-06-23 ENCOUNTER — Other Ambulatory Visit: Payer: Self-pay

## 2023-06-28 ENCOUNTER — Other Ambulatory Visit: Payer: Self-pay

## 2023-06-28 ENCOUNTER — Other Ambulatory Visit: Payer: Self-pay | Admitting: Family Medicine

## 2023-06-28 DIAGNOSIS — M48061 Spinal stenosis, lumbar region without neurogenic claudication: Secondary | ICD-10-CM

## 2023-06-28 DIAGNOSIS — G629 Polyneuropathy, unspecified: Secondary | ICD-10-CM

## 2023-06-28 DIAGNOSIS — F411 Generalized anxiety disorder: Secondary | ICD-10-CM

## 2023-06-28 DIAGNOSIS — I10 Essential (primary) hypertension: Secondary | ICD-10-CM

## 2023-06-28 DIAGNOSIS — F3341 Major depressive disorder, recurrent, in partial remission: Secondary | ICD-10-CM

## 2023-06-29 NOTE — Telephone Encounter (Signed)
Requested Prescriptions  Pending Prescriptions Disp Refills   DULoxetine (CYMBALTA) 60 MG capsule [Pharmacy Med Name: DULoxetine HCl 60 MG Oral Capsule Delayed Release Particles] 30 capsule 0    Sig: Take 1 capsule by mouth once daily     Psychiatry: Antidepressants - SNRI - duloxetine Failed - 06/28/2023  7:47 AM      Failed - Cr in normal range and within 360 days    Creat  Date Value Ref Range Status  01/04/2023 1.43 (H) 0.70 - 1.35 mg/dL Final   Creatinine, Ser  Date Value Ref Range Status  05/09/2023 1.33 (H) 0.61 - 1.24 mg/dL Final         Passed - eGFR is 30 or above and within 360 days    GFR, Est African American  Date Value Ref Range Status  12/31/2020 78 > OR = 60 mL/min/1.40m2 Final   GFR, Est Non African American  Date Value Ref Range Status  12/31/2020 67 > OR = 60 mL/min/1.62m2 Final   GFR, Estimated  Date Value Ref Range Status  05/09/2023 >60 >60 mL/min Final    Comment:    (NOTE) Calculated using the CKD-EPI Creatinine Equation (2021)    eGFR  Date Value Ref Range Status  01/04/2023 55 (L) > OR = 60 mL/min/1.13m2 Final         Passed - Completed PHQ-2 or PHQ-9 in the last 360 days      Passed - Last BP in normal range    BP Readings from Last 1 Encounters:  05/09/23 108/72         Passed - Valid encounter within last 6 months    Recent Outpatient Visits           5 months ago Annual physical exam   [place] [facility] Smitty Cords, DO   8 months ago Obesity (BMI 30.0-34.9)   [place] [facility] Smitty Cords, DO   11 months ago Elevated PSA, less than 10 ng/ml   [place] [facility] Smitty Cords, DO   1 year ago Annual physical exam   [place] [facility] Smitty Cords, DO   1 year ago Major depressive disorder, recurrent, in partial remission [facility])   [place] [facility], Netta Neat, DO        Future Appointments             In 2 weeks Althea Charon, Netta Neat, DO [place] [facility], [place]   In 2 months Deirdre Evener, MD [place] [facility]   In 4 months Lynn Ito, MD [facility]   In 8 months Vanna Scotland, MD [facility] [place]             hydrochlorothiazide (HYDRODIURIL) 25 MG tablet [Pharmacy Med Name: hydroCHLOROthiazide 25 MG Oral Tablet] 30 tablet 0    Sig: Take 1 tablet by mouth once daily     Cardiovascular: Diuretics - Thiazide Failed - 06/28/2023  7:47 AM      Failed - Cr in normal range and within 180 days    Creat  Date Value Ref Range Status  01/04/2023 1.43 (H) 0.70 - 1.35 mg/dL Final   Creatinine, Ser  Date Value Ref Range Status  05/09/2023 1.33 (H) 0.61 - 1.24 mg/dL Final         Passed - K in normal range and within 180  days    Potassium  Date Value Ref Range Status  05/09/2023 4.4 3.5 - 5.1 mmol/L Final         Passed - Na in normal range and within 180 days    Sodium  Date Value Ref Range Status  05/09/2023 137 135 - 145 mmol/L Final         Passed - Last BP in normal range    BP Readings from Last 1 Encounters:  05/09/23 108/72         Passed - Valid encounter within last 6 months    Recent Outpatient Visits           5 months ago Annual physical exam   [place] [facility] [place], Netta Neat, DO   8 months ago Obesity (BMI 30.0-34.9)   [place] [facility] Smitty Cords, DO   11 months ago Elevated PSA, less than 10 ng/ml   [place] [facility] [place], Netta Neat, DO   1 year ago Annual physical exam   [place] [facility] Smitty Cords, DO   1 year ago Major depressive disorder, recurrent, in partial remission [facility])   [place] [facility] [place], Netta Neat, DO       Future Appointments              In 2 weeks Althea Charon, Netta Neat, DO [place] [facility], [place]   In 2 months Deirdre Evener, MD [place] [facility]   In 4 months Lynn Ito, MD [facility]   In 8 months Vanna Scotland, MD [facility] [place]             lisinopril (ZESTRIL) 40 MG tablet [Pharmacy Med Name: Lisinopril 40 MG Oral Tablet] 30 tablet 0    Sig: Take 1 tablet by mouth once daily     Cardiovascular:  ACE Inhibitors Failed - 06/28/2023  7:47 AM      Failed - Cr in normal range and within 180 days    Creat  Date Value Ref Range Status  01/04/2023 1.43 (H) 0.70 - 1.35 mg/dL Final   Creatinine, Ser  Date Value Ref Range Status  05/09/2023 1.33 (H) 0.61 - 1.24 mg/dL Final         Passed - K in normal range and within 180 days    Potassium  Date Value Ref Range Status  05/09/2023 4.4 3.5 - 5.1 mmol/L Final         Passed - Patient is not pregnant      Passed - Last BP in normal range    BP Readings from Last 1 Encounters:  05/09/23 108/72         Passed - Valid encounter within last 6 months    Recent Outpatient Visits           5 months ago Annual physical exam   [place] [facility] Smitty Cords, DO   8 months ago Obesity (BMI 30.0-34.9)   [place] [facility] Smitty Cords, [place]   11 months ago Elevated PSA, less than 10 ng/ml   [place] [facility] Smitty Cords, DO   1 year ago Annual physical exam   [place] [facility] Smitty Cords, DO   1 year ago Major depressive disorder, recurrent, in partial remission (HCC)  [place] [facility] Althea Charon, Netta Neat, DO       Future Appointments             In 2 weeks Althea Charon, Netta Neat, DO [facility], [place]   In 2 months Deirdre Evener, MD [facility]   In 4  months Lynn Ito, MD [facility]   In 8 months Vanna Scotland, MD [facility]

## 2023-07-06 ENCOUNTER — Encounter: Payer: Self-pay | Admitting: Infectious Diseases

## 2023-07-12 ENCOUNTER — Ambulatory Visit: Payer: 59 | Admitting: Family Medicine

## 2023-07-18 ENCOUNTER — Other Ambulatory Visit: Payer: Self-pay | Admitting: Family Medicine

## 2023-07-18 ENCOUNTER — Ambulatory Visit (INDEPENDENT_AMBULATORY_CARE_PROVIDER_SITE_OTHER): Payer: HMO | Admitting: Family Medicine

## 2023-07-18 ENCOUNTER — Encounter: Payer: Self-pay | Admitting: Family Medicine

## 2023-07-18 VITALS — BP 122/68 | HR 82 | Ht 69.0 in | Wt 211.0 lb

## 2023-07-18 DIAGNOSIS — I10 Essential (primary) hypertension: Secondary | ICD-10-CM

## 2023-07-18 DIAGNOSIS — Z0000 Encounter for general adult medical examination without abnormal findings: Secondary | ICD-10-CM

## 2023-07-18 DIAGNOSIS — F411 Generalized anxiety disorder: Secondary | ICD-10-CM

## 2023-07-18 DIAGNOSIS — E782 Mixed hyperlipidemia: Secondary | ICD-10-CM

## 2023-07-18 DIAGNOSIS — E66811 Obesity, class 1: Secondary | ICD-10-CM

## 2023-07-18 DIAGNOSIS — M48061 Spinal stenosis, lumbar region without neurogenic claudication: Secondary | ICD-10-CM

## 2023-07-18 DIAGNOSIS — G2581 Restless legs syndrome: Secondary | ICD-10-CM

## 2023-07-18 DIAGNOSIS — F3341 Major depressive disorder, recurrent, in partial remission: Secondary | ICD-10-CM

## 2023-07-18 DIAGNOSIS — G629 Polyneuropathy, unspecified: Secondary | ICD-10-CM | POA: Diagnosis not present

## 2023-07-18 DIAGNOSIS — R7309 Other abnormal glucose: Secondary | ICD-10-CM

## 2023-07-18 MED ORDER — LISINOPRIL 40 MG PO TABS: 40.0000 mg | ORAL_TABLET | Freq: Every day | ORAL | 3 refills | Status: DC

## 2023-07-18 MED ORDER — HYDROCHLOROTHIAZIDE 25 MG PO TABS: 25.0000 mg | ORAL_TABLET | Freq: Every day | ORAL | 3 refills | Status: DC

## 2023-07-18 MED ORDER — ROPINIROLE HCL 1 MG PO TABS
1.0000 mg | ORAL_TABLET | Freq: Every day | ORAL | 3 refills | Status: DC
Start: 2023-07-18 — End: 2024-01-16

## 2023-07-18 MED ORDER — DULOXETINE HCL 60 MG PO CPEP: 60.0000 mg | ORAL_CAPSULE | Freq: Every day | ORAL | 3 refills | Status: DC

## 2023-07-18 NOTE — Patient Instructions (Addendum)
Thank you for coming to the office today.  Refilled medications  Referral to Psychiatry + therapist Dr [facility] [address] [place], [place] [address] PHONE [phone]  FAX [phone]   Please schedule a Follow-up Appointment to: Return for 6 month fasting lab > 1 week later Annual Physical.  If you have any other questions or concerns, please feel free to call the office or send a message through MyChart. You may also schedule an earlier appointment if necessary.  Additionally, you may be receiving a survey about your experience at our office within a few days to 1 week by e-mail or mail. We value your feedback.  Saralyn Pilar, DO [facility], [place]

## 2023-07-18 NOTE — Progress Notes (Signed)
Subjective:    Patient ID: Daryl Shelton, male    DOB: 04/17/1961, 62 y.o.   MRN: [id number]  Daryl Shelton is a 62 y.o. male presenting on 07/18/2023 for Medical Management of Chronic Issues (Follow up )   HPI  Discussed the use of AI scribe software for clinical note transcription with the patient, who gave verbal consent to proceed.  They report a significant weight loss of 40 pounds since starting duloxetine, a medication prescribed for neuropathy, mental health, and weight loss. The patient is pleased with the weight loss and the effect of the medication on their mental health. However, they express concerns about increasing irritability and a desire to gain better control over their mental state.  The patient has been exploring mindfulness techniques but feels they are not achieving the desired level of mindfulness. They express interest in seeking additional mental health support, specifically from a group called [facility], which offers mindfulness, yoga, and potential medication management. The patient is not currently on any psychotropic medications.  Despite reporting that they only sleep for three to four hours a night, the patient does not express any significant concerns about their sleep pattern. They do not experience daytime sleepiness or napping and remain active throughout the day. The patient has joined a program called [employer] but has not yet started attending a gym or watching their videos. They believe their daily activities, which include vacuuming the entire house and moving up and down ladders in their shed, provide sufficient physical activity.      Elevated PSA Followed by [facility] Dr Apolinar Junes Last PSA 3.8, they are going to monitor and pursue Prostate MRI as indicated   CHRONIC HTN: Reports no concerns Current Meds - Lisinopril 40mg  daily, HCTZ 25mg  daily   Reports good compliance, took meds today. Tolerating well, w/o  complaints. Denies CP, dyspnea, HA, edema, dizziness / lightheadedness   Obesity BMI >34 Previously trial on Contrave, since GLP1 not covered. He did trial of Saxenda but not available. Weight gain still after treatment on Contrave Also tried Phentermine 37.5mg  without significant appetite suppression  Failed regular diet exercise regimen   Lumbar DDD S/p Spinal surgery Lumbar Laminectomy 02/16/21 Dr Dawley He had complications with numbness from waist down, has improved. - He also has some cervical spine DDD   Alcohol Abuse history Neuropathy + RLS Abstained now from alcohol since 09/22/20, he was drinking daily for 30 years Prior history  R worse than Left. Both burning and also has RLS. 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 taking Ropinirole earlier in mid afternoon, current dose 1mg  daily works well. Instead of taking at bedtime.   Reduced Gabapentin from 900 THREE TIMES A DAY down to 300mg  THREE TIMES A DAY, he has been reducing and breaking tablet. He would like to taper off, he has lost some weight off Gabapentin.  ED On 20mg  Tadalafil Cialis regularly   Major Depression recurrent Generalized Anxiety Hx Slight Agoraphobia History of childhood trauma, contributed  Off Sertraline, Remeron On mild mood anxiety He has practiced Mindfulness  Now interested in referral to Psychiatry   HIV, chronic, controlled and undetectable Followed by Dr Rivka Safer ([facility]) On Biktarvy   HYPERLIPIDEMIA: - Reports no concerns. Last lipid panel 12/2022, normal, without statin > 9 months was on atorvastatin 20mg  Not on therapy Lifestyle - Diet: improved plant based diet  physical activity, some exercise, 2.7 miles avg, avg 6-10 miles per month pedometer  07/18/2023    9:21 AM 05/09/2023   11:02 AM 01/04/2023    9:22 AM  Depression screen PHQ 2/9  Decreased Interest 0 0 0  Down, Depressed, Hopeless 1 1 1   PHQ - 2 Score 1 1 1   Altered sleeping 1    Tired,  decreased energy 1    Change in appetite 1    Feeling bad or failure about yourself  1    Trouble concentrating 0    Moving slowly or fidgety/restless 0    Suicidal thoughts 0    PHQ-9 Score 5         07/18/2023    9:21 AM 01/04/2023    9:22 AM 10/06/2022   10:03 AM 12/31/2021    2:49 PM  GAD 7 : Generalized Anxiety Score  Nervous, Anxious, on Edge 1 1 1 1   Control/stop worrying 1 1 0 0  Worry too much - different things 1 1 0 1  Trouble relaxing 1 1 1  0  Restless 1 0 0 0  Easily annoyed or irritable 1 1 1  0  Afraid - awful might happen 1 1 1 1   Total GAD 7 Score 7 6 4 3   Anxiety Difficulty   Not difficult at all Not difficult at all    Social History   Tobacco Use   Smoking status: Every Day    Current packs/day: 0.50    Average packs/day: 0.5 packs/day for 50.0 years (25.0 ttl pk-yrs)    Types: Cigarettes   Smokeless tobacco: Never  Vaping Use   Vaping status: Never Used  Substance Use Topics   Alcohol use: Not Currently   Drug use: No    Review of Systems Per HPI unless specifically indicated above     Objective:    BP 122/68 (BP Location: Left Arm, Cuff Size: Normal)   Pulse 82   Ht 5\' 9"  (1.753 m)   Wt 211 lb (95.7 kg)   BMI 31.16 kg/m   Wt Readings from Last 3 Encounters:  07/18/23 211 lb (95.7 kg)  05/09/23 206 lb (93.4 kg)  04/05/23 209 lb (94.8 kg)    Physical Exam Vitals and nursing note reviewed.  Constitutional:      General: He is not in acute distress.    Appearance: He is well-developed. He is not diaphoretic.     Comments: Well-appearing, comfortable, cooperative  HENT:     Head: Normocephalic and atraumatic.  Eyes:     General:        Right eye: No discharge.        Left eye: No discharge.     Conjunctiva/sclera: Conjunctivae normal.  Neck:     Thyroid: No thyromegaly.  Cardiovascular:     Rate and Rhythm: Normal rate and regular rhythm.     Pulses: Normal pulses.     Heart sounds: Normal heart sounds. No murmur  heard. Pulmonary:     Effort: Pulmonary effort is normal. No respiratory distress.     Breath sounds: Normal breath sounds. No wheezing or rales.  Musculoskeletal:        General: Normal range of motion.     Cervical back: Normal range of motion and neck supple.  Lymphadenopathy:     Cervical: No cervical adenopathy.  Skin:    General: Skin is warm and dry.     Findings: No erythema or rash.  Neurological:     Mental Status: He is alert and oriented to person, place, and time. Mental status is  at baseline.  Psychiatric:        Behavior: Behavior normal.     Comments: Well groomed, good eye contact, normal speech and thoughts     Results for orders placed or performed during the hospital encounter of 05/09/23  T-helper cells CD4/CD8 %  Result Value Ref Range   Absolute CD 4 Helper 727 359 - 1,519 /uL   % CD 4 Pos. Lymph. 51.9 30.8 - 58.5 %   CD3+CD8+ Cells # Bld 287 109 - 897 /uL   CD3+CD8+ Cells NFr Bld 20.5 12.0 - 35.5 %   CD3+CD4+ Cells/CD3+CD8+ Cells Bld 2.53 0.92 - 3.72   WBC 4.0 3.4 - 10.8 x10E3/uL   RBC 4.54 4.14 - 5.80 x10E6/uL   Hemoglobin 14.8 13.0 - 17.7 g/dL   Hematocrit 13.0 86.5 - 51.0 %   MCV 97 79 - 97 fL   MCH 32.6 26.6 - 33.0 pg   MCHC 33.6 31.5 - 35.7 g/dL   RDW 78.4 69.6 - 29.5 %   Platelets 209 150 - 450 x10E3/uL   Neutrophils 49 Not Estab. %   Lymphs 35 Not Estab. %   Monocytes 10 Not Estab. %   Eos 4 Not Estab. %   Basos 1 Not Estab. %   Neutrophils Absolute 2.0 1.4 - 7.0 x10E3/uL   Lymphocytes Absolute 1.4 0.7 - 3.1 x10E3/uL   Monocytes Absolute 0.4 0.1 - 0.9 x10E3/uL   EOS (ABSOLUTE) 0.1 0.0 - 0.4 x10E3/uL   Basophils Absolute 0.0 0.0 - 0.2 x10E3/uL   Immature Granulocytes 1 Not Estab. %   Immature Grans (Abs) 0.0 0.0 - 0.1 x10E3/uL  Comprehensive metabolic panel  Result Value Ref Range   Sodium 137 135 - 145 mmol/L   Potassium 4.4 3.5 - 5.1 mmol/L   Chloride 98 98 - 111 mmol/L   CO2 28 22 - 32 mmol/L   Glucose, Bld 91 70 - 99 mg/dL    BUN 24 (H) 8 - 23 mg/dL   Creatinine, Ser 2.84 (H) 0.61 - 1.24 mg/dL   Calcium 9.3 8.9 - 13.2 mg/dL   Total Protein 8.0 6.5 - 8.1 g/dL   Albumin 4.6 3.5 - 5.0 g/dL   AST 25 15 - 41 U/L   ALT 18 0 - 44 U/L   Alkaline Phosphatase 62 38 - 126 U/L   Total Bilirubin 1.2 0.3 - 1.2 mg/dL   GFR, Estimated >44 >01 mL/min   Anion gap 11 5 - 15  RPR  Result Value Ref Range   RPR Ser Ql NON REACTIVE NON REACTIVE  CBC with Differential/Platelet  Result Value Ref Range   WBC 3.9 (L) 4.0 - 10.5 K/uL   RBC 4.50 4.22 - 5.81 MIL/uL   Hemoglobin 14.3 13.0 - 17.0 g/dL   HCT 02.7 25.3 - 66.4 %   MCV 93.3 80.0 - 100.0 fL   MCH 31.8 26.0 - 34.0 pg   MCHC 34.0 30.0 - 36.0 g/dL   RDW 40.3 47.4 - 25.9 %   Platelets 204 150 - 400 K/uL   nRBC 0.0 0.0 - 0.2 %   Neutrophils Relative % 53 %   Neutro Abs 2.1 1.7 - 7.7 K/uL   Lymphocytes Relative 35 %   Lymphs Abs 1.4 0.7 - 4.0 K/uL   Monocytes Relative 8 %   Monocytes Absolute 0.3 0.1 - 1.0 K/uL   Eosinophils Relative 3 %   Eosinophils Absolute 0.1 0.0 - 0.5 K/uL   Basophils Relative 1 %   Basophils Absolute  0.0 0.0 - 0.1 K/uL   Immature Granulocytes 0 %   Abs Immature Granulocytes 0.01 0.00 - 0.07 K/uL  HIV-1 RNA quant-no reflex-bld  Result Value Ref Range   HIV 1 RNA Quant 80 copies/mL   LOG10 HIV-1 RNA 1.903 log10copy/mL      Assessment & Plan:   Problem List Items Addressed This Visit     Essential hypertension   Relevant Medications   hydrochlorothiazide (HYDRODIURIL) 25 MG tablet   lisinopril (ZESTRIL) 40 MG tablet   GAD (generalized anxiety disorder) - Primary   Relevant Medications   DULoxetine (CYMBALTA) 60 MG capsule   Other Relevant Orders   Ambulatory referral to Psychiatry   Major depressive disorder, recurrent, in partial remission (HCC)   Relevant Medications   DULoxetine (CYMBALTA) 60 MG capsule   Other Relevant Orders   Ambulatory referral to Psychiatry   Neuropathy   Relevant Medications   DULoxetine (CYMBALTA) 60 MG  capsule   Other Visit Diagnoses     Spinal stenosis of lumbar region, unspecified whether neurogenic claudication present       Relevant Medications   DULoxetine (CYMBALTA) 60 MG capsule   Restless leg syndrome       Relevant Medications   rOPINIRole (REQUIP) 1 MG tablet         Major Depression and Anxiety Patient expressed interest in mindfulness-based therapy and potential medication management. Discussed referral to [facility] w/ Dr. Caryn Section. Submitted referral today  HTN Hydrochlorothiazide 25mg  daily BP improved on repeat check. Normalized on current medication  Peripheral Neuropathy Patient reports some relief with Duloxetine and Neurontin, but still experiences occasional symptoms.  -Continue Duloxetine and Neurontin as currently prescribed.  Erectile Dysfunction Patient reports good response to daily Cialis 20mg . -Refill Cialis 20mg  prescription for daily use.  HIV Managed by ID Dr Rivka Safer Patient reports successful reimbursement for HIV medication (Biktarvy) through insurance. -Continue current HIV management with Biktarvy.  Insomnia Patient reports getting 3-4 hours of sleep per night but does not feel bothered by this. Discussed potential link to mental health and the possibility of addressing this with a psychologist. -No changes to current management.  General Health Maintenance -Next appointment scheduled for end of May/early June. -Refill all necessary prescriptions. -Encourage continued physical activity as tolerated.         Orders Placed This Encounter  Procedures   Ambulatory referral to Psychiatry    Referral Priority:   Routine    Referral Type:   Psychiatric    Referral Reason:   Specialty Services Required    Requested Specialty:   Psychiatry    Number of Visits Requested:   1    Meds ordered this encounter  Medications   DULoxetine (CYMBALTA) 60 MG capsule    Sig: Take 1 capsule (60 mg total) by mouth  daily.    Dispense:  90 capsule    Refill:  3    Add future refills   hydrochlorothiazide (HYDRODIURIL) 25 MG tablet    Sig: Take 1 tablet (25 mg total) by mouth daily.    Dispense:  90 tablet    Refill:  3    Add future refills   lisinopril (ZESTRIL) 40 MG tablet    Sig: Take 1 tablet (40 mg total) by mouth daily.    Dispense:  90 tablet    Refill:  3    Add future refills   rOPINIRole (REQUIP) 1 MG tablet    Sig: Take 1 tablet (1 mg total) by  mouth at bedtime.    Dispense:  90 tablet    Refill:  3    Add future refills    Follow up plan: Return for 6 month fasting lab > 1 week later Annual Physical.  Future labs ordered for 01/16/24   Saralyn Pilar, DO [facility] 07/18/2023, 9:30 AM

## 2023-08-25 ENCOUNTER — Encounter: Payer: Self-pay | Admitting: Family Medicine

## 2023-08-25 DIAGNOSIS — E782 Mixed hyperlipidemia: Secondary | ICD-10-CM

## 2023-08-28 MED ORDER — ATORVASTATIN CALCIUM 20 MG PO TABS: 20.0000 mg | ORAL_TABLET | Freq: Every day | ORAL | 3 refills | Status: DC

## 2023-09-05 ENCOUNTER — Ambulatory Visit: Payer: BC Managed Care – PPO | Admitting: Dermatology

## 2023-09-27 ENCOUNTER — Ambulatory Visit: Payer: BC Managed Care – PPO | Admitting: Dermatology

## 2023-10-11 ENCOUNTER — Encounter: Payer: Self-pay | Admitting: Dermatology

## 2023-10-11 ENCOUNTER — Ambulatory Visit: Payer: BC Managed Care – PPO | Admitting: Dermatology

## 2023-10-11 DIAGNOSIS — L821 Other seborrheic keratosis: Secondary | ICD-10-CM

## 2023-10-11 DIAGNOSIS — D3610 Benign neoplasm of peripheral nerves and autonomic nervous system, unspecified: Secondary | ICD-10-CM

## 2023-10-11 DIAGNOSIS — L820 Inflamed seborrheic keratosis: Secondary | ICD-10-CM

## 2023-10-11 DIAGNOSIS — D3612 Benign neoplasm of peripheral nerves and autonomic nervous system, upper limb, including shoulder: Secondary | ICD-10-CM | POA: Diagnosis not present

## 2023-10-11 DIAGNOSIS — B079 Viral wart, unspecified: Secondary | ICD-10-CM | POA: Diagnosis not present

## 2023-10-11 NOTE — Patient Instructions (Addendum)

## 2023-10-11 NOTE — Progress Notes (Signed)
   Follow-Up Visit   Subjective  Daryl Shelton is a 63 y.o. male who presents for the following: warts on hands that have been treated with LN2 in the past, place at L upper abdomen that is bothersome and irritating he would like looked at.   The patient has spots, moles and lesions to be evaluated, some may be new or changing and the patient may have concern these could be cancer.   The following portions of the chart were reviewed this encounter and updated as appropriate: medications, allergies, medical history  Review of Systems:  No other skin or systemic complaints except as noted in HPI or Assessment and Plan.  Objective  Well appearing patient in no apparent distress; mood and affect are within normal limits.   A focused examination was performed of the following areas: Hands, abdomen   Relevant exam findings are noted in the Assessment and Plan.  L wrist x1 Verrucous papules -- Discussed viral etiology and contagion. L abdomen x2 (2) Stuck on waxy paps with erythema  Assessment & Plan   SEBORRHEIC KERATOSIS - Stuck-on, waxy, tan-brown papules and/or plaques  - Benign-appearing - Discussed benign etiology and prognosis. - Observe - Call for any changes  VIRAL WARTS, UNSPECIFIED TYPE L wrist x1 Viral Wart (HPV) Counseling  Discussed viral / HPV (Human Papilloma Virus) etiology and risk of spread /infectivity to other areas of body as well as to other people.  Multiple treatments and methods may be required to clear warts and it is possible treatment may not be successful.  Treatment risks include discoloration; scarring and there is still potential for wart recurrence. Destruction of lesion - L wrist x1 Complexity: simple   Destruction method: cryotherapy   Informed consent: discussed and consent obtained   Timeout:  patient name, date of birth, surgical site, and procedure verified Lesion destroyed using liquid nitrogen: Yes   Region frozen until ice ball  extended beyond lesion: Yes   Cryo cycles: 1 or 2. Outcome: patient tolerated procedure well with no complications   Post-procedure details: wound care instructions given   INFLAMED SEBORRHEIC KERATOSIS (2) L abdomen x2 (2) Symptomatic, irritating, patient would like treated. Destruction of lesion - L abdomen x2 (2) Complexity: simple   Destruction method: cryotherapy   Informed consent: discussed and consent obtained   Timeout:  patient name, date of birth, surgical site, and procedure verified Lesion destroyed using liquid nitrogen: Yes   Region frozen until ice ball extended beyond lesion: Yes   Cryo cycles: 1 or 2. Outcome: patient tolerated procedure well with no complications   Post-procedure details: wound care instructions given   NEUROFIBROMA R wrist SEBORRHEIC KERATOSES    Return if symptoms worsen or fail to improve.  Wynonia Lawman, CMA, am acting as scribe for Elie Goody, MD .   Documentation: I have reviewed the above documentation for accuracy and completeness, and I agree with the above.  Elie Goody, MD

## 2023-10-12 ENCOUNTER — Other Ambulatory Visit: Payer: Self-pay

## 2023-10-12 DIAGNOSIS — B20 Human immunodeficiency virus [HIV] disease: Secondary | ICD-10-CM

## 2023-10-12 DIAGNOSIS — Z79899 Other long term (current) drug therapy: Secondary | ICD-10-CM

## 2023-10-12 DIAGNOSIS — Z113 Encounter for screening for infections with a predominantly sexual mode of transmission: Secondary | ICD-10-CM

## 2023-10-30 ENCOUNTER — Other Ambulatory Visit
Admission: RE | Admit: 2023-10-30 | Discharge: 2023-10-30 | Disposition: A | Discharge status: Home / Self Care | Source: Ambulatory Visit | Attending: Infectious Diseases | Admitting: Infectious Diseases

## 2023-10-30 DIAGNOSIS — B20 Human immunodeficiency virus [HIV] disease: Secondary | ICD-10-CM | POA: Insufficient documentation

## 2023-10-30 DIAGNOSIS — Z113 Encounter for screening for infections with a predominantly sexual mode of transmission: Secondary | ICD-10-CM | POA: Diagnosis not present

## 2023-10-30 LAB — CBC WITH DIFFERENTIAL/PLATELET
Abs Immature Granulocytes: 0.02 10*3/uL (ref 0.00–0.07)
Basophils Absolute: 0 10*3/uL (ref 0.0–0.1)
Basophils Relative: 0 %
Eosinophils Absolute: 0.1 10*3/uL (ref 0.0–0.5)
Eosinophils Relative: 2 %
HCT: 42 % (ref 39.0–52.0)
Hemoglobin: 13.9 g/dL (ref 13.0–17.0)
Immature Granulocytes: 0 %
Lymphocytes Relative: 36 %
Lymphs Abs: 1.7 10*3/uL (ref 0.7–4.0)
MCH: 31.7 pg (ref 26.0–34.0)
MCHC: 33.1 g/dL (ref 30.0–36.0)
MCV: 95.9 fL (ref 80.0–100.0)
Monocytes Absolute: 0.5 10*3/uL (ref 0.1–1.0)
Monocytes Relative: 9 %
Neutro Abs: 2.5 10*3/uL (ref 1.7–7.7)
Neutrophils Relative %: 53 %
Platelets: 185 10*3/uL (ref 150–400)
RBC: 4.38 MIL/uL (ref 4.22–5.81)
RDW: 13 % (ref 11.5–15.5)
WBC: 4.8 10*3/uL (ref 4.0–10.5)
nRBC: 0 % (ref 0.0–0.2)

## 2023-10-30 LAB — COMPREHENSIVE METABOLIC PANEL WITH GFR
ALT: 23 U/L (ref 0–44)
AST: 30 U/L (ref 15–41)
Albumin: 4.5 g/dL (ref 3.5–5.0)
Alkaline Phosphatase: 66 U/L (ref 38–126)
Anion gap: 11 (ref 5–15)
BUN: 24 mg/dL — ABNORMAL HIGH (ref 8–23)
CO2: 27 mmol/L (ref 22–32)
Calcium: 9.4 mg/dL (ref 8.9–10.3)
Chloride: 97 mmol/L — ABNORMAL LOW (ref 98–111)
Creatinine, Ser: 1.22 mg/dL (ref 0.61–1.24)
GFR, Estimated: >60 mL/min
Glucose, Bld: 85 mg/dL (ref 70–99)
Potassium: 4.2 mmol/L (ref 3.5–5.1)
Sodium: 135 mmol/L (ref 135–145)
Total Bilirubin: 0.7 mg/dL (ref 0.0–1.2)
Total Protein: 7.7 g/dL (ref 6.5–8.1)

## 2023-10-31 LAB — HIV-1 RNA QUANT-NO REFLEX-BLD
HIV 1 RNA Quant: 20 {copies}/mL
LOG10 HIV-1 RNA: 1.301 {Log_copies}/mL

## 2023-10-31 LAB — T-HELPER CELLS CD4/CD8 %
% CD 4 Pos. Lymph.: 58.1 % (ref 30.8–58.5)
Absolute CD 4 Helper: 872 /uL (ref 359–1519)
Basophils Absolute: 0 10*3/uL (ref 0.0–0.2)
Basos: 1 %
CD3+CD4+ Cells/CD3+CD8+ Cells Bld: 3.17 (ref 0.92–3.72)
CD3+CD8+ Cells # Bld: 275 /uL (ref 109–897)
CD3+CD8+ Cells NFr Bld: 18.3 % (ref 12.0–35.5)
EOS (ABSOLUTE): 0.1 10*3/uL (ref 0.0–0.4)
Eos: 3 %
Hematocrit: 42.1 % (ref 37.5–51.0)
Hemoglobin: 14.2 g/dL (ref 13.0–17.7)
Immature Grans (Abs): 0 10*3/uL (ref 0.0–0.1)
Immature Granulocytes: 1 %
Lymphocytes Absolute: 1.5 10*3/uL (ref 0.7–3.1)
Lymphs: 35 %
MCH: 31.4 pg (ref 26.6–33.0)
MCHC: 33.7 g/dL (ref 31.5–35.7)
MCV: 93 fL (ref 79–97)
Monocytes Absolute: 0.4 10*3/uL (ref 0.1–0.9)
Monocytes: 10 %
Neutrophils Absolute: 2.3 10*3/uL (ref 1.4–7.0)
Neutrophils: 50 %
Platelets: 203 10*3/uL (ref 150–450)
RBC: 4.52 x10E6/uL (ref 4.14–5.80)
RDW: 12.5 % (ref 11.6–15.4)
WBC: 4.4 10*3/uL (ref 3.4–10.8)

## 2023-10-31 LAB — RPR: RPR Ser Ql: NONREACTIVE

## 2023-11-01 ENCOUNTER — Other Ambulatory Visit
Admission: RE | Admit: 2023-11-01 | Discharge: 2023-11-01 | Disposition: A | Discharge status: Home / Self Care | Payer: Self-pay | Source: Ambulatory Visit | Attending: Medical Genetics | Admitting: Medical Genetics

## 2023-11-01 DIAGNOSIS — Z006 Encounter for examination for normal comparison and control in clinical research program: Secondary | ICD-10-CM | POA: Insufficient documentation

## 2023-11-02 LAB — QUANTIFERON-TB GOLD PLUS (RQFGPL)
QuantiFERON Mitogen Value: >10 [IU]/mL
QuantiFERON Nil Value: 0.15 [IU]/mL
QuantiFERON TB1 Ag Value: 0.14 [IU]/mL
QuantiFERON TB2 Ag Value: 0.14 [IU]/mL

## 2023-11-02 LAB — QUANTIFERON-TB GOLD PLUS: QuantiFERON-TB Gold Plus: NEGATIVE

## 2023-11-07 ENCOUNTER — Ambulatory Visit: Payer: Medicare Other | Admitting: Infectious Diseases

## 2023-11-07 ENCOUNTER — Encounter: Payer: Self-pay | Admitting: Infectious Diseases

## 2023-11-07 ENCOUNTER — Telehealth: Admitting: Infectious Diseases

## 2023-11-09 ENCOUNTER — Ambulatory Visit: Attending: Infectious Diseases | Admitting: Infectious Diseases

## 2023-11-09 DIAGNOSIS — F17210 Nicotine dependence, cigarettes, uncomplicated: Secondary | ICD-10-CM | POA: Diagnosis not present

## 2023-11-09 DIAGNOSIS — B20 Human immunodeficiency virus [HIV] disease: Secondary | ICD-10-CM | POA: Diagnosis not present

## 2023-11-09 DIAGNOSIS — L821 Other seborrheic keratosis: Secondary | ICD-10-CM | POA: Diagnosis not present

## 2023-11-09 NOTE — Progress Notes (Unsigned)
The purpose of this virtual visit is to provide medical care while limiting exposure to the novel coronavirus (COVID19) for both patient and office staff.   Consent was obtained for phone visit:  Yes.   Answered questions that patient had about telehealth interaction:  Yes.   I discussed the limitations, risks, security and privacy concerns of performing an evaluation and management service by telephone. I also discussed with the patient that there may be a patient responsible charge related to this service. The patient expressed understanding and agreed to proceed.   Patient Location: Home Provider Location:  

## 2023-11-14 LAB — GENECONNECT MOLECULAR SCREEN: Genetic Analysis Overall Interpretation: NEGATIVE

## 2023-12-19 ENCOUNTER — Other Ambulatory Visit: Payer: Self-pay

## 2023-12-19 MED ORDER — BIKTARVY 50-200-25 MG PO TABS: 1.0000 | ORAL_TABLET | Freq: Every day | ORAL | 6 refills | Status: DC

## 2024-01-16 ENCOUNTER — Other Ambulatory Visit: Payer: Self-pay

## 2024-01-16 ENCOUNTER — Ambulatory Visit (INDEPENDENT_AMBULATORY_CARE_PROVIDER_SITE_OTHER): Payer: Self-pay | Admitting: Family Medicine

## 2024-01-16 ENCOUNTER — Encounter: Payer: Self-pay | Admitting: Family Medicine

## 2024-01-16 ENCOUNTER — Other Ambulatory Visit

## 2024-01-16 VITALS — BP 118/68 | HR 94 | Ht 69.0 in | Wt 214.2 lb

## 2024-01-16 DIAGNOSIS — B20 Human immunodeficiency virus [HIV] disease: Secondary | ICD-10-CM | POA: Diagnosis not present

## 2024-01-16 DIAGNOSIS — I10 Essential (primary) hypertension: Secondary | ICD-10-CM

## 2024-01-16 DIAGNOSIS — E782 Mixed hyperlipidemia: Secondary | ICD-10-CM | POA: Diagnosis not present

## 2024-01-16 DIAGNOSIS — F3341 Major depressive disorder, recurrent, in partial remission: Secondary | ICD-10-CM

## 2024-01-16 DIAGNOSIS — F411 Generalized anxiety disorder: Secondary | ICD-10-CM

## 2024-01-16 DIAGNOSIS — D509 Iron deficiency anemia, unspecified: Secondary | ICD-10-CM | POA: Diagnosis not present

## 2024-01-16 DIAGNOSIS — E66811 Obesity, class 1: Secondary | ICD-10-CM | POA: Diagnosis not present

## 2024-01-16 DIAGNOSIS — Z0000 Encounter for general adult medical examination without abnormal findings: Secondary | ICD-10-CM | POA: Diagnosis not present

## 2024-01-16 DIAGNOSIS — R7309 Other abnormal glucose: Secondary | ICD-10-CM

## 2024-01-16 DIAGNOSIS — G2581 Restless legs syndrome: Secondary | ICD-10-CM

## 2024-01-16 DIAGNOSIS — G5603 Carpal tunnel syndrome, bilateral upper limbs: Secondary | ICD-10-CM | POA: Diagnosis not present

## 2024-01-16 NOTE — Progress Notes (Unsigned)
 Subjective:    Patient ID: Daryl Shelton, male    DOB: Jun 01, 1961, 63 y.o.   MRN: [id number]  Daryl Shelton is a 63 y.o. male presenting on 01/16/2024 for Annual Exam   HPI  Discussed the use of AI scribe software for clinical note transcription with the patient, who gave verbal consent to proceed.  History of Present Illness    Elevated PSA Followed by [facility] Dr Ace Holder Last PSA 3.8, they are going to monitor and pursue Prostate MRI as indicated   CHRONIC HTN: Reports no concerns Current Meds - Lisinopril  40mg  daily, HCTZ 25mg  daily   Reports good compliance, took meds today. Tolerating well, w/o complaints. Denies CP, dyspnea, HA, edema, dizziness / lightheadedness   Obesity BMI >34 Previously trial on Contrave , since GLP1 not covered. He did trial of Saxenda but not available. Weight gain still after treatment on Contrave  Also tried Phentermine  37.5mg  without significant appetite suppression  Failed regular diet exercise regimen   Lumbar DDD S/p Spinal surgery Lumbar Laminectomy 02/16/21 Dr Dawley He had complications with numbness from waist down, has improved. - He also has some cervical spine DDD   Alcohol Abuse history Neuropathy + RLS Abstained now from alcohol since 09/22/20, he was drinking daily for 30 years Prior history  R worse than Left. Both burning and also has RLS. 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 taking Ropinirole  earlier in mid afternoon, current dose 1mg  daily works well. Instead of taking at bedtime.   Reduced Gabapentin  from 900 THREE TIMES A DAY down to 300mg  THREE TIMES A DAY, he has been reducing and breaking tablet. He would like to taper off, he has lost some weight off Gabapentin .   ED On 20mg  Tadalafil  Cialis  regularly   Major Depression recurrent Generalized Anxiety Hx Slight Agoraphobia History of childhood trauma, contributed  Off Sertraline , Remeron  On mild mood anxiety He has practiced  Mindfulness  Now interested in referral to Psychiatry   HIV, chronic, controlled and undetectable Followed by Dr Francee Inch ([facility]) On Biktarvy    HYPERLIPIDEMIA: - Reports no concerns. Last lipid panel 12/2022, normal, without statin > 9 months was on atorvastatin  20mg  Not on therapy Lifestyle - Diet: improved plant based diet   physical activity, some exercise, 2.7 miles avg, avg 6-10 miles per month pedometer   RLS ***does start in evening Taking Ropinirole   ***Neuropathy + RLS  ***Dozing off Not interested in CPAP  ***Carpal Tunnel Bilateral, R > L R hand dominant   Epworth Sleepiness Scale Total Score: *** Sitting and reading - *** Watching TV - *** Sitting inactive in a public place - *** As a passenger in a car for an hour without a break - *** Lying down to rest in the afternoon when circumstances permit - *** Sitting and talking to someone - *** Sitting quietly after a lunch without alcohol - *** In a car, while stopped for a few minutes in traffic - ***  Chance of dozing off under normal circumstances 0 = Never 1 = Slight chance 2 = Moderate chance 3 = High chance  0-7: It is unlikely that you are abnormally sleepy. 8-9: You have an average amount of daytime sleepiness. >9: POSITIVE - Recommend further evaluation, sleep specialist or sleep study (>16-24 = severe)    Health Maintenance:  Cologuard negative 01/14/22, repeat 3 years, 01/2025  Prevnar-20 updated 04/2022.  Shingrix updated.      07/18/2023    9:21 AM 05/09/2023  11:02 AM 01/04/2023    9:22 AM  Depression screen PHQ 2/9  Decreased Interest 0 0 0  Down, Depressed, Hopeless 1 1 1   PHQ - 2 Score 1 1 1   Altered sleeping 1    Tired, decreased energy 1    Change in appetite 1    Feeling bad or failure about yourself  1    Trouble concentrating 0    Moving slowly or fidgety/restless 0    Suicidal thoughts 0    PHQ-9 Score 5         07/18/2023    9:21 AM 01/04/2023    9:22 AM  10/06/2022   10:03 AM 12/31/2021    2:49 PM  GAD 7 : Generalized Anxiety Score  Nervous, Anxious, on Edge 1 1 1 1   Control/stop worrying 1 1 0 0  Worry too much - different things 1 1 0 1  Trouble relaxing 1 1 1  0  Restless 1 0 0 0  Easily annoyed or irritable 1 1 1  0  Afraid - awful might happen 1 1 1 1   Total GAD 7 Score 7 6 4 3   Anxiety Difficulty   Not difficult at all Not difficult at all     Past Medical History:  Diagnosis Date   Alcoholism (HCC)    Anxiety    Arthritis    Bell's palsy    Depression    HIV (human immunodeficiency virus infection) (HCC)    Hypertension    Past Surgical History:  Procedure Laterality Date   LUMBAR LAMINECTOMY/DECOMPRESSION MICRODISCECTOMY N/A 02/16/2021   Procedure: OPEN LUMBAR LAMINECTOMY, LUMBAR TWO-THREE, LUMBAR THREE-FOUR, LUMBAR FOUR-FIVE,LUMBAR FIVE-SACRAL ONE;  Surgeon: Pincus Bridgeman, DO;  Location: [facility] OR;  Service: Neurosurgery;  Laterality: N/A;   Social History   Socioeconomic History   Marital status: Married    Spouse name: Not on file   Number of children: Not on file   Years of education: Not on file   Highest education level: GED or equivalent  Occupational History   Not on file  Tobacco Use   Smoking status: Every Day    Current packs/day: 0.50    Average packs/day: 0.5 packs/day for 50.0 years (25.0 ttl pk-yrs)    Types: Cigarettes   Smokeless tobacco: Never  Vaping Use   Vaping status: Never Used  Substance and Sexual Activity   Alcohol use: Not Currently   Drug use: No   Sexual activity: Yes  Other Topics Concern   Not on file  Social History Narrative   Not on file   Social Drivers of Health   Financial Resource Strain: Medium Risk (01/11/2024)   Overall Financial Resource Strain (CARDIA)    Difficulty of Paying Living Expenses: Somewhat hard  Food Insecurity: No Food Insecurity (01/11/2024)   Hunger Vital Sign    Worried About Running Out of Food in the Last Year: Never true    Ran Out of Food in  the Last Year: Never true  Transportation Needs: No Transportation Needs (01/11/2024)   PRAPARE - Administrator, Civil Service (Medical): No    Lack of Transportation (Non-Medical): No  Physical Activity: Sufficiently Active (01/11/2024)   Exercise Vital Sign    Days of Exercise per Week: 5 days    Minutes of Exercise per Session: 60 min  Stress: Stress Concern Present (01/11/2024)   [employer] of Occupational Health - Occupational Stress Questionnaire    Feeling of Stress : To some extent  Social Connections:  Moderately Isolated (01/11/2024)   Social Connection and Isolation Panel [NHANES]    Frequency of Communication with Friends and Family: Twice a week    Frequency of Social Gatherings with Friends and Family: Once a week    Attends Religious Services: Never    Database administrator or Organizations: No    Attends Engineer, structural: Not on file    Marital Status: Married  Catering manager Violence: Not on file   Family History  Problem Relation Age of Onset   CAD Father    Stomach cancer Sister    Prostate cancer Neg Hx    Current Outpatient Medications on File Prior to Visit  Medication Sig   atorvastatin  (LIPITOR) 20 MG tablet Take 1 tablet (20 mg total) by mouth daily.   bictegravir-emtricitabine -tenofovir  AF (BIKTARVY ) 50-200-25 MG TABS tablet Take 1 tablet by mouth daily.   diphenhydrAMINE (BENADRYL) 25 MG tablet Take 25 mg by mouth daily at 6 (six) AM.   DULoxetine  (CYMBALTA ) 60 MG capsule Take 1 capsule (60 mg total) by mouth daily.   gabapentin  (NEURONTIN ) 300 MG capsule Take 1 capsule (300 mg total) by mouth 3 (three) times daily.   hydrochlorothiazide  (HYDRODIURIL ) 25 MG tablet Take 1 tablet (25 mg total) by mouth daily.   lisinopril  (ZESTRIL ) 40 MG tablet Take 1 tablet (40 mg total) by mouth daily.   Multiple Vitamins-Minerals (MULTIVITAMIN ADULTS 50+) TABS Take 1 tablet by mouth daily.   omeprazole (PRILOSEC) 20 MG capsule Take 20 mg by  mouth daily.   tadalafil  (CIALIS ) 20 MG tablet Take 1 tablet (20 mg total) by mouth daily as needed for erectile dysfunction.   rOPINIRole  (REQUIP ) 1 MG tablet Take 1 in early afternoon and 1 at bedtime.   No current facility-administered medications on file prior to visit.    Review of Systems  Constitutional:  Negative for activity change, appetite change, chills, diaphoresis, fatigue and fever.  HENT:  Positive for hearing loss. Negative for congestion.   Eyes:  Negative for visual disturbance.  Respiratory:  Negative for cough, chest tightness, shortness of breath and wheezing.   Cardiovascular:  Negative for chest pain, palpitations and leg swelling.  Gastrointestinal:  Negative for abdominal pain, constipation, diarrhea, nausea and vomiting.  Genitourinary:  Negative for dysuria, frequency and hematuria.  Musculoskeletal:  Negative for arthralgias and neck pain.  Skin:  Negative for rash.  Neurological:  Negative for dizziness, weakness, light-headedness, numbness and headaches.  Hematological:  Negative for adenopathy.  Psychiatric/Behavioral:  Negative for behavioral problems, dysphoric mood and sleep disturbance.    Per HPI unless specifically indicated above     Objective:     BP 118/68 (BP Location: Left Arm, Patient Position: Sitting, Cuff Size: Normal)   Pulse 94   Ht 5\' 9"  (1.753 m)   Wt 214 lb 4 oz (97.2 kg)   SpO2 94%   BMI 31.64 kg/m   Wt Readings from Last 3 Encounters:  01/16/24 214 lb 4 oz (97.2 kg)  07/18/23 211 lb (95.7 kg)  05/09/23 206 lb (93.4 kg)    Physical Exam Vitals and nursing note reviewed.  Constitutional:      General: He is not in acute distress.    Appearance: He is well-developed. He is not diaphoretic.     Comments: Well-appearing, comfortable, cooperative  HENT:     Head: Normocephalic and atraumatic.     Right Ear: There is impacted cerumen.     Left Ear: There is impacted cerumen.  Eyes:     General:        Right eye: No  discharge.        Left eye: No discharge.     Conjunctiva/sclera: Conjunctivae normal.     Pupils: Pupils are equal, round, and reactive to light.  Neck:     Thyroid : No thyromegaly.     Vascular: No carotid bruit.  Cardiovascular:     Rate and Rhythm: Normal rate and regular rhythm.     Pulses: Normal pulses.     Heart sounds: Normal heart sounds. No murmur heard. Pulmonary:     Effort: Pulmonary effort is normal. No respiratory distress.     Breath sounds: Normal breath sounds. No wheezing or rales.  Abdominal:     General: Bowel sounds are normal. There is no distension.     Palpations: Abdomen is soft. There is no mass.     Tenderness: There is no abdominal tenderness.  Musculoskeletal:        General: No tenderness. Normal range of motion.     Cervical back: Normal range of motion and neck supple.     Right lower leg: No edema.     Left lower leg: No edema.     Comments: Upper / Lower Extremities: - Normal muscle tone, strength bilateral upper extremities 5/5, lower extremities 5/5  Lymphadenopathy:     Cervical: No cervical adenopathy.  Skin:    General: Skin is warm and dry.     Findings: No erythema or rash.  Neurological:     Mental Status: He is alert and oriented to person, place, and time.     Comments: Distal sensation intact to light touch all extremities  Psychiatric:        Mood and Affect: Mood normal.        Behavior: Behavior normal.        Thought Content: Thought content normal.     Comments: Well groomed, good eye contact, normal speech and thoughts     Results for orders placed or performed during the hospital encounter of 11/01/23  [facility] Molecular Screen- Blood ([facility])   Collection Time: 11/01/23  2:40 PM  Result Value Ref Range   Genetic Analysis Overall Interpretation Negative    Genetic Disease Assessed      Helix Tier One Population Screen is a screening test that analyzes 11 genes related to hereditary breast and ovarian  cancer (HBOC) syndrome, Lynch syndrome, and familial hypercholesterolemia. This test only reports clinically significant pathogenic and  likely pathogenic variants but does not report variants of uncertain significance (VUS). In addition, analysis of the PMS2 gene excludes exons 11-15, which overlap with a known pseudogene (PMS2CL).    Genetic Analysis Report      No pathogenic or likely pathogenic variants were detected in the genes analyzed by this test.Genetic test results should be interpreted in the context of an individual's personal medical and family history. Alteration to medical management is NOT  recommended based solely on this result. Clinical correlation is advised.Additional Considerations- This is a screening test; individuals may still carry pathogenic or likely pathogenic variant(s) in the tested genes that are not detected by this test.-  For individuals at risk for these or other related conditions based on factors including personal or family history, diagnostic testing is recommended.- The absence of pathogenic or likely pathogenic variant(s) in the analyzed genes, while reassuring,  does not eliminate the possibility of a hereditary condition; there are other variants and  genes associated with heart disease and hereditary cancer that are not included in this test.    Genes Tested See Notes    Disclaimer See Notes    Sequencing Location See Notes    Interpretation Methods and Limitations See Notes       Assessment & Plan:   Problem List Items Addressed This Visit     Currently asymptomatic HIV infection, with history of HIV-related illness (HCC)   Essential hypertension   Relevant Orders   CT CARDIAC SCORING (SELF PAY ONLY)   GAD (generalized anxiety disorder)   Major depressive disorder, recurrent, in partial remission (HCC)   Other Visit Diagnoses       Annual physical exam    -  Primary     Restless leg syndrome       Relevant Medications   rOPINIRole  (REQUIP )  1 MG tablet        Updated Health Maintenance information Fasting labs done today Encouraged improvement to lifestyle with diet and exercise Goal of weight loss  Assessment and Plan Assessment & Plan    ***   Orders Placed This Encounter  Procedures   CT CARDIAC SCORING (SELF PAY ONLY)    Standing Status:   Future    Expiration Date:   01/15/2025    Preferred imaging location?:   [facility]    No orders of the defined types were placed in this encounter.    Follow up plan: No follow-ups on file.  Domingo Friend, DO [facility] 01/16/2024, 9:52 AM

## 2024-01-16 NOTE — Patient Instructions (Addendum)
 Thank you for coming to the office today.  Try peroxide for ear flushing. We can flush ears in future if need  For the RLS - dose increase Ropinirole , take 1mg  in afternoon and 1 before bed If this works, message me back and I can order more pills quantity, if you prefer to stack the dose, I can order all in one pill, 2mg   Try the Nervive supplement / Alpha Lipoic Acid 600mg  3 times a day  --------------------------  Next LDCT Lung screen should be end of August 2025, if not scheduled, contact us  back and we can help. - If you do the heart version, we can SKIP or PAUSE or DELAY the Lung version  You have been referred for a Coronary Calcium  Score Cardiac CT Scan. This is a screening test for patients aged 48-50+ with cardiovascular risk factors or who are healthy but would be interested in Cardiovascular Screening for heart disease. Even if there is a family history of heart disease, this imaging can be useful. Typically it can be done every 5+ years or at a different timeline we agree on  The scan will look at the chest and mainly focus on the heart and identify early signs of calcium  build up or blockages within the heart arteries. It is not 100% accurate for identifying blockages or heart disease, but it is useful to help us  predict who may have some early changes or be at risk in the future for a heart attack or cardiovascular problem.  The results are reviewed by a Cardiologist and they will document the results. It should become available on MyChart. Typically the results are divided into percentiles based on other patients of the same demographic and age. So it will compare your risk to others similar to you. If you have a higher score >99 or higher percentile >75%tile, it is recommended to consider Statin cholesterol therapy and or referral to Cardiologist. I will try to help explain your results and if we have questions we can contact the Cardiologist.  You will be contacted for  scheduling. Usually it is done at any imaging facility through [facility], [facility] or [facility].  The cost is $99 flat fee total and it does not go through insurance, so no authorization is required.   Please schedule a Follow-up Appointment to: Return in about 6 months (around 07/17/2024) for 6 month follow-up.  If you have any other questions or concerns, please feel free to call the office or send a message through MyChart. You may also schedule an earlier appointment if necessary.  Additionally, you may be receiving a survey about your experience at our office within a few days to 1 week by e-mail or mail. We value your feedback.  Domingo Friend, DO [facility], [place]

## 2024-01-17 ENCOUNTER — Ambulatory Visit: Payer: Self-pay | Admitting: Family Medicine

## 2024-01-17 LAB — COMPLETE METABOLIC PANEL WITHOUT GFR
AG Ratio: 1.7 (calc) (ref 1.0–2.5)
ALT: 24 U/L (ref 9–46)
AST: 29 U/L (ref 10–35)
Albumin: 4.8 g/dL (ref 3.6–5.1)
Alkaline phosphatase (APISO): 66 U/L (ref 35–144)
BUN/Creatinine Ratio: 19 (calc) (ref 6–22)
BUN: 34 mg/dL — ABNORMAL HIGH (ref 7–25)
CO2: 28 mmol/L (ref 20–32)
Calcium: 9.5 mg/dL (ref 8.6–10.3)
Chloride: 100 mmol/L (ref 98–110)
Creat: 1.81 mg/dL — ABNORMAL HIGH (ref 0.70–1.35)
Globulin: 2.9 g/dL (ref 1.9–3.7)
Glucose, Bld: 91 mg/dL (ref 65–99)
Potassium: 4.5 mmol/L (ref 3.5–5.3)
Sodium: 137 mmol/L (ref 135–146)
Total Bilirubin: 0.8 mg/dL (ref 0.2–1.2)
Total Protein: 7.7 g/dL (ref 6.1–8.1)

## 2024-01-17 LAB — LIPID PANEL
Cholesterol: 158 mg/dL (ref <200)
HDL: 54 mg/dL (ref >=40)
LDL Cholesterol (Calc): 67 mg/dL
Non-HDL Cholesterol (Calc): 104 mg/dL (ref <130)
Total CHOL/HDL Ratio: 2.9 (calc) (ref <5.0)
Triglycerides: 303 mg/dL — ABNORMAL HIGH (ref <150)

## 2024-01-17 LAB — CBC WITH DIFFERENTIAL/PLATELET
Absolute Lymphocytes: 1887 {cells}/uL (ref 850–3900)
Absolute Monocytes: 449 {cells}/uL (ref 200–950)
Basophils Absolute: 41 {cells}/uL (ref 0–200)
Basophils Relative: 0.8 %
Eosinophils Absolute: 128 {cells}/uL (ref 15–500)
Eosinophils Relative: 2.5 %
HCT: 42.4 % (ref 38.5–50.0)
Hemoglobin: 13.6 g/dL (ref 13.2–17.1)
MCH: 30.8 pg (ref 27.0–33.0)
MCHC: 32.1 g/dL (ref 32.0–36.0)
MCV: 95.9 fL (ref 80.0–100.0)
MPV: 10 fL (ref 7.5–12.5)
Monocytes Relative: 8.8 %
Neutro Abs: 2596 {cells}/uL (ref 1500–7800)
Neutrophils Relative %: 50.9 %
Platelets: 201 10*3/uL (ref 140–400)
RBC: 4.42 10*6/uL (ref 4.20–5.80)
RDW: 12.6 % (ref 11.0–15.0)
Total Lymphocyte: 37 %
WBC: 5.1 10*3/uL (ref 3.8–10.8)

## 2024-01-17 LAB — FERRITIN: Ferritin: 66 ng/mL (ref 24–380)

## 2024-01-17 LAB — TSH: TSH: 0.77 m[IU]/L (ref 0.40–4.50)

## 2024-01-17 LAB — HEMOGLOBIN A1C
Hgb A1c MFr Bld: 5.4 % (ref <5.7)
Mean Plasma Glucose: 108 mg/dL
eAG (mmol/L): 6 mmol/L

## 2024-01-18 ENCOUNTER — Encounter: Payer: Self-pay | Admitting: Family Medicine

## 2024-02-05 ENCOUNTER — Ambulatory Visit: Payer: Self-pay

## 2024-02-05 ENCOUNTER — Other Ambulatory Visit: Payer: Self-pay | Admitting: Family Medicine

## 2024-02-05 DIAGNOSIS — G2581 Restless legs syndrome: Secondary | ICD-10-CM

## 2024-02-05 MED ORDER — ROPINIROLE HCL 1 MG PO TABS: ORAL_TABLET | ORAL | 1 refills | Status: DC

## 2024-02-05 NOTE — Telephone Encounter (Signed)
 Refill sent to [facility]  Marsa Officer, DO [facility] 02/05/2024, 4:57 PM

## 2024-02-05 NOTE — Telephone Encounter (Signed)
 Spoke to patient and pharmacy.  Ropinirole  ( Requip ) prescription on pharmacy file is incorrect ( noting 1mg  daily).  Patient is now taking 1mg  in early afternoon and 1mg  at bedtime, per MD Karamelegos note 01/16/24  Patient is requesting a new prescription to be sent appropriately to his  [facility] pharmacy.   Message routed.      Copied from CRM [id number]. Topic: Clinical - Prescription Issue >> Feb 05, 2024  3:43 PM Daryl Shelton wrote: Reason for CRM: Pt stated prescription for rOPINIRole  (REQUIP ) 1 MG tablet should read twice a day 2 mg per day. Please confirm with pt at [phone].

## 2024-02-09 ENCOUNTER — Emergency Department

## 2024-02-09 ENCOUNTER — Emergency Department
Admission: EM | Admit: 2024-02-09 | Discharge: 2024-02-09 | Disposition: A | Discharge status: Home / Self Care | Attending: Emergency Medicine | Admitting: Emergency Medicine

## 2024-02-09 ENCOUNTER — Other Ambulatory Visit: Payer: Self-pay

## 2024-02-09 DIAGNOSIS — K838 Other specified diseases of biliary tract: Secondary | ICD-10-CM | POA: Diagnosis not present

## 2024-02-09 DIAGNOSIS — K828 Other specified diseases of gallbladder: Secondary | ICD-10-CM | POA: Diagnosis not present

## 2024-02-09 DIAGNOSIS — K8050 Calculus of bile duct without cholangitis or cholecystitis without obstruction: Secondary | ICD-10-CM

## 2024-02-09 DIAGNOSIS — K8020 Calculus of gallbladder without cholecystitis without obstruction: Secondary | ICD-10-CM

## 2024-02-09 DIAGNOSIS — R1084 Generalized abdominal pain: Secondary | ICD-10-CM | POA: Diagnosis not present

## 2024-02-09 DIAGNOSIS — R1011 Right upper quadrant pain: Secondary | ICD-10-CM | POA: Diagnosis not present

## 2024-02-09 DIAGNOSIS — I959 Hypotension, unspecified: Secondary | ICD-10-CM | POA: Diagnosis not present

## 2024-02-09 DIAGNOSIS — K8070 Calculus of gallbladder and bile duct without cholecystitis without obstruction: Secondary | ICD-10-CM | POA: Insufficient documentation

## 2024-02-09 DIAGNOSIS — R1010 Upper abdominal pain, unspecified: Secondary | ICD-10-CM | POA: Diagnosis not present

## 2024-02-09 DIAGNOSIS — I10 Essential (primary) hypertension: Secondary | ICD-10-CM | POA: Diagnosis not present

## 2024-02-09 LAB — COMPREHENSIVE METABOLIC PANEL WITH GFR
ALT: 72 U/L — ABNORMAL HIGH (ref 0–44)
AST: 183 U/L — ABNORMAL HIGH (ref 15–41)
Albumin: 3.5 g/dL (ref 3.5–5.0)
Alkaline Phosphatase: 79 U/L (ref 38–126)
Anion gap: 12 (ref 5–15)
BUN: 26 mg/dL — ABNORMAL HIGH (ref 8–23)
CO2: 22 mmol/L (ref 22–32)
Calcium: 8.5 mg/dL — ABNORMAL LOW (ref 8.9–10.3)
Chloride: 105 mmol/L (ref 98–111)
Creatinine, Ser: 1.49 mg/dL — ABNORMAL HIGH (ref 0.61–1.24)
GFR, Estimated: 52 mL/min — ABNORMAL LOW (ref >60)
Glucose, Bld: 126 mg/dL — ABNORMAL HIGH (ref 70–99)
Potassium: 3.6 mmol/L (ref 3.5–5.1)
Sodium: 139 mmol/L (ref 135–145)
Total Bilirubin: 0.8 mg/dL (ref 0.0–1.2)
Total Protein: 6.8 g/dL (ref 6.5–8.1)

## 2024-02-09 LAB — CBC
HCT: 34.9 % — ABNORMAL LOW (ref 39.0–52.0)
Hemoglobin: 11.2 g/dL — ABNORMAL LOW (ref 13.0–17.0)
MCH: 31.5 pg (ref 26.0–34.0)
MCHC: 32.1 g/dL (ref 30.0–36.0)
MCV: 98.3 fL (ref 80.0–100.0)
Platelets: 160 K/uL (ref 150–400)
RBC: 3.55 MIL/uL — ABNORMAL LOW (ref 4.22–5.81)
RDW: 12.8 % (ref 11.5–15.5)
WBC: 7.9 K/uL (ref 4.0–10.5)
nRBC: 0 % (ref 0.0–0.2)

## 2024-02-09 LAB — LIPASE, BLOOD: Lipase: 43 U/L (ref 11–51)

## 2024-02-09 MED ORDER — MORPHINE SULFATE (PF) 4 MG/ML IV SOLN
4.0000 mg | Freq: Once | INTRAVENOUS | Status: AC
  Administered 2024-02-09: 4 mg via INTRAVENOUS
  Filled 2024-02-09: qty 1

## 2024-02-09 MED ORDER — KETOROLAC TROMETHAMINE 30 MG/ML IJ SOLN
15.0000 mg | Freq: Once | INTRAMUSCULAR | Status: AC
  Administered 2024-02-09: 15 mg via INTRAVENOUS
  Filled 2024-02-09: qty 1

## 2024-02-09 MED ORDER — ONDANSETRON HCL 4 MG/2ML IJ SOLN
4.0000 mg | INTRAMUSCULAR | Status: AC
  Administered 2024-02-09: 4 mg via INTRAVENOUS
  Filled 2024-02-09: qty 2

## 2024-02-09 NOTE — ED Triage Notes (Signed)
 Pt presents via EMS c/o RUQ abd pain. EMS reports initial BP in 80s systolic. Given 1L NS by EMS.   BP improved to 97/59 after IVF.   18 G left FA by EMS.

## 2024-02-09 NOTE — ED Provider Notes (Signed)
 [facility] Provider Note    Event Date/Time   First MD Initiated Contact with Patient 02/09/24 [phone]     (approximate)   History   Abdominal Pain   HPI Daryl Shelton is a 63 y.o. male who presents for evaluation of acute onset and severe pain in the right upper quadrant that radiates over to the left side of his chest.  It started when he was almost asleep within the last hour or so.  He last ate about 5 hours prior and he had carton he does.  It feels like a burning and dull pain but it is very severe and he cannot find a position of comfort.  No alcohol.  The pain somewhat takes away his breath but otherwise no shortness of breath.  No lower abdominal pain.  No history of similar pain.  No history of gallbladder disease or surgery.  No pain when he urinates.     Physical Exam   Triage Vital Signs: ED Triage Vitals  Encounter Vitals Group     BP 02/09/24 0257 (!) 95/58     Girls Systolic BP Percentile --      Girls Diastolic BP Percentile --      Boys Systolic BP Percentile --      Boys Diastolic BP Percentile --      Pulse Rate 02/09/24 0257 73     Resp 02/09/24 0257 18     Temp 02/09/24 0257 97.7 F (36.5 C)     Temp Source 02/09/24 0257 Oral     SpO2 02/09/24 0257 95 %     Weight --      Height --      Head Circumference --      Peak Flow --      Pain Score 02/09/24 0254 9     Pain Loc --      Pain Education --      Exclude from Growth Chart --     Most recent vital signs: Vitals:   02/09/24 0330 02/09/24 0400  BP: 101/70 104/68  Pulse: 76 78  Resp: 17 16  Temp:    SpO2: 94% 96%    General: Awake, appears very uncomfortable and is moaning in distress. CV:  Good peripheral perfusion.  Regular rate and rhythm. Resp:  Normal effort. Speaking easily and comfortably, no accessory muscle usage nor intercostal retractions.   Abd:  Obese with some chronic distention.  No lower abdominal tenderness or guarding.  Tenderness to the  epigastrium and right upper quadrant with equivocal Murphy sign.  No pulsatile abdominal masses   ED Results / Procedures / Treatments   Labs (all labs ordered are listed, but only abnormal results are displayed) Labs Reviewed  COMPREHENSIVE METABOLIC PANEL WITH GFR - Abnormal; Notable for the following components:      Result Value   Glucose, Bld 126 (*)    BUN 26 (*)    Creatinine, Ser 1.49 (*)    Calcium  8.5 (*)    AST 183 (*)    ALT 72 (*)    GFR, Estimated 52 (*)    All other components within normal limits  CBC - Abnormal; Notable for the following components:   RBC 3.55 (*)    Hemoglobin 11.2 (*)    HCT 34.9 (*)    All other components within normal limits  LIPASE, BLOOD  URINALYSIS, ROUTINE W REFLEX MICROSCOPIC     EKG  ED ECG REPORT I, Darleene Dome,  the attending physician, personally viewed and interpreted this ECG.  Date: 02/09/2024 EKG Time: 2:57 AM Rate: 75 Rhythm: normal sinus rhythm QRS Axis: normal Intervals: normal ST/T Wave abnormalities: normal Narrative Interpretation: no evidence of acute ischemia    RADIOLOGY See ED course for details   PROCEDURES:  Critical Care performed: No  Procedures    IMPRESSION / MDM / ASSESSMENT AND PLAN / ED COURSE  I reviewed the triage vital signs and the nursing notes.                              Differential diagnosis includes, but is not limited to, gallbladder disease, pancreatitis, AAS, ACS.  Patient's presentation is most consistent with acute presentation with potential threat to life or bodily function.  Labs/studies ordered: CBC, CMP, urinalysis, lipase, abdominal ultrasound, EKG  Interventions/Medications given:  Medications  morphine  (PF) 4 MG/ML injection 4 mg (4 mg Intravenous Given 02/09/24 0334)  ondansetron  (ZOFRAN ) injection 4 mg (4 mg Intravenous Given 02/09/24 0331)  ketorolac  (TORADOL ) 30 MG/ML injection 15 mg (15 mg Intravenous Given 02/09/24 0333)    (Note:  hospital course  my include additional interventions and/or labs/studies not listed above.)   Strongly suspect gallbladder disease.  Patient's blood pressure was low and I ordered a liter of fluid.  Medication as listed above for pain and nausea.  Ultrasound and labs are pending although the CBC is back and he has no leukocytosis.  Patient agrees with the plan.  No ischemia on EKG     Clinical Course as of 02/09/24 9361  Fri Feb 09, 2024  9666 Comprehensive metabolic panel(!) Mild LFT elevation but normal bilirubin, stable creatinine which has been higher than 1.49 in the past. [CF]  9364 US  ABDOMEN LIMITED RUQ (LIVER/GB) [CF]  9364 US  ABDOMEN LIMITED RUQ (LIVER/GB) I independently viewed and interpreted the patient's ultrasound and he has some sludge and small stones but I do not see any gallbladder wall thickening or pericholecystic fluid.  Radiology confirmed no evidence of cholecystitis. [CF]  [phone] I reassessed the patient and he is in good spirits and said he feels completely better.  He has no tenderness to palpation of the abdomen at all.  He said so am I ready to go home?.  We talked about his elevated LFTs.  He confirmed that he is a daily drinker, and that could be the cause of his elevated LFTs.  Given how much better he feels I think he is very appropriate for discharge and outpatient follow-up.  He agrees with the plan.  I encouraged outpatient follow-up with surgery and I gave my usual and customary return precautions.  He understands and agrees with the plan. [CF]    Clinical Course User Index [CF] Gordan Huxley, MD     FINAL CLINICAL IMPRESSION(S) / ED DIAGNOSES   Final diagnoses:  Biliary colic  Gallstones     Rx / DC Orders   ED Discharge Orders     None        Note:  This document was prepared using Dragon voice recognition software and may include unintentional dictation errors.   Gordan Huxley, MD 02/09/24 [phone]

## 2024-02-09 NOTE — ED Notes (Signed)
 Discharge instructions reviewed.   Opportunity for questions and concerns provided.   Alert, oriented and ambulatory.   Displays no signs of distress.   Support person providing safe ride home.   Encouraged to follow up with outpatient general surgery.

## 2024-02-09 NOTE — ED Notes (Signed)
 Patient transported to Ultrasound

## 2024-02-09 NOTE — Discharge Instructions (Signed)
You have been seen in the Emergency Department (ED) for abdominal pain.  Your evaluation suggests that your pain is caused by gallstones.  Fortunately you do not need immediate surgery at this time, but it is important that you follow up with a surgeon as an outpatient; typically surgical removal of the gallbladder is the only thing that will definitively fix your issue.  Read through the included information about a bland diet, and use any prescribed medications as instructed.  Avoid smoking and alcohol use.  Please follow up as instructed above regarding today's emergent visit and the symptoms that are bothering you.  Return to the ED if your abdominal pain worsens or fails to improve, you develop bloody vomiting, bloody diarrhea, you are unable to tolerate fluids due to vomiting, fever greater than 101, or other symptoms that concern you. 

## 2024-02-13 DIAGNOSIS — G5603 Carpal tunnel syndrome, bilateral upper limbs: Secondary | ICD-10-CM | POA: Diagnosis not present

## 2024-02-13 DIAGNOSIS — M1811 Unilateral primary osteoarthritis of first carpometacarpal joint, right hand: Secondary | ICD-10-CM | POA: Diagnosis not present

## 2024-02-13 DIAGNOSIS — M25541 Pain in joints of right hand: Secondary | ICD-10-CM | POA: Diagnosis not present

## 2024-02-19 ENCOUNTER — Ambulatory Visit (INDEPENDENT_AMBULATORY_CARE_PROVIDER_SITE_OTHER): Admitting: Surgery

## 2024-02-19 ENCOUNTER — Encounter: Payer: Self-pay | Admitting: Surgery

## 2024-02-19 VITALS — BP 125/78 | HR 89 | Temp 98.4°F | Ht 69.0 in | Wt 213.0 lb

## 2024-02-19 DIAGNOSIS — K8020 Calculus of gallbladder without cholecystitis without obstruction: Secondary | ICD-10-CM | POA: Diagnosis not present

## 2024-02-19 NOTE — H&P (View-Only) (Signed)
 02/19/2024  Reason for Visit: Symptomatic cholelithiasis  History of Present Illness: Daryl Shelton is a 63 y.o. male presenting for evaluation of symptomatic cholelithiasis.  The patient presented to the emergency room on 02/09/2024 with severe pain in the right upper quadrant radiating to the left chest.  Cardiac workup was negative and had an ultrasound of the right upper quadrant which showed sludge and small stones with normal wall thickness of 2.5 mm and common bile duct diameter of 6 mm.  Laboratory workup showed somewhat elevated AST of 183 and ALT of 72 but within normal total bilirubin 0.8 and alkaline phosphatase of 79.  He has some chronic kidney disease with a creatinine of 1.49 and mild anemia with a hemoglobin of 11.2.  The patient symptoms improved while in the emergency room and he was discharged home.  The patient reports that he has had couple of minor episodes of right upper quadrant pain after his visit to the emergency room but nothing that has been severe.  Denies any nausea or vomiting.  Past Medical History: Past Medical History:  Diagnosis Date   Alcoholism (HCC)    Anxiety    Arthritis    Bell's palsy    Depression    HIV (human immunodeficiency virus infection) (HCC)    Hypertension      Past Surgical History: Past Surgical History:  Procedure Laterality Date   LUMBAR LAMINECTOMY/DECOMPRESSION MICRODISCECTOMY N/A 02/16/2021   Procedure: OPEN LUMBAR LAMINECTOMY, LUMBAR TWO-THREE, LUMBAR THREE-FOUR, LUMBAR FOUR-FIVE,LUMBAR FIVE-SACRAL ONE;  Surgeon: Carollee Lani BROCKS, DO;  Location: [facility] OR;  Service: Neurosurgery;  Laterality: N/A;    Home Medications: Prior to Admission medications   Medication Sig Start Date End Date Taking? Authorizing Provider  atorvastatin  (LIPITOR) 20 MG tablet Take 1 tablet (20 mg total) by mouth daily. 08/28/23   Karamalegos, Marsa PARAS, DO  bictegravir-emtricitabine -tenofovir  AF (BIKTARVY ) 50-200-25 MG TABS tablet Take 1 tablet by mouth  daily. 12/19/23   Fayette Bodily, MD  diphenhydrAMINE (BENADRYL) 25 MG tablet Take 25 mg by mouth daily at 6 (six) AM.    [provider]  DULoxetine  (CYMBALTA ) 60 MG capsule Take 1 capsule (60 mg total) by mouth daily. 07/18/23   Karamalegos, Marsa PARAS, DO  gabapentin  (NEURONTIN ) 300 MG capsule Take 1 capsule (300 mg total) by mouth 3 (three) times daily. 05/01/23   Karamalegos, Marsa PARAS, DO  hydrochlorothiazide  (HYDRODIURIL ) 25 MG tablet Take 1 tablet (25 mg total) by mouth daily. 07/18/23   Karamalegos, Marsa PARAS, DO  lisinopril  (ZESTRIL ) 40 MG tablet Take 1 tablet (40 mg total) by mouth daily. 07/18/23   Karamalegos, Marsa PARAS, DO  Multiple Vitamins-Minerals (MULTIVITAMIN ADULTS 50+) TABS Take 1 tablet by mouth daily.    [provider]  omeprazole (PRILOSEC) 20 MG capsule Take 20 mg by mouth daily.    [provider]  rOPINIRole  (REQUIP ) 1 MG tablet Take 1 in early afternoon and 1 at bedtime. 02/05/24   Karamalegos, Marsa PARAS, DO  tadalafil  (CIALIS ) 20 MG tablet Take 1 tablet (20 mg total) by mouth daily as needed for erectile dysfunction. 04/14/23   Edman Marsa PARAS, DO    Allergies: Allergies  Allergen Reactions   Varenicline Other (See Comments)    CNS symptoms - anxiety, agitation     Pollen Extract Other (See Comments)    Congestion     Social History:  reports that he has been smoking cigarettes. He has a 25 pack-year smoking history. He has never used smokeless tobacco. He  reports that he does not currently use alcohol. He reports that he does not use drugs.   Family History: Family History  Problem Relation Age of Onset   CAD Father    Stomach cancer Sister    Prostate cancer Neg Hx     Review of Systems: Review of Systems  Constitutional:  Negative for chills and fever.  HENT:  Negative for hearing loss.   Respiratory:  Negative for shortness of breath.   Cardiovascular:  Negative for chest pain.   Gastrointestinal:  Positive for abdominal pain. Negative for nausea and vomiting.  Genitourinary:  Negative for dysuria.  Musculoskeletal:  Negative for myalgias.  Skin:  Negative for rash.  Neurological:  Negative for dizziness.  Psychiatric/Behavioral:  Negative for depression.     Physical Exam BP 125/78   Pulse 89   Temp 98.4 F (36.9 C) (Oral)   Ht 5' 9 (1.753 m)   Wt 213 lb (96.6 kg)   SpO2 96%   BMI 31.45 kg/m  CONSTITUTIONAL: No acute distress HEENT:  Normocephalic, atraumatic, extraocular motion intact. NECK: Trachea is midline, and there is no jugular venous distension.  RESPIRATORY:  Lungs are clear, and breath sounds are equal bilaterally. Normal respiratory effort without pathologic use of accessory muscles. CARDIOVASCULAR: Heart is regular without murmurs, gallops, or rubs. GI: The abdomen is soft, nondistended, with mild discomfort to palpation in the right upper quadrant.  Negative Murphy sign.  MUSCULOSKELETAL:  Normal muscle strength and tone in all four extremities.  No peripheral edema or cyanosis. SKIN: Skin turgor is normal. There are no pathologic skin lesions.  NEUROLOGIC:  Motor and sensation is grossly normal.  Cranial nerves are grossly intact. PSYCH:  Alert and oriented to person, place and time. Affect is normal.  Laboratory Analysis: Labs from 02/09/2024: Sodium 139, potassium 3.6, chloride 105, CO2 22, BUN 26, creatinine 1.49.  Total bilirubin 0.8, AST 183, ALT 72, alkaline phosphatase 79, albumin  3.5.  WBC 7.9, hemoglobin 11.2, hematocrit 34.9, platelets 160.  Imaging: Ultrasound RUQ on 02/09/2024: IMPRESSION: 1. Distended gallbladder with sludge and layering tiny stones. No sonographic Murphy sign or pericholecystic fluid. 2. Mild coarsening of the hepatic echotexture raises the question of a degree of fatty deposition.  Assessment and Plan: This is a 63 y.o. male with symptomatic cholelithiasis.  - Discussed with the patient the findings on  his ultrasound and laboratory studies while in the emergency room.  He does have cholelithiasis with sludge and small stones.  However there was no abnormal wall thickness or pericholecystic fluid.  His AST and ALT were little bit elevated which could be due to irritation from this episode of biliary colic.  Total bilirubin was normal at 0.8, stable from prior checks in the past.  Discussed with him the function of the gallbladder and how patients can get biliary colic episodes due to the gallstones.  Discussed with patient potential options for conservative measures versus surgical management.  My concern with conservative management is that he still had some flareups of abdominal pain over these last 10 days since his visit to the emergency room.  As such, I would recommend proceeding with surgery and he is in agreement with this. - Reviewed with him then the plan for robotic assisted cholecystectomy and discussed surgery at length with him particularly the planned incisions, risks of bleeding, infection, injury to surrounding structures, the use of ICG to better evaluate the biliary anatomy, that this would be an outpatient procedure, postoperative activity restrictions, pain control,  and he is willing to proceed. - We will schedule the patient for surgery this week on 02/22/2024.  All of his questions have been answered.  I spent 60 minutes dedicated to the care of this patient on the date of this encounter to include pre-visit review of records, face-to-face time with the patient discussing diagnosis and management, and any post-visit coordination of care.   Aloysius Sheree Plant, MD [facility]

## 2024-02-19 NOTE — Progress Notes (Signed)
 02/19/2024  Reason for Visit: Symptomatic cholelithiasis  History of Present Illness: Daryl Shelton is a 63 y.o. male presenting for evaluation of symptomatic cholelithiasis.  The patient presented to the emergency room on 02/09/2024 with severe pain in the right upper quadrant radiating to the left chest.  Cardiac workup was negative and had an ultrasound of the right upper quadrant which showed sludge and small stones with normal wall thickness of 2.5 mm and common bile duct diameter of 6 mm.  Laboratory workup showed somewhat elevated AST of 183 and ALT of 72 but within normal total bilirubin 0.8 and alkaline phosphatase of 79.  He has some chronic kidney disease with a creatinine of 1.49 and mild anemia with a hemoglobin of 11.2.  The patient symptoms improved while in the emergency room and he was discharged home.  The patient reports that he has had couple of minor episodes of right upper quadrant pain after his visit to the emergency room but nothing that has been severe.  Denies any nausea or vomiting.  Past Medical History: Past Medical History:  Diagnosis Date   Alcoholism (HCC)    Anxiety    Arthritis    Bell's palsy    Depression    HIV (human immunodeficiency virus infection) (HCC)    Hypertension      Past Surgical History: Past Surgical History:  Procedure Laterality Date   LUMBAR LAMINECTOMY/DECOMPRESSION MICRODISCECTOMY N/A 02/16/2021   Procedure: OPEN LUMBAR LAMINECTOMY, LUMBAR TWO-THREE, LUMBAR THREE-FOUR, LUMBAR FOUR-FIVE,LUMBAR FIVE-SACRAL ONE;  Surgeon: Carollee Lani BROCKS, DO;  Location: [facility] OR;  Service: Neurosurgery;  Laterality: N/A;    Home Medications: Prior to Admission medications   Medication Sig Start Date End Date Taking? Authorizing Provider  atorvastatin  (LIPITOR) 20 MG tablet Take 1 tablet (20 mg total) by mouth daily. 08/28/23   Karamalegos, Marsa PARAS, DO  bictegravir-emtricitabine -tenofovir  AF (BIKTARVY ) 50-200-25 MG TABS tablet Take 1 tablet by mouth  daily. 12/19/23   Fayette Bodily, MD  diphenhydrAMINE (BENADRYL) 25 MG tablet Take 25 mg by mouth daily at 6 (six) AM.    [provider]  DULoxetine  (CYMBALTA ) 60 MG capsule Take 1 capsule (60 mg total) by mouth daily. 07/18/23   Karamalegos, Marsa PARAS, DO  gabapentin  (NEURONTIN ) 300 MG capsule Take 1 capsule (300 mg total) by mouth 3 (three) times daily. 05/01/23   Karamalegos, Marsa PARAS, DO  hydrochlorothiazide  (HYDRODIURIL ) 25 MG tablet Take 1 tablet (25 mg total) by mouth daily. 07/18/23   Karamalegos, Marsa PARAS, DO  lisinopril  (ZESTRIL ) 40 MG tablet Take 1 tablet (40 mg total) by mouth daily. 07/18/23   Karamalegos, Marsa PARAS, DO  Multiple Vitamins-Minerals (MULTIVITAMIN ADULTS 50+) TABS Take 1 tablet by mouth daily.    [provider]  omeprazole (PRILOSEC) 20 MG capsule Take 20 mg by mouth daily.    [provider]  rOPINIRole  (REQUIP ) 1 MG tablet Take 1 in early afternoon and 1 at bedtime. 02/05/24   Karamalegos, Marsa PARAS, DO  tadalafil  (CIALIS ) 20 MG tablet Take 1 tablet (20 mg total) by mouth daily as needed for erectile dysfunction. 04/14/23   Edman Marsa PARAS, DO    Allergies: Allergies  Allergen Reactions   Varenicline Other (See Comments)    CNS symptoms - anxiety, agitation     Pollen Extract Other (See Comments)    Congestion     Social History:  reports that he has been smoking cigarettes. He has a 25 pack-year smoking history. He has never used smokeless tobacco. He  reports that he does not currently use alcohol. He reports that he does not use drugs.   Family History: Family History  Problem Relation Age of Onset   CAD Father    Stomach cancer Sister    Prostate cancer Neg Hx     Review of Systems: Review of Systems  Constitutional:  Negative for chills and fever.  HENT:  Negative for hearing loss.   Respiratory:  Negative for shortness of breath.   Cardiovascular:  Negative for chest pain.   Gastrointestinal:  Positive for abdominal pain. Negative for nausea and vomiting.  Genitourinary:  Negative for dysuria.  Musculoskeletal:  Negative for myalgias.  Skin:  Negative for rash.  Neurological:  Negative for dizziness.  Psychiatric/Behavioral:  Negative for depression.     Physical Exam BP 125/78   Pulse 89   Temp 98.4 F (36.9 C) (Oral)   Ht 5' 9 (1.753 m)   Wt 213 lb (96.6 kg)   SpO2 96%   BMI 31.45 kg/m  CONSTITUTIONAL: No acute distress HEENT:  Normocephalic, atraumatic, extraocular motion intact. NECK: Trachea is midline, and there is no jugular venous distension.  RESPIRATORY:  Lungs are clear, and breath sounds are equal bilaterally. Normal respiratory effort without pathologic use of accessory muscles. CARDIOVASCULAR: Heart is regular without murmurs, gallops, or rubs. GI: The abdomen is soft, nondistended, with mild discomfort to palpation in the right upper quadrant.  Negative Murphy sign.  MUSCULOSKELETAL:  Normal muscle strength and tone in all four extremities.  No peripheral edema or cyanosis. SKIN: Skin turgor is normal. There are no pathologic skin lesions.  NEUROLOGIC:  Motor and sensation is grossly normal.  Cranial nerves are grossly intact. PSYCH:  Alert and oriented to person, place and time. Affect is normal.  Laboratory Analysis: Labs from 02/09/2024: Sodium 139, potassium 3.6, chloride 105, CO2 22, BUN 26, creatinine 1.49.  Total bilirubin 0.8, AST 183, ALT 72, alkaline phosphatase 79, albumin  3.5.  WBC 7.9, hemoglobin 11.2, hematocrit 34.9, platelets 160.  Imaging: Ultrasound RUQ on 02/09/2024: IMPRESSION: 1. Distended gallbladder with sludge and layering tiny stones. No sonographic Murphy sign or pericholecystic fluid. 2. Mild coarsening of the hepatic echotexture raises the question of a degree of fatty deposition.  Assessment and Plan: This is a 63 y.o. male with symptomatic cholelithiasis.  - Discussed with the patient the findings on  his ultrasound and laboratory studies while in the emergency room.  He does have cholelithiasis with sludge and small stones.  However there was no abnormal wall thickness or pericholecystic fluid.  His AST and ALT were little bit elevated which could be due to irritation from this episode of biliary colic.  Total bilirubin was normal at 0.8, stable from prior checks in the past.  Discussed with him the function of the gallbladder and how patients can get biliary colic episodes due to the gallstones.  Discussed with patient potential options for conservative measures versus surgical management.  My concern with conservative management is that he still had some flareups of abdominal pain over these last 10 days since his visit to the emergency room.  As such, I would recommend proceeding with surgery and he is in agreement with this. - Reviewed with him then the plan for robotic assisted cholecystectomy and discussed surgery at length with him particularly the planned incisions, risks of bleeding, infection, injury to surrounding structures, the use of ICG to better evaluate the biliary anatomy, that this would be an outpatient procedure, postoperative activity restrictions, pain control,  and he is willing to proceed. - We will schedule the patient for surgery this week on 02/22/2024.  All of his questions have been answered.  I spent 60 minutes dedicated to the care of this patient on the date of this encounter to include pre-visit review of records, face-to-face time with the patient discussing diagnosis and management, and any post-visit coordination of care.   Aloysius Sheree Plant, MD [facility]

## 2024-02-19 NOTE — Patient Instructions (Signed)
 You have requested to have your gallbladder removed. This will be done at [facility] with Dr. Aleen Campi.  If you are on any injectable weight loss medication, you will need to stop taking your GLP-1 injectable (weight loss) medications 8 days before your surgery to avoid any complications with anesthesia.   You will most likely be out of work 1-2 weeks for this surgery.  If you have FMLA or disability paperwork that needs filled out you may drop this off at our office or this can be faxed to [phone].  You will return after your post-op appointment with a lifting restriction for approximately 4 more weeks.  You will be able to eat anything you would like to following surgery. But, start by eating a bland diet and advance this as tolerated. The Gallbladder diet is below, please go as closely by this diet as possible prior to surgery to avoid any further attacks.  Please see the (blue)pre-care form that you have been given today. Our surgery scheduler will call you to verify surgery date and to go over information.   If you have any questions, please call our office.  Laparoscopic Cholecystectomy Laparoscopic cholecystectomy is surgery to remove the gallbladder. The gallbladder is located in the upper right part of the abdomen, behind the liver. It is a storage sac for bile, which is produced in the liver. Bile aids in the digestion and absorption of fats. Cholecystectomy is often done for inflammation of the gallbladder (cholecystitis). This condition is usually caused by a buildup of gallstones (cholelithiasis) in the gallbladder. Gallstones can block the flow of bile, and that can result in inflammation and pain. In severe cases, emergency surgery may be required. If emergency surgery is not required, you will have time to prepare for the procedure. Laparoscopic surgery is an alternative to open surgery. Laparoscopic surgery has a shorter recovery time. Your common bile duct may also  need to be examined during the procedure. If stones are found in the common bile duct, they may be removed. LET [facility] CARE PROVIDER KNOW ABOUT: Any allergies you have. All medicines you are taking, including vitamins, herbs, eye drops, creams, and over-the-counter medicines. Previous problems you or members of your family have had with the use of anesthetics. Any blood disorders you have. Previous surgeries you have had.  Any medical conditions you have. RISKS AND COMPLICATIONS Generally, this is a safe procedure. However, problems may occur, including: Infection. Bleeding. Allergic reactions to medicines. Damage to other structures or organs. A stone remaining in the common bile duct. A bile leak from the cyst duct that is clipped when your gallbladder is removed. The need to convert to open surgery, which requires a larger incision in the abdomen. This may be necessary if your surgeon thinks that it is not safe to continue with a laparoscopic procedure. BEFORE THE PROCEDURE Ask your health care provider about: Changing or stopping your regular medicines. This is especially important if you are taking diabetes medicines or blood thinners. Taking medicines such as aspirin and ibuprofen. These medicines can thin your blood. Do not take these medicines before your procedure if your health care provider instructs you not to. Follow instructions from your health care provider about eating or drinking restrictions. Let your health care provider know if you develop a cold or an infection before surgery. Plan to have someone take you home after the procedure. Ask your health care provider how your surgical site will be marked or identified. You may  be given antibiotic medicine to help prevent infection. PROCEDURE To reduce your risk of infection: Your health care team will wash or sanitize their hands. Your skin will be washed with soap. An IV tube may be inserted into one of your  veins. You will be given a medicine to make you fall asleep (general anesthetic). A breathing tube will be placed in your mouth. The surgeon will make several small cuts (incisions) in your abdomen. A thin, lighted tube (laparoscope) that has a tiny camera on the end will be inserted through one of the small incisions. The camera on the laparoscope will send a picture to a TV screen (monitor) in the operating room. This will give the surgeon a good view inside your abdomen. A gas will be pumped into your abdomen. This will expand your abdomen to give the surgeon more room to perform the surgery. Other tools that are needed for the procedure will be inserted through the other incisions. The gallbladder will be removed through one of the incisions. After your gallbladder has been removed, the incisions will be closed with stitches (sutures), staples, or skin glue. Your incisions may be covered with a bandage (dressing). The procedure may vary among health care providers and hospitals. AFTER THE PROCEDURE Your blood pressure, heart rate, breathing rate, and blood oxygen level will be monitored often until the medicines you were given have worn off. You will be given medicines as needed to control your pain.   This information is not intended to replace advice given to you by your health care provider. Make sure you discuss any questions you have with your health care provider.   Document Released: 07/25/2005 Document Revised: 04/15/2015 Document Reviewed: 03/06/2013 Elsevier Interactive Patient Education 2016 [employer]   Low-Fat Diet for Gallbladder Conditions A low-fat diet can be helpful if you have pancreatitis or a gallbladder condition. With these conditions, your pancreas and gallbladder have trouble digesting fats. A healthy eating plan with less fat will help rest your pancreas and gallbladder and reduce your symptoms. WHAT DO I NEED TO KNOW ABOUT THIS DIET? Eat a low-fat  diet. Reduce your fat intake to less than 20-30% of your total daily calories. This is less than 50-60 g of fat per day. Remember that you need some fat in your diet. Ask your dietician what your daily goal should be. Choose nonfat and low-fat healthy foods. Look for the words "nonfat," "low fat," or "fat free." As a guide, look on the label and choose foods with less than 3 g of fat per serving. Eat only one serving. Avoid alcohol. Do not smoke. If you need help quitting, talk with your health care provider. Eat small frequent meals instead of three large heavy meals. WHAT FOODS CAN I EAT? Grains Include healthy grains and starches such as potatoes, wheat bread, fiber-rich cereal, and brown rice. Choose whole grain options whenever possible. In adults, whole grains should account for 45-65% of your daily calories.  Fruits and Vegetables Eat plenty of fruits and vegetables. Fresh fruits and vegetables add fiber to your diet. Meats and Other Protein Sources Eat lean meat such as chicken and pork. Trim any fat off of meat before cooking it. Eggs, fish, and beans are other sources of protein. In adults, these foods should account for 10-35% of your daily calories. Dairy Choose low-fat milk and dairy options. Dairy includes fat and protein, as well as calcium.  Fats and Oils Limit high-fat foods such as fried foods, sweets, baked  goods, sugary drinks.  Other Creamy sauces and condiments, such as mayonnaise, can add extra fat. Think about whether or not you need to use them, or use smaller amounts or low fat options. WHAT FOODS ARE NOT RECOMMENDED? High fat foods, such as: [employer]. Ice cream. [place] toast. Sweet rolls. Pizza. Cheese bread. Foods covered with batter, butter, creamy sauces, or cheese. Fried foods. Sugary drinks and desserts. Foods that cause gas or bloating   This information is not intended to replace advice given to you by your health care provider. Make sure you  discuss any questions you have with your health care provider.   Document Released: 07/30/2013 Document Reviewed: 07/30/2013 Elsevier Interactive Patient Education [employer]

## 2024-02-20 ENCOUNTER — Encounter: Payer: Self-pay | Admitting: Urology

## 2024-02-20 ENCOUNTER — Telehealth: Payer: Self-pay | Admitting: Surgery

## 2024-02-20 NOTE — Telephone Encounter (Signed)
 Patient has been advised of Pre-Admission date/time, and Surgery date at [facility].  Surgery Date: 02/22/24 Preadmission Testing Date: 02/21/24 (phone 1p-4p)  Patient informed of the scheduling process and surgery information given at time of office visit.   Patient has been made aware to call [phone], between 1-3:00pm the day before surgery, to find out what time to arrive for surgery.

## 2024-02-21 ENCOUNTER — Other Ambulatory Visit

## 2024-02-21 ENCOUNTER — Encounter
Admission: RE | Admit: 2024-02-21 | Discharge: 2024-02-21 | Disposition: A | Discharge status: Home / Self Care | Source: Ambulatory Visit | Attending: Surgery | Admitting: Surgery

## 2024-02-21 ENCOUNTER — Other Ambulatory Visit: Payer: Self-pay

## 2024-02-21 HISTORY — DX: Chronic kidney disease, unspecified: N18.9

## 2024-02-21 HISTORY — DX: Pneumonia, unspecified organism: J18.9

## 2024-02-21 HISTORY — DX: Gastro-esophageal reflux disease without esophagitis: K21.9

## 2024-02-21 HISTORY — DX: Calculus of gallbladder without cholecystitis without obstruction: K80.20

## 2024-02-21 HISTORY — DX: Cerebral infarction, unspecified: I63.9

## 2024-02-21 HISTORY — DX: Anemia, unspecified: D64.9

## 2024-02-21 NOTE — Patient Instructions (Addendum)
 Your procedure is scheduled on:02/22/24  Report to the Registration Desk on the 1st floor of the [facility]. To find out your arrival time, please call [phone] between 1PM - 3PM on: 02/21/24 If your arrival time is 6:00 am, do not arrive before that time as the [facility] entrance doors do not open until 6:00 am.  REMEMBER: Instructions that are not followed completely may result in serious medical risk, up to and including death; or upon the discretion of your surgeon and anesthesiologist your surgery may need to be rescheduled.  Do not eat food after midnight the night before surgery.  No gum chewing or hard candies.  You may however, drink CLEAR liquids up to 2 hours before you are scheduled to arrive for your surgery. Do not drink anything within 2 hours of your scheduled arrival time.  Clear liquids include: - water  - apple juice without pulp - gatorade (not RED colors) - black coffee or tea (Do NOT add milk or creamers to the coffee or tea) Do NOT drink anything that is not on this list.  One week prior to surgery: Stop Anti-inflammatories (NSAIDS) such as Advil, Aleve, Ibuprofen, Motrin, Naproxen, Naprosyn and Aspirin  based products such as Excedrin, Goody's Powder, BC Powder. You may however, continue to take Tylenol  if needed for pain up until the day of surgery.  Stop ANY OVER THE COUNTER supplements until after surgery.  HOLD tadalafil  (CIALIS ) beginning 02/21/24.  ON THE DAY OF SURGERY ONLY TAKE THESE MEDICATIONS WITH SIPS OF WATER:  BIKTARVY   DULoxetine  (CYMBALTA )  omeprazole (PRILOSEC)    No Alcohol for 24 hours before or after surgery.  No Smoking including e-cigarettes for 24 hours before surgery.  No chewable tobacco products for at least 6 hours before surgery.  No nicotine  patches on the day of surgery.  Do not use any recreational drugs for at least a week (preferably 2 weeks) before your surgery.  Please be advised that the combination of  cocaine and anesthesia may have negative outcomes, up to and including death. If you test positive for cocaine, your surgery will be cancelled.  On the morning of surgery brush your teeth with toothpaste and water, you may rinse your mouth with mouthwash if you wish. Do not swallow any toothpaste or mouthwash.  Do not wear jewelry, make-up, hairpins, clips or nail polish.  For welded (permanent) jewelry: bracelets, anklets, waist bands, etc.  Please have this removed prior to surgery.  If it is not removed, there is a chance that hospital personnel will need to cut it off on the day of surgery.  Do not wear lotions, powders, or perfumes.   Do not shave body hair from the neck down 48 hours before surgery.  Contact lenses, hearing aids and dentures may not be worn into surgery.  Do not bring valuables to the hospital. [facility] is not responsible for any missing/lost belongings or valuables.   Notify your doctor if there is any change in your medical condition (cold, fever, infection).  Wear comfortable clothing (specific to your surgery type) to the hospital.  After surgery, you can help prevent lung complications by doing breathing exercises.  Take deep breaths and cough every 1-2 hours. Your doctor may order a device called an Incentive Spirometer to help you take deep breaths.  When coughing or sneezing, hold a pillow firmly against your incision with both hands. This is called "splinting." Doing this helps protect your incision. It also decreases belly discomfort.  If you are being admitted to the hospital overnight, leave your suitcase in the car. After surgery it may be brought to your room.  In case of increased patient census, it may be necessary for you, the patient, to continue your postoperative care in the Same Day Surgery department.  If you are being discharged the day of surgery, you will not be allowed to drive home. You will need a responsible individual to drive  you home and stay with you for 24 hours after surgery.   If you are taking public transportation, you will need to have a responsible individual with you.  Please call the Pre-admissions Testing Dept. at [phone] if you have any questions about these instructions.  Surgery Visitation Policy:  Patients having surgery or a procedure may have two visitors.  Children under the age of 32 must have an adult with them who is not the patient.  Inpatient Visitation:    Visiting hours are 7 a.m. to 8 p.m. Up to four visitors are allowed at one time in a patient room. The visitors may rotate out with other people during the day.  One visitor age 71 or older may stay with the patient overnight and must be in the room by 8 p.m.   Merchandiser, retail to address health-related social needs:  [email or web address]

## 2024-02-22 ENCOUNTER — Encounter: Payer: Self-pay | Admitting: Surgery

## 2024-02-22 ENCOUNTER — Ambulatory Visit

## 2024-02-22 ENCOUNTER — Ambulatory Visit
Admission: RE | Admit: 2024-02-22 | Discharge: 2024-02-22 | Disposition: A | Discharge status: Home / Self Care | Attending: Surgery | Admitting: Surgery

## 2024-02-22 ENCOUNTER — Other Ambulatory Visit: Payer: Self-pay

## 2024-02-22 ENCOUNTER — Encounter
Admission: RE | Disposition: A | Discharge status: Home / Self Care | Payer: Self-pay | Source: Home / Self Care | Attending: Surgery

## 2024-02-22 DIAGNOSIS — K8001 Calculus of gallbladder with acute cholecystitis with obstruction: Secondary | ICD-10-CM

## 2024-02-22 DIAGNOSIS — N189 Chronic kidney disease, unspecified: Secondary | ICD-10-CM | POA: Insufficient documentation

## 2024-02-22 DIAGNOSIS — K8020 Calculus of gallbladder without cholecystitis without obstruction: Secondary | ICD-10-CM | POA: Diagnosis not present

## 2024-02-22 DIAGNOSIS — I129 Hypertensive chronic kidney disease with stage 1 through stage 4 chronic kidney disease, or unspecified chronic kidney disease: Secondary | ICD-10-CM | POA: Diagnosis not present

## 2024-02-22 DIAGNOSIS — F419 Anxiety disorder, unspecified: Secondary | ICD-10-CM | POA: Diagnosis not present

## 2024-02-22 DIAGNOSIS — F32A Depression, unspecified: Secondary | ICD-10-CM | POA: Insufficient documentation

## 2024-02-22 DIAGNOSIS — Z79899 Other long term (current) drug therapy: Secondary | ICD-10-CM | POA: Insufficient documentation

## 2024-02-22 DIAGNOSIS — K812 Acute cholecystitis with chronic cholecystitis: Secondary | ICD-10-CM | POA: Diagnosis not present

## 2024-02-22 DIAGNOSIS — D631 Anemia in chronic kidney disease: Secondary | ICD-10-CM | POA: Diagnosis not present

## 2024-02-22 DIAGNOSIS — K429 Umbilical hernia without obstruction or gangrene: Secondary | ICD-10-CM | POA: Diagnosis not present

## 2024-02-22 DIAGNOSIS — K219 Gastro-esophageal reflux disease without esophagitis: Secondary | ICD-10-CM | POA: Diagnosis not present

## 2024-02-22 DIAGNOSIS — Z21 Asymptomatic human immunodeficiency virus [HIV] infection status: Secondary | ICD-10-CM | POA: Insufficient documentation

## 2024-02-22 DIAGNOSIS — F17210 Nicotine dependence, cigarettes, uncomplicated: Secondary | ICD-10-CM | POA: Diagnosis not present

## 2024-02-22 DIAGNOSIS — Z8673 Personal history of transient ischemic attack (TIA), and cerebral infarction without residual deficits: Secondary | ICD-10-CM | POA: Diagnosis not present

## 2024-02-22 DIAGNOSIS — I120 Hypertensive chronic kidney disease with stage 5 chronic kidney disease or end stage renal disease: Secondary | ICD-10-CM | POA: Diagnosis not present

## 2024-02-22 HISTORY — PX: UMBILICAL HERNIA REPAIR: SHX196

## 2024-02-22 SURGERY — CHOLECYSTECTOMY, ROBOT-ASSISTED, LAPAROSCOPIC
Anesthesia: General | Site: Abdomen

## 2024-02-22 MED ORDER — DEXAMETHASONE SODIUM PHOSPHATE 10 MG/ML IJ SOLN
INTRAMUSCULAR | Status: AC
  Filled 2024-02-22: qty 1

## 2024-02-22 MED ORDER — GLYCOPYRROLATE 0.2 MG/ML IJ SOLN
INTRAMUSCULAR | Status: DC | PRN
  Administered 2024-02-22 (×2): .1 mg via INTRAVENOUS

## 2024-02-22 MED ORDER — ONDANSETRON HCL 4 MG/2ML IJ SOLN
INTRAMUSCULAR | Status: AC
  Filled 2024-02-22: qty 2

## 2024-02-22 MED ORDER — PHENYLEPHRINE 80 MCG/ML (10ML) SYRINGE FOR IV PUSH (FOR BLOOD PRESSURE SUPPORT)
PREFILLED_SYRINGE | INTRAVENOUS | Status: AC
  Filled 2024-02-22: qty 10

## 2024-02-22 MED ORDER — PHENYLEPHRINE 80 MCG/ML (10ML) SYRINGE FOR IV PUSH (FOR BLOOD PRESSURE SUPPORT)
PREFILLED_SYRINGE | INTRAVENOUS | Status: DC | PRN
  Administered 2024-02-22 (×2): 160 ug via INTRAVENOUS
  Administered 2024-02-22: 80 ug via INTRAVENOUS

## 2024-02-22 MED ORDER — SUGAMMADEX SODIUM 200 MG/2ML IV SOLN
INTRAVENOUS | Status: DC | PRN
  Administered 2024-02-22: 200 mg via INTRAVENOUS

## 2024-02-22 MED ORDER — GABAPENTIN 300 MG PO CAPS
ORAL_CAPSULE | ORAL | Status: AC
  Filled 2024-02-22: qty 1

## 2024-02-22 MED ORDER — CHLORHEXIDINE GLUCONATE CLOTH 2 % EX PADS
6.0000 | MEDICATED_PAD | Freq: Once | CUTANEOUS | Status: AC
  Administered 2024-02-22: 6 via TOPICAL

## 2024-02-22 MED ORDER — CHLORHEXIDINE GLUCONATE 0.12 % MT SOLN
15.0000 mL | Freq: Once | OROMUCOSAL | Status: AC
  Administered 2024-02-22: 15 mL via OROMUCOSAL

## 2024-02-22 MED ORDER — PHENYLEPHRINE HCL-NACL 20-0.9 MG/250ML-% IV SOLN
INTRAVENOUS | Status: AC
  Filled 2024-02-22: qty 250

## 2024-02-22 MED ORDER — FENTANYL CITRATE (PF) 100 MCG/2ML IJ SOLN
INTRAMUSCULAR | Status: AC
  Filled 2024-02-22: qty 2

## 2024-02-22 MED ORDER — DEXMEDETOMIDINE HCL IN NACL 200 MCG/50ML IV SOLN
INTRAVENOUS | Status: DC | PRN
  Administered 2024-02-22: 8 ug via INTRAVENOUS
  Administered 2024-02-22 (×3): 4 ug via INTRAVENOUS

## 2024-02-22 MED ORDER — PROPOFOL 10 MG/ML IV BOLUS
INTRAVENOUS | Status: DC | PRN
  Administered 2024-02-22: 200 mg via INTRAVENOUS

## 2024-02-22 MED ORDER — ROCURONIUM BROMIDE 100 MG/10ML IV SOLN
INTRAVENOUS | Status: DC | PRN
  Administered 2024-02-22: 60 mg via INTRAVENOUS
  Administered 2024-02-22: 20 mg via INTRAVENOUS

## 2024-02-22 MED ORDER — PHENYLEPHRINE HCL-NACL 20-0.9 MG/250ML-% IV SOLN
INTRAVENOUS | Status: DC | PRN
  Administered 2024-02-22: 80 ug/min via INTRAVENOUS

## 2024-02-22 MED ORDER — CEFAZOLIN SODIUM-DEXTROSE 2-4 GM/100ML-% IV SOLN
INTRAVENOUS | Status: AC
  Filled 2024-02-22: qty 100

## 2024-02-22 MED ORDER — BUPIVACAINE-EPINEPHRINE (PF) 0.25% -1:200000 IJ SOLN
INTRAMUSCULAR | Status: DC | PRN
  Administered 2024-02-22: 40 mL

## 2024-02-22 MED ORDER — ONDANSETRON HCL 4 MG/2ML IJ SOLN
INTRAMUSCULAR | Status: DC | PRN
  Administered 2024-02-22: 4 mg via INTRAVENOUS

## 2024-02-22 MED ORDER — BUPIVACAINE LIPOSOME 1.3 % IJ SUSP
INTRAMUSCULAR | Status: AC
  Filled 2024-02-22: qty 10

## 2024-02-22 MED ORDER — LACTATED RINGERS IV SOLN: INTRAVENOUS | Status: DC

## 2024-02-22 MED ORDER — PROPOFOL 10 MG/ML IV BOLUS
INTRAVENOUS | Status: AC
  Filled 2024-02-22: qty 20

## 2024-02-22 MED ORDER — MIDAZOLAM HCL 2 MG/2ML IJ SOLN
INTRAMUSCULAR | Status: AC
  Filled 2024-02-22: qty 2

## 2024-02-22 MED ORDER — ACETAMINOPHEN 500 MG PO TABS
ORAL_TABLET | ORAL | Status: AC
  Filled 2024-02-22: qty 2

## 2024-02-22 MED ORDER — FENTANYL CITRATE (PF) 100 MCG/2ML IJ SOLN
INTRAMUSCULAR | Status: DC | PRN
  Administered 2024-02-22 (×2): 50 ug via INTRAVENOUS

## 2024-02-22 MED ORDER — 0.9 % SODIUM CHLORIDE (POUR BTL) OPTIME
TOPICAL | Status: DC | PRN
Start: 2024-02-22 — End: 2024-02-22
  Administered 2024-02-22: 500 mL

## 2024-02-22 MED ORDER — SUCCINYLCHOLINE CHLORIDE 200 MG/10ML IV SOSY
PREFILLED_SYRINGE | INTRAVENOUS | Status: AC
  Filled 2024-02-22: qty 10

## 2024-02-22 MED ORDER — OXYCODONE HCL 5 MG PO TABS: 5.0000 mg | ORAL_TABLET | ORAL | 0 refills | Status: DC | PRN

## 2024-02-22 MED ORDER — AMOXICILLIN-POT CLAVULANATE 875-125 MG PO TABS: 1.0000 | ORAL_TABLET | Freq: Two times a day (BID) | ORAL | 0 refills | Status: AC

## 2024-02-22 MED ORDER — CHLORHEXIDINE GLUCONATE 0.12 % MT SOLN
OROMUCOSAL | Status: AC
  Filled 2024-02-22: qty 15

## 2024-02-22 MED ORDER — OXYCODONE HCL 5 MG PO TABS
5.0000 mg | ORAL_TABLET | Freq: Once | ORAL | Status: AC | PRN
  Administered 2024-02-22: 5 mg via ORAL

## 2024-02-22 MED ORDER — KETAMINE HCL 50 MG/5ML IJ SOSY
PREFILLED_SYRINGE | INTRAMUSCULAR | Status: DC | PRN
  Administered 2024-02-22: 20 mg via INTRAVENOUS
  Administered 2024-02-22 (×2): 10 mg via INTRAVENOUS

## 2024-02-22 MED ORDER — LIDOCAINE HCL (PF) 2 % IJ SOLN
INTRAMUSCULAR | Status: AC
  Filled 2024-02-22: qty 5

## 2024-02-22 MED ORDER — EPHEDRINE SULFATE-NACL 50-0.9 MG/10ML-% IV SOSY
PREFILLED_SYRINGE | INTRAVENOUS | Status: DC | PRN
  Administered 2024-02-22: 5 mg via INTRAVENOUS

## 2024-02-22 MED ORDER — GLYCOPYRROLATE 0.2 MG/ML IJ SOLN
INTRAMUSCULAR | Status: AC
  Filled 2024-02-22: qty 1

## 2024-02-22 MED ORDER — PROPOFOL 10 MG/ML IV BOLUS
INTRAVENOUS | Status: AC
  Filled 2024-02-22: qty 40

## 2024-02-22 MED ORDER — EPHEDRINE 5 MG/ML INJ
INTRAVENOUS | Status: AC
  Filled 2024-02-22: qty 5

## 2024-02-22 MED ORDER — OXYCODONE HCL 5 MG/5ML PO SOLN: 5.0000 mg | Freq: Once | ORAL | Status: AC | PRN

## 2024-02-22 MED ORDER — MIDAZOLAM HCL 2 MG/2ML IJ SOLN
INTRAMUSCULAR | Status: DC | PRN
  Administered 2024-02-22: 2 mg via INTRAVENOUS

## 2024-02-22 MED ORDER — GABAPENTIN 300 MG PO CAPS: 300.0000 mg | ORAL_CAPSULE | ORAL | Status: DC

## 2024-02-22 MED ORDER — LIDOCAINE HCL (CARDIAC) PF 100 MG/5ML IV SOSY
PREFILLED_SYRINGE | INTRAVENOUS | Status: DC | PRN
  Administered 2024-02-22: 80 mg via INTRAVENOUS

## 2024-02-22 MED ORDER — ROCURONIUM BROMIDE 10 MG/ML (PF) SYRINGE
PREFILLED_SYRINGE | INTRAVENOUS | Status: AC
  Filled 2024-02-22: qty 10

## 2024-02-22 MED ORDER — SODIUM CHLORIDE FLUSH 0.9 % IV SOLN
INTRAVENOUS | Status: AC
  Filled 2024-02-22: qty 10

## 2024-02-22 MED ORDER — OXYCODONE HCL 5 MG PO TABS
ORAL_TABLET | ORAL | Status: AC
Start: 2024-02-22 — End: 2024-02-22
  Filled 2024-02-22: qty 1

## 2024-02-22 MED ORDER — INDOCYANINE GREEN 25 MG IV SOLR
INTRAVENOUS | Status: AC
  Filled 2024-02-22: qty 10

## 2024-02-22 MED ORDER — DEXAMETHASONE SODIUM PHOSPHATE 10 MG/ML IJ SOLN
INTRAMUSCULAR | Status: DC | PRN
Start: 2024-02-22 — End: 2024-02-22
  Administered 2024-02-22: 10 mg via INTRAVENOUS

## 2024-02-22 MED ORDER — ACETAMINOPHEN 500 MG PO TABS
1000.0000 mg | ORAL_TABLET | ORAL | Status: AC
  Administered 2024-02-22: 1000 mg via ORAL

## 2024-02-22 MED ORDER — INDOCYANINE GREEN 25 MG IV SOLR
1.2500 mg | INTRAVENOUS | Status: AC
  Administered 2024-02-22: 1.25 mg via INTRAVENOUS

## 2024-02-22 MED ORDER — DEXMEDETOMIDINE HCL IN NACL 80 MCG/20ML IV SOLN
INTRAVENOUS | Status: AC
  Filled 2024-02-22: qty 20

## 2024-02-22 MED ORDER — SODIUM CHLORIDE 0.9 % IR SOLN
Status: DC | PRN
  Administered 2024-02-22: 1000 mL

## 2024-02-22 MED ORDER — ORAL CARE MOUTH RINSE: 15.0000 mL | Freq: Once | OROMUCOSAL | Status: AC

## 2024-02-22 MED ORDER — CEFAZOLIN SODIUM-DEXTROSE 2-4 GM/100ML-% IV SOLN
2.0000 g | INTRAVENOUS | Status: AC
  Administered 2024-02-22: 2 g via INTRAVENOUS

## 2024-02-22 MED ORDER — CHLORHEXIDINE GLUCONATE CLOTH 2 % EX PADS: 6.0000 | MEDICATED_PAD | Freq: Once | CUTANEOUS | Status: DC

## 2024-02-22 MED ORDER — KETAMINE HCL 50 MG/5ML IJ SOSY
PREFILLED_SYRINGE | INTRAMUSCULAR | Status: AC
  Filled 2024-02-22: qty 5

## 2024-02-22 MED ORDER — BUPIVACAINE-EPINEPHRINE (PF) 0.25% -1:200000 IJ SOLN
INTRAMUSCULAR | Status: AC
  Filled 2024-02-22: qty 30

## 2024-02-22 MED ORDER — FENTANYL CITRATE (PF) 100 MCG/2ML IJ SOLN
25.0000 ug | INTRAMUSCULAR | Status: DC | PRN
  Administered 2024-02-22 (×2): 25 ug via INTRAVENOUS

## 2024-02-22 MED ORDER — ACETAMINOPHEN 500 MG PO TABS: 1000.0000 mg | ORAL_TABLET | Freq: Four times a day (QID) | ORAL | Status: DC | PRN

## 2024-02-22 MED ORDER — BUPIVACAINE LIPOSOME 1.3 % IJ SUSP: 10.0000 mL | Freq: Once | INTRAMUSCULAR | Status: DC

## 2024-02-22 SURGICAL SUPPLY — 42 items
BAG PRESSURE INF REUSE 1000 (BAG) IMPLANT
CANNULA CAP OBTURATR AIRSEAL 8 (CAP) IMPLANT
CAUTERY HOOK MNPLR 1.6 DVNC XI (INSTRUMENTS) ×2 IMPLANT
CLIP LIGATING HEMO O LOK GREEN (MISCELLANEOUS) ×2 IMPLANT
DEFOGGER SCOPE WARM SEASHARP (MISCELLANEOUS) ×2 IMPLANT
DERMABOND ADVANCED .7 DNX12 (GAUZE/BANDAGES/DRESSINGS) ×2 IMPLANT
DRAIN CHANNEL JP 19F 1/4 (MISCELLANEOUS) IMPLANT
DRAPE ARM DVNC X/XI (DISPOSABLE) ×8 IMPLANT
DRAPE COLUMN DVNC XI (DISPOSABLE) ×2 IMPLANT
DRSG TEGADERM 4X4.75 (GAUZE/BANDAGES/DRESSINGS) IMPLANT
DRSG TEGADERM 6X8 (GAUZE/BANDAGES/DRESSINGS) IMPLANT
ELECTRODE CAUTERY BLDE TIP 2.5 (TIP) ×2 IMPLANT
ELECTRODE REM PT RTRN 9FT ADLT (ELECTROSURGICAL) ×2 IMPLANT
EVACUATOR SILICONE 100CC (DRAIN) IMPLANT
FORCEPS BPLR R/ABLATION 8 DVNC (INSTRUMENTS) ×2 IMPLANT
FORCEPS PROGRASP DVNC XI (FORCEP) ×2 IMPLANT
GLOVE SURG SYN 7.0 PF PI (GLOVE) ×4 IMPLANT
GLOVE SURG SYN 7.5 PF PI (GLOVE) ×4 IMPLANT
GOWN STRL REUS W/ TWL LRG LVL3 (GOWN DISPOSABLE) ×8 IMPLANT
IRRIGATOR SUCT 8 DISP DVNC XI (IRRIGATION / IRRIGATOR) IMPLANT
IV NS 1000ML BAXH (IV SOLUTION) IMPLANT
KIT PINK PAD W/HEAD ARM REST (MISCELLANEOUS) ×2 IMPLANT
LABEL OR SOLS (LABEL) ×2 IMPLANT
MANIFOLD NEPTUNE II (INSTRUMENTS) ×2 IMPLANT
NDL HYPO 22X1.5 SAFETY MO (MISCELLANEOUS) ×2 IMPLANT
NEEDLE HYPO 22X1.5 SAFETY MO (MISCELLANEOUS) ×2 IMPLANT
NS IRRIG 500ML POUR BTL (IV SOLUTION) ×2 IMPLANT
OBTURATOR OPTICALSTD 8 DVNC (TROCAR) ×2 IMPLANT
PACK LAP CHOLECYSTECTOMY (MISCELLANEOUS) ×2 IMPLANT
SEAL UNIV 5-12 XI (MISCELLANEOUS) ×8 IMPLANT
SET TUBE FILTERED XL AIRSEAL (SET/KITS/TRAYS/PACK) IMPLANT
SET TUBE SMOKE EVAC HIGH FLOW (TUBING) ×2 IMPLANT
SOLUTION ELECTROSURG ANTI STCK (MISCELLANEOUS) ×2 IMPLANT
SPIKE FLUID TRANSFER (MISCELLANEOUS) ×2 IMPLANT
SPONGE VERSALON 4X4 4PLY (MISCELLANEOUS) IMPLANT
SUT ETHILON 2 0 FS 18 (SUTURE) IMPLANT
SUT MNCRL AB 4-0 PS2 18 (SUTURE) ×2 IMPLANT
SUT VIC AB 2-0 SH 27XBRD (SUTURE) IMPLANT
SUT VIC AB 3-0 SH 27X BRD (SUTURE) IMPLANT
SUT VICRYL 0 UR6 27IN ABS (SUTURE) ×4 IMPLANT
SYSTEM BAG RETRIEVAL 10MM (BASKET) ×2 IMPLANT
WATER STERILE IRR 500ML POUR (IV SOLUTION) ×2 IMPLANT

## 2024-02-22 NOTE — Discharge Instructions (Signed)
 Discharge Instructions: 1.  Patient may shower, but do not scrub wounds heavily and dab dry only. 2.  Do not submerge wounds in pool/tub until fully healed. 3.  Do not apply ointments or hydrogen peroxide to the wounds. 4.  May apply ice packs to the wounds for comfort. 5.  Please change the drain dressing with 4x4 gauze and tape once daily.  6.  Please empty and record drain output twice daily and make sure to keep the bulb squeezed.  Bring drain record with you to office appointment. 7.  Do not drive while taking narcotics for pain control.  Prior to driving, make sure you are able to rotate right and left to look at blindspots without significant pain or discomfort. 8.  No heavy lifting or pushing of more than 10-15 lbs for 4 weeks.

## 2024-02-22 NOTE — Transfer of Care (Signed)
 Immediate Anesthesia Transfer of Care Note  Patient: Daryl Shelton  Procedure(s) Performed: CHOLECYSTECTOMY, ROBOT-ASSISTED, LAPAROSCOPIC REPAIR, HERNIA, UMBILICAL, ADULT (Abdomen)  Patient Location: PACU  Anesthesia Type:General  Level of Consciousness: drowsy and patient cooperative  Airway & Oxygen Therapy: Patient Spontanous Breathing and Patient connected to face mask oxygen  Post-op Assessment: Report given to RN and Post -op Vital signs reviewed and stable  Post vital signs: Reviewed and stable  Last Vitals:  Vitals Value Taken Time  BP 97/63 02/22/24 10:06  Temp 38f   Pulse 81 02/22/24 10:09  Resp 17 02/22/24 10:09  SpO2 95 % 02/22/24 10:09  Vitals shown include unfiled device data.  Last Pain:  Vitals:   02/22/24 0617  TempSrc: Temporal  PainSc: 0-No pain         Complications: No notable events documented.

## 2024-02-22 NOTE — Anesthesia Preprocedure Evaluation (Signed)
 Anesthesia Evaluation  Patient identified by MRN, date of birth, ID band Patient awake    Reviewed: Allergy & Precautions, NPO status , Patient's Chart, lab work & pertinent test results  History of Anesthesia Complications Negative for: history of anesthetic complications  Airway Mallampati: III  TM Distance: >3 FB Neck ROM: full    Dental  (+) Poor Dentition, Missing   Pulmonary neg pulmonary ROS, Current Smoker   Pulmonary exam normal        Cardiovascular hypertension, On Medications negative cardio ROS Normal cardiovascular exam     Neuro/Psych  PSYCHIATRIC DISORDERS Anxiety Depression    TIA Neuromuscular disease CVA    GI/Hepatic ,GERD  ,,(+)     substance abuse  alcohol use  Endo/Other  negative endocrine ROS    Renal/GU Renal disease     Musculoskeletal   Abdominal   Peds  Hematology  (+) Blood dyscrasia, anemia , HIV  Anesthesia Other Findings Past Medical History: No date: Alcoholism (HCC) No date: Anemia No date: Anxiety No date: Arthritis No date: Bell's palsy No date: Cholelithiasis No date: Chronic kidney disease No date: Depression No date: GERD (gastroesophageal reflux disease) No date: HIV (human immunodeficiency virus infection) (HCC) No date: Hypertension No date: Pneumonia No date: Stroke [facility])     Comment:  TIA  Past Surgical History: No date: COLONOSCOPY W/ POLYPECTOMY 02/16/2021: LUMBAR LAMINECTOMY/DECOMPRESSION MICRODISCECTOMY; N/A     Comment:  Procedure: OPEN LUMBAR LAMINECTOMY, LUMBAR TWO-THREE,               LUMBAR THREE-FOUR, LUMBAR FOUR-FIVE,LUMBAR FIVE-SACRAL               ONE;  Surgeon: Carollee Lani BROCKS, DO;  Location: [facility] OR;                Service: Neurosurgery;  Laterality: N/A;  BMI    Body Mass Index: 31.45 kg/m      Reproductive/Obstetrics negative OB ROS                              Anesthesia Physical Anesthesia Plan  ASA:  3  Anesthesia Plan: General ETT   Post-op Pain Management: Toradol  IV (intra-op)*, Tylenol  PO (pre-op)*, Dilaudid  IV, Ketamine  IV* and Precedex    Induction: Intravenous  PONV Risk Score and Plan: 2 and Ondansetron , Dexamethasone  and Treatment may vary due to age or medical condition  Airway Management Planned: Oral ETT  Additional Equipment:   Intra-op Plan:   Post-operative Plan: Extubation in OR  Informed Consent: I have reviewed the patients History and Physical, chart, labs and discussed the procedure including the risks, benefits and alternatives for the proposed anesthesia with the patient or authorized representative who has indicated his/her understanding and acceptance.     Dental Advisory Given  Plan Discussed with: Anesthesiologist, CRNA and Surgeon  Anesthesia Plan Comments: (Patient consented for risks of anesthesia including but not limited to:  - adverse reactions to medications - damage to eyes, teeth, lips or other oral mucosa - nerve damage due to positioning  - sore throat or hoarseness - Damage to heart, brain, nerves, lungs, other parts of body or loss of life  Patient voiced understanding and assent.)        Anesthesia Quick Evaluation

## 2024-02-22 NOTE — Interval H&P Note (Signed)
 History and Physical Interval Note:  02/22/2024 7:03 AM  Daryl Shelton  has presented today for surgery, with the diagnosis of Symptomatic cholelithiasis.  The various methods of treatment have been discussed with the patient and family. After consideration of risks, benefits and other options for treatment, the patient has consented to  Procedure(s) with comments: CHOLECYSTECTOMY, ROBOT-ASSISTED, LAPAROSCOPIC (N/A) - w/ICG as a surgical intervention.  The patient's history has been reviewed, patient examined, no change in status, stable for surgery.  I have reviewed the patient's chart and labs.  Questions were answered to the patient's satisfaction.     Krystofer Hevener

## 2024-02-22 NOTE — Anesthesia Postprocedure Evaluation (Signed)
 Anesthesia Post Note  Patient: Daryl Shelton  Procedure(s) Performed: CHOLECYSTECTOMY, ROBOT-ASSISTED, LAPAROSCOPIC REPAIR, HERNIA, UMBILICAL, ADULT (Abdomen)  Patient location during evaluation: PACU Anesthesia Type: General Level of consciousness: awake and alert Pain management: pain level controlled Vital Signs Assessment: post-procedure vital signs reviewed and stable Respiratory status: spontaneous breathing, nonlabored ventilation, respiratory function stable and patient connected to nasal cannula oxygen Cardiovascular status: blood pressure returned to baseline and stable Postop Assessment: no apparent nausea or vomiting Anesthetic complications: no   No notable events documented.   Last Vitals:  Vitals:   02/22/24 1015 02/22/24 1030  BP: (!) 94/59 93/76  Pulse: 79 83  Resp: 14 20  Temp:    SpO2: 96% 94%    Last Pain:  Vitals:   02/22/24 1038  TempSrc:   PainSc: 5                  Lendia LITTIE Mae

## 2024-02-22 NOTE — Op Note (Addendum)
 Procedure Date:  02/22/2024  Pre-operative Diagnosis:  Symptomatic cholelithiasis  Post-operative Diagnosis: Acute cholecystitis, reducible umbilical hernia 1 cm.  Procedure:  Robotic assisted cholecystectomy with ICG FireFly cholangiogram; open umbilical hernia repair.  Surgeon:  Aloysius Sheree Plant, MD  Anesthesia:  General endotracheal  Estimated Blood Loss:  30 ml  Specimens:  gallbladder  Complications:  None  Indications for Procedure:  This is a 63 y.o. male who presents with abdominal pain and workup revealing symptomatic cholelithiasis.  The benefits, complications, treatment options, and expected outcomes were discussed with the patient. The risks of bleeding, infection, recurrence of symptoms, failure to resolve symptoms, bile duct damage, bile duct leak, retained common bile duct stone, bowel injury, and need for further procedures were all discussed with the patient and he was willing to proceed.  Description of Procedure: The patient was correctly identified in the preoperative area and brought into the operating room.  The patient was placed supine with VTE prophylaxis in place.  Appropriate time-outs were performed.  Anesthesia was induced and the patient was intubated.  Appropriate antibiotics were infused.  The abdomen was prepped and draped in a sterile fashion. The patient was noted to have an umbilical hernia.  An supraumbilical incision was made. Cautery was used to dissect along the umbilical stalk and to separate the umbilicus from the underlying fascia.  This revealed a small 1 cm defect containing preperitoneal fat.  The fat was resected, the fascial edges cleared, and a 12 mm port was inserted.  Pneumoperitoneum was obtained with appropriate opening pressures.  Three 8-mm ports were placed in the mid abdomen at the level of the umbilicus under direct visualization.  The DaVinci platform was docked, camera targeted, and instruments were placed under direct  visualization.  The gallbladder was identified.  There were adhesions from the omentum to the gallbladder body.  The gallbladder was also found to be edematous.  The fundus was grasped and retracted cephalad.  Adhesions were lysed bluntly and with electrocautery. The infundibulum was grasped and retracted laterally, exposing the peritoneum overlying the gallbladder.  This was incised with electrocautery and extended on either side of the gallbladder.  FireFly cholangiogram was then obtained, and we were able to clearly identify the cystic duct and common bile duct.  However, the gallbladder itself did not fill, consistent with cholecystitis.  The cystic duct and cystic artery were carefully dissected with combination of cautery and blunt dissection.  Both were clipped twice proximally and once distally, cutting in between.  The gallbladder was taken from the gallbladder fossa in a retrograde fashion with electrocautery. While doing so, due to the inflammation of the wall of the gallbladder, three cholecystotomies were noted as cautery was being used to take the gallbladder off.  There was very dark and thick bile within the gallbladder that spilled.  This was suctioned.  After removing the gallbladder, the gallbladder was placed in an Endocatch bag. The liver bed was inspected and any bleeding was controlled with electrocautery. The right upper quadrant was then inspected again revealing intact clips, no bleeding, and no ductal injury.  The area was thoroughly irrigated until clear fluid return.  A 19 Fr. Blake drain was inserted via the right lateral port going to the gallbladder fossa and RUQ.  The 8 mm ports were removed under direct visualization and the 12 mm port was removed.  The Endocatch bag was brought out via the umbilical incision. The hernia defect was closed using 0 vicryl sutures.  The umbilical  stalk was reattached to the fascia using 2-0 Vicryl sutures.  Local anesthetic was infused in all  incisions and the incisions were closed with 4-0 Monocryl.  The 19 Fr. Blake drain was secured using 2-0 Nylon suture and dressed with 4x4 gauze and TegaDerm.  The other wounds were cleaned and sealed with DermaBond.  The patient was emerged from anesthesia and extubated and brought to the recovery room for further management.  The patient tolerated the procedure well and all counts were correct at the end of the case.   Aloysius Sheree Plant, MD

## 2024-02-22 NOTE — Anesthesia Procedure Notes (Signed)
 Procedure Name: Intubation Date/Time: 02/22/2024 7:37 AM  Performed by: Jackye Spanner, CRNAPre-anesthesia Checklist: Patient identified, Patient being monitored, Timeout performed, Emergency Drugs available and Suction available Patient Re-evaluated:Patient Re-evaluated prior to induction Oxygen Delivery Method: Circle system utilized Preoxygenation: Pre-oxygenation with 100% oxygen Induction Type: IV induction Ventilation: Mask ventilation without difficulty Laryngoscope Size: McGrath and 4 Grade View: Grade I Tube type: Oral Tube size: 7.5 mm Number of attempts: 1 Airway Equipment and Method: Stylet Placement Confirmation: ETT inserted through vocal cords under direct vision, positive ETCO2 and breath sounds checked- equal and bilateral Secured at: 21 cm Tube secured with: Tape Dental Injury: Teeth and Oropharynx as per pre-operative assessment  Comments: Smooth atraumatic intubation, no complications noted.

## 2024-02-23 ENCOUNTER — Encounter: Payer: Self-pay | Admitting: Surgery

## 2024-02-23 LAB — SURGICAL PATHOLOGY

## 2024-02-25 ENCOUNTER — Other Ambulatory Visit: Payer: Self-pay

## 2024-02-25 ENCOUNTER — Emergency Department: Admission: EM | Admit: 2024-02-25 | Discharge: 2024-02-25 | Disposition: A | Discharge status: Home / Self Care

## 2024-02-25 ENCOUNTER — Emergency Department

## 2024-02-25 DIAGNOSIS — I10 Essential (primary) hypertension: Secondary | ICD-10-CM | POA: Insufficient documentation

## 2024-02-25 DIAGNOSIS — Z21 Asymptomatic human immunodeficiency virus [HIV] infection status: Secondary | ICD-10-CM | POA: Insufficient documentation

## 2024-02-25 DIAGNOSIS — Z9889 Other specified postprocedural states: Secondary | ICD-10-CM

## 2024-02-25 DIAGNOSIS — K429 Umbilical hernia without obstruction or gangrene: Secondary | ICD-10-CM | POA: Diagnosis not present

## 2024-02-25 DIAGNOSIS — L03311 Cellulitis of abdominal wall: Secondary | ICD-10-CM | POA: Diagnosis not present

## 2024-02-25 DIAGNOSIS — T8140XA Infection following a procedure, unspecified, initial encounter: Secondary | ICD-10-CM | POA: Insufficient documentation

## 2024-02-25 DIAGNOSIS — Y828 Other medical devices associated with adverse incidents: Secondary | ICD-10-CM | POA: Insufficient documentation

## 2024-02-25 DIAGNOSIS — L539 Erythematous condition, unspecified: Secondary | ICD-10-CM

## 2024-02-25 DIAGNOSIS — R944 Abnormal results of kidney function studies: Secondary | ICD-10-CM | POA: Diagnosis not present

## 2024-02-25 LAB — CBC WITH DIFFERENTIAL/PLATELET
Abs Immature Granulocytes: 0.05 K/uL (ref 0.00–0.07)
Basophils Absolute: 0 K/uL (ref 0.0–0.1)
Basophils Relative: 0 %
Eosinophils Absolute: 0.1 K/uL (ref 0.0–0.5)
Eosinophils Relative: 2 %
HCT: 39.3 % (ref 39.0–52.0)
Hemoglobin: 12.6 g/dL — ABNORMAL LOW (ref 13.0–17.0)
Immature Granulocytes: 1 %
Lymphocytes Relative: 24 %
Lymphs Abs: 1.7 K/uL (ref 0.7–4.0)
MCH: 31.1 pg (ref 26.0–34.0)
MCHC: 32.1 g/dL (ref 30.0–36.0)
MCV: 97 fL (ref 80.0–100.0)
Monocytes Absolute: 0.5 K/uL (ref 0.1–1.0)
Monocytes Relative: 8 %
Neutro Abs: 4.8 K/uL (ref 1.7–7.7)
Neutrophils Relative %: 65 %
Platelets: 299 K/uL (ref 150–400)
RBC: 4.05 MIL/uL — ABNORMAL LOW (ref 4.22–5.81)
RDW: 12.6 % (ref 11.5–15.5)
WBC: 7.2 K/uL (ref 4.0–10.5)
nRBC: 0 % (ref 0.0–0.2)

## 2024-02-25 LAB — COMPREHENSIVE METABOLIC PANEL WITH GFR
ALT: 28 U/L (ref 0–44)
AST: 28 U/L (ref 15–41)
Albumin: 3.6 g/dL (ref 3.5–5.0)
Alkaline Phosphatase: 71 U/L (ref 38–126)
Anion gap: 11 (ref 5–15)
BUN: 33 mg/dL — ABNORMAL HIGH (ref 8–23)
CO2: 28 mmol/L (ref 22–32)
Calcium: 9.6 mg/dL (ref 8.9–10.3)
Chloride: 97 mmol/L — ABNORMAL LOW (ref 98–111)
Creatinine, Ser: 1.63 mg/dL — ABNORMAL HIGH (ref 0.61–1.24)
GFR, Estimated: 47 mL/min — ABNORMAL LOW (ref >60)
Glucose, Bld: 115 mg/dL — ABNORMAL HIGH (ref 70–99)
Potassium: 4.7 mmol/L (ref 3.5–5.1)
Sodium: 136 mmol/L (ref 135–145)
Total Bilirubin: 0.5 mg/dL (ref 0.0–1.2)
Total Protein: 7.4 g/dL (ref 6.5–8.1)

## 2024-02-25 LAB — LACTIC ACID, PLASMA: Lactic Acid, Venous: 1.3 mmol/L (ref 0.5–1.9)

## 2024-02-25 MED ORDER — SODIUM CHLORIDE 0.9 % IV BOLUS
1000.0000 mL | Freq: Once | INTRAVENOUS | Status: AC
  Administered 2024-02-25: 1000 mL via INTRAVENOUS

## 2024-02-25 MED ORDER — IOHEXOL 300 MG/ML  SOLN
100.0000 mL | Freq: Once | INTRAMUSCULAR | Status: AC | PRN
  Administered 2024-02-25: 100 mL via INTRAVENOUS

## 2024-02-25 MED ORDER — DOXYCYCLINE HYCLATE 100 MG PO TABS
100.0000 mg | ORAL_TABLET | Freq: Once | ORAL | Status: AC
  Administered 2024-02-25: 100 mg via ORAL
  Filled 2024-02-25: qty 1

## 2024-02-25 MED ORDER — DOXYCYCLINE HYCLATE 100 MG PO TABS: 100.0000 mg | ORAL_TABLET | Freq: Two times a day (BID) | ORAL | 0 refills | Status: AC

## 2024-02-25 NOTE — ED Provider Notes (Signed)
 [facility] Provider Note    Event Date/Time   First MD Initiated Contact with Patient 02/25/24 1535     (approximate)   History   Post-op Problem   HPI  Daryl Shelton is a 63 y.o. male  with a pmh of HIV (on antiretorvirals), HCC, HTN, ETOH depedence (previous) who presents POD 3 from  Robotic assisted cholecystectomy with ICG FireFly cholangiogram; open umbilical hernia repair [facility]) who presents to the emergency department with 3 days of worsening redness around his laparoscopic incisions, 1 day of decreased output from his JP drain and concerned that the JP drain was pulled out a little bit.  The JP drain still hold suction for approximately 4 hour. Patient states that he feels more distended but denies any progressively worsening abdominal pain.  He has not been febrile.  He has not had a bowel movement since the surgery.  Denies any chest pain shortness of breath.  His husband is at bedside who contributes to the history      Physical Exam   Triage Vital Signs: ED Triage Vitals  Encounter Vitals Group     BP 02/25/24 1526 94/64     Girls Systolic BP Percentile --      Girls Diastolic BP Percentile --      Boys Systolic BP Percentile --      Boys Diastolic BP Percentile --      Pulse Rate 02/25/24 1526 83     Resp 02/25/24 1526 16     Temp 02/25/24 1526 98.2 F (36.8 C)     Temp Source 02/25/24 1526 Oral     SpO2 02/25/24 1526 97 %     Weight 02/25/24 1529 214 lb (97.1 kg)     Height 02/25/24 1529 5' 9 (1.753 m)     Head Circumference --      Peak Flow --      Pain Score 02/25/24 1526 4     Pain Loc --      Pain Education --      Exclude from Growth Chart --     Most recent vital signs: Vitals:   02/25/24 1545 02/25/24 1838  BP: 96/67 (!) 111/94  Pulse: 78 67  Resp:  18  Temp:    SpO2: 99% 100%    Nursing Triage Note reviewed. Vital signs reviewed and patients oxygen saturation is normoxic  General: Patient is  well nourished, well developed, awake and alert, resting comfortably in no acute distress Head: Normocephalic and atraumatic Eyes: Normal inspection, extraocular muscles intact, no conjunctival pallor Ear, nose, throat: Normal external exam Neck: Normal range of motion Respiratory: Patient is in no respiratory distress, lungs CTAB Cardiovascular: Patient is not tachycardic, RR GI: Abd soft, mildly ttp generally, no rebound or guarding.  JP drain in right lower quadrant holding suction, drain stitch attached, no distal end visible through the incision.  There is scant serosanguineous fluid.  Incision sites clean dry and intact with mild surrounding erythema that appears to be secondary to irritation with no warmth Back: Normal inspection of the back with good strength and range of motion throughout all ext Extremities: pulses intact with good cap refills, no LE pitting edema or calf tenderness Neuro: The patient is alert and oriented to person, place, and time, appropriately conversive, with 5/5 bilat UE/LE strength, no gross motor or sensory defects noted. Coordination appears to be adequate. Skin: Warm, dry, and intact Psych: normal mood and affect, no SI or  HI   Media Information  Document Information  Photos  Post procedure site  02/25/2024 15:49  Attached To:  Hospital Encounter on 02/25/24  Source Information  Nicholaus Rolland BRAVO, MD  [facility]     ED Results / Procedures / Treatments   Labs (all labs ordered are listed, but only abnormal results are displayed) Labs Reviewed  COMPREHENSIVE METABOLIC PANEL WITH GFR - Abnormal; Notable for the following components:      Result Value   Chloride 97 (*)    Glucose, Bld 115 (*)    BUN 33 (*)    Creatinine, Ser 1.63 (*)    GFR, Estimated 47 (*)    All other components within normal limits  CBC WITH DIFFERENTIAL/PLATELET - Abnormal; Notable for the following components:   RBC 4.05 (*)    Hemoglobin 12.6 (*)     All other components within normal limits  LACTIC ACID, PLASMA     EKG None  RADIOLOGY CT abd and pelvis with iv contrast: Demonstrated possible abdominal wall cellulitis on my independent review interpretation and radiologist agrees   PROCEDURES:  Critical Care performed: No  Procedures   MEDICATIONS ORDERED IN ED: Medications  sodium chloride  0.9 % bolus 1,000 mL (0 mLs Intravenous Stopped 02/25/24 1839)  iohexol  (OMNIPAQUE ) 300 MG/ML solution 100 mL (100 mLs Intravenous Contrast Given 02/25/24 1659)  doxycycline  (VIBRA -TABS) tablet 100 mg (100 mg Oral Given 02/25/24 1840)     IMPRESSION / MDM / ASSESSMENT AND PLAN / ED COURSE                                Differential diagnosis includes, but is not limited to, contact dermatitis to Dermabond, postoperative state, less likely postoperative abscess fluid collection or abdominal wall cellulitis  ED course: Patient is well-appearing and abdominal exam without any evidence of peritonitis.  Erythema surrounding incision sites appear to be more consistent with an irritation as opposed to an overt cellulitis.  Patient does not have a leukocytosis.  His creatinine is slightly more elevated than baseline and I will give him a liter of IV fluid.  He does not have a lactic acid.  Will obtain a CT abdomen pelvis and discuss with his general surgery team   Clinical Course as of 02/26/24 0018  Sun Feb 25, 2024  1616 WBC: 7.2 No leukocytosis [HD]  1616 HCT: 39.3 No profound anemia [HD]  1616 Creatinine(!): 1.63 Slightly more elevated than baseline, will provide some ivf [HD]  1635 Lactic Acid, Venous: 1.3 Reassuring, no elevated lactate [HD]  1815 CT ABDOMEN PELVIS W CONTRAST Possibly consistent with abdominal wall cellulitis, will discuss with the general surgery team (Dr. Desiderio prim)  [HD]  [phone] Case discussed with surgeon on-call and given how inflamed the gallbladder was, decision made to cover with MRSA.  Patient will  continue his other antibiotic we will add on doxycycline .  We will counseled the patient on C. difficile precautions and he will follow-up in clinic early next week [HD]    Clinical Course User Index [HD] Nicholaus Rolland BRAVO, MD   Risk: 5 This patient has a high risk of morbidity due to further diagnostic testing or treatment. Rationale: This patient's evaluation and management involve a high risk of morbidity due to the potential severity of presenting symptoms, need for diagnostic testing, and/or initiation of treatment that may require close monitoring. The differential includes conditions with potential for significant deterioration or  requiring escalation of care. Treatment decisions in the ED, including medication administration, procedural interventions, or disposition planning, reflect this level of risk. Additional Support: -- Drug therapy requiring intensive monitoring for toxicity [ ]  -- Decision regarding elective major surgery with idenitified patient or procedure risk factors [ ]  -- Decision regarding hospitalization or escalation of hospital-level care [ ]  -- Decision not to resuscitate or to de-escalate care because of poor prognosis [ ]  -- Parental controlled substances [ ]   COPA: 5 The patient has a severe exacerbation, progression, or side effect of treatment of the following illness/illnesses: []  OR  The patient has the following acute or chronic illness/injury that poses a possible threat to life or bodily function: [X] : The patient has a potentially serious acute condition or an acute exacerbation of a chronic illness requiring urgent evaluation and management in the Emergency Department. The clinical presentation necessitates immediate consideration of life-threatening or function-threatening diagnoses, even if they are ultimately ruled out.  Data(2/3 categories following were performed): [5 I reviewed or ordered at least three unique tests, external notes, and/or the history  required an independent historian as one of the three requirements as following: CBC, CMP, lactic acid AND  I independently interpreted the following test: CT abdomen pelvis with IV contrast OR  I discussed the management of the patient with the following external physician or qualified healthcare provider: General surgery consultant    Suggested E/M Coding Level: 5, 99285, This has been selected based on the 03-24-2022 CPT guidelines for E/M codes in the Emergency Department based on 2/3 of the CoPA, Data, and Risk.    FINAL CLINICAL IMPRESSION(S) / ED DIAGNOSES   Final diagnoses:  Postoperative state  Erythema  Abdominal wall cellulitis     Rx / DC Orders   ED Discharge Orders          Ordered    doxycycline  (VIBRA -TABS) 100 MG tablet  2 times daily        02/25/24 1827             Note:  This document was prepared using Dragon voice recognition software and may include unintentional dictation errors.   Nicholaus Rolland BRAVO, MD 02/26/24 [phone]

## 2024-02-25 NOTE — ED Notes (Signed)
 Patient transported to CT

## 2024-02-25 NOTE — Discharge Instructions (Signed)
 You were seen in the emergency department for a postoperative state.  Workup was possibly concerning for abdominal wall cellulitis.  Your case was discussed with the surgeon and decision made to start you on an additional antibiotic.  Please take this as prescribed.  Be aware that I expect some diarrhea with both antibiotics however monitor for signs and symptoms of C. difficile.  Be aware that doxycycline  can make you sun sensitive.  Take this medication with a copious amount of water and sitting upright for 30 minutes as it can cause a pill esophagitis.  Follow-up with your surgeon early next week.  Return with any acutely worsening symptoms or any other emergency.  It was very nice meeting you both you and your husband and I wish you the best! Rolland Moats, M.D., Ph.D.  --- RETURN PRECAUTIONS & AFTERCARE: (ENGLISH) RETURN PRECAUTIONS: Return immediately to the emergency department or see/call your doctor if you feel worse, weak or have changes in speech or vision, are short of breath, have fever, vomiting, pain, bleeding or dark stool, trouble urinating or any new issues. Return here or see/call your doctor if not improving as expected for your suspected condition. FOLLOW-UP CARE: Call your doctor and/or any doctors we referred you to for more advice and to make an appointment. Do this today, tomorrow or after the weekend. Some doctors only take PPO insurance so if you have HMO insurance you may want to contact your HMO or your regular doctor for referral to a specialist within your plan. Either way tell the doctor's office that it was a referral from the emergency department so you get the soonest possible appointment.  YOUR TEST RESULTS: Take result reports of any blood or urine tests, imaging tests and EKG's to your doctor and any referral doctor. Have any abnormal tests repeated. Your doctor or a referral doctor can let you know when this should be done. Also make sure your doctor contacts this  hospital to get any test results that are not currently available such as cultures or special tests for infection and final imaging reports, which are often not available at the time you leave the ER but which may list additional important findings that are not documented on the preliminary report. BLOOD PRESSURE: If your blood pressure was greater than 120/80 have your blood pressure rechecked within 1 to 2 weeks. MEDICATION SIDE EFFECTS: Do not drive, walk, bike, take the bus, etc. if you have received or are being prescribed any sedating medications such as those for pain or anxiety or certain antihistamines like Benadryl. If you have been give one of these here get a taxi home or have a friend drive you home. Ask your pharmacist to counsel you on potential side effects of any new medication

## 2024-02-25 NOTE — ED Triage Notes (Addendum)
 Pt to ED POV with partner for post op problem. Had laporoscopic surgery 3 days ago--GB removed and hernia repair. JP drain not draining since this AM and also one of the surgical puncture wounds is red and warm per partner. Is on Oxycontin  and amoxicillin .

## 2024-02-28 ENCOUNTER — Encounter: Payer: Self-pay | Admitting: Surgery

## 2024-02-28 ENCOUNTER — Ambulatory Visit (INDEPENDENT_AMBULATORY_CARE_PROVIDER_SITE_OTHER): Admitting: Surgery

## 2024-02-28 VITALS — BP 119/79 | HR 103 | Temp 98.0°F | Ht 69.0 in | Wt 211.2 lb

## 2024-02-28 DIAGNOSIS — K8000 Calculus of gallbladder with acute cholecystitis without obstruction: Secondary | ICD-10-CM

## 2024-02-28 DIAGNOSIS — K8001 Calculus of gallbladder with acute cholecystitis with obstruction: Secondary | ICD-10-CM

## 2024-02-28 DIAGNOSIS — Z09 Encounter for follow-up examination after completed treatment for conditions other than malignant neoplasm: Secondary | ICD-10-CM

## 2024-02-28 MED ORDER — OXYCODONE HCL 5 MG PO TABS: 5.0000 mg | ORAL_TABLET | Freq: Four times a day (QID) | ORAL | 0 refills | Status: DC | PRN

## 2024-02-28 NOTE — Patient Instructions (Addendum)
 We removed your drain in the office today, you can keep a gauze over the wound until it closes up to keep from getting infection. Change dressing daily and keep clean and dry. When you take a shower, just take the dressing off and its okay for water to run over the wound. Continue taking both antibiotics. Please call the office if you have any questions or concerns    GENERAL POST-OPERATIVE PATIENT INSTRUCTIONS   WOUND CARE INSTRUCTIONS:  Keep a dry clean dressing on the wound if there is drainage. The initial bandage may be removed after 24 hours.  Once the wound has quit draining you may leave it open to air.  If clothing rubs against the wound or causes irritation and the wound is not draining you may cover it with a dry dressing during the daytime.  Try to keep the wound dry and avoid ointments on the wound unless directed to do so.  If the wound becomes bright red and painful or starts to drain infected material that is not clear, please contact your physician immediately.  If the wound is mildly pink and has a thick firm ridge underneath it, this is normal, and is referred to as a healing ridge.  This will resolve over the next 4-6 weeks.  BATHING: You may shower if you have been informed of this by your surgeon. However, Please do not submerge in a tub, hot tub, or pool until incisions are completely sealed or have been told by your surgeon that you may do so.  DIET:  You may eat any foods that you can tolerate.  It is a good idea to eat a high fiber diet and take in plenty of fluids to prevent constipation.  If you do become constipated you may want to take a mild laxative or take ducolax tablets on a daily basis until your bowel habits are regular.  Constipation can be very uncomfortable, along with straining, after recent surgery.  ACTIVITY:  You are encouraged to cough and deep breath or use your incentive spirometer if you were given one, every 15-30 minutes when awake.  This will help  prevent respiratory complications and low grade fevers post-operatively if you had a general anesthetic.  You may want to hug a pillow when coughing and sneezing to add additional support to the surgical area, if you had abdominal or chest surgery, which will decrease pain during these times.  You are encouraged to walk and engage in light activity for the next two weeks.  You should not lift more than 20 pounds for 6 weeks total after surgery as it could put you at increased risk for complications.  Twenty pounds is roughly equivalent to a plastic bag of groceries. At that time- Listen to your body when lifting, if you have pain when lifting, stop and then try again in a few days. Soreness after doing exercises or activities of daily living is normal as you get back in to your normal routine.  MEDICATIONS:  Try to take narcotic medications and anti-inflammatory medications, such as tylenol , ibuprofen, naprosyn, etc., with food.  This will minimize stomach upset from the medication.  Should you develop nausea and vomiting from the pain medication, or develop a rash, please discontinue the medication and contact your physician.  You should not drive, make important decisions, or operate machinery when taking narcotic pain medication.  SUNBLOCK Use sun block to incision area over the next year if this area will be exposed to sun.  This helps decrease scarring and will allow you avoid a permanent darkened area over your incision.  QUESTIONS:  Please feel free to call our office if you have any questions, and we will be glad to assist you. [phone]

## 2024-02-28 NOTE — Progress Notes (Signed)
 02/28/2024  HPI: Daryl Shelton is a 63 y.o. male s/p robotic assisted cholecystectomy on 02/22/24.  He was found to have acute cholecystitis intraoperatively and there was some black bile spillage intra-op.  He had a 19 fr. Blake drain in place and discharged with oral antibiotics.  He also was noted to have an umbilical hernia and this was also repaired.  He presented to the ED on 02/25/24 with concern for worsening erythema around the umbilicus.  He was given additional prescription for Doxycycline  and able to be discharged home.  Today, he reports that the umbilical area is doing better.  Denies any significant pain, skin changes other than bruising.  He has been watching his diet.  He is also asking for refill on his Oxycodone .  Vital signs: BP 119/79   Pulse (!) 103   Temp 98 F (36.7 C) (Oral)   Ht 5' 9 (1.753 m)   Wt 211 lb 3.2 oz (95.8 kg)   SpO2 98%   BMI 31.19 kg/m    Physical Exam: Constitutional:  No acute distress Abdomen:  Soft, non-distended, appropriately sore to palpation.  Incisions are dry and intact.  The umbilical area has surrounding ecchymosis and the skin of the umbilicus has ischemic changes, but no wound dehiscence.  The drain contains serosanguinous fluid.  This was removed at bedside today without complications.  Assessment/Plan: This is a 63 y.o. male s/p robotic cholecystectomy and open umbilical hernia repair.  --Discussed with the patient that the erythema from his visit in the ER is practically resolved.  Only ecchymosis continues to be present.  He should continue taking the antibiotics as prescribed until the courses are completed.   --The drain was removed today at bedside without complications.  Dry gauze dressing applied. --Will send for half refill of Oxycodone . --Follow up in 3 weeks.   Aloysius Sheree Plant, MD [facility]

## 2024-03-01 ENCOUNTER — Encounter: Admitting: Surgery

## 2024-03-13 ENCOUNTER — Other Ambulatory Visit: Payer: Self-pay

## 2024-03-13 DIAGNOSIS — R972 Elevated prostate specific antigen [PSA]: Secondary | ICD-10-CM | POA: Diagnosis not present

## 2024-03-14 LAB — PSA: Prostate Specific Ag, Serum: 5.6 ng/mL — ABNORMAL HIGH (ref 0.0–4.0)

## 2024-03-19 ENCOUNTER — Ambulatory Visit: Payer: Self-pay | Admitting: Urology

## 2024-03-20 ENCOUNTER — Encounter: Payer: Self-pay | Admitting: Physician Assistant

## 2024-03-20 ENCOUNTER — Ambulatory Visit (INDEPENDENT_AMBULATORY_CARE_PROVIDER_SITE_OTHER): Payer: Self-pay | Admitting: Surgery

## 2024-03-20 ENCOUNTER — Ambulatory Visit: Admitting: Physician Assistant

## 2024-03-20 ENCOUNTER — Encounter: Payer: Self-pay | Admitting: Surgery

## 2024-03-20 VITALS — BP 105/72 | HR 97 | Ht 68.0 in | Wt 212.2 lb

## 2024-03-20 VITALS — BP 119/82 | HR 96 | Temp 98.9°F | Ht 68.0 in | Wt 210.0 lb

## 2024-03-20 DIAGNOSIS — R972 Elevated prostate specific antigen [PSA]: Secondary | ICD-10-CM

## 2024-03-20 DIAGNOSIS — K8001 Calculus of gallbladder with acute cholecystitis with obstruction: Secondary | ICD-10-CM

## 2024-03-20 DIAGNOSIS — K8000 Calculus of gallbladder with acute cholecystitis without obstruction: Secondary | ICD-10-CM

## 2024-03-20 DIAGNOSIS — Z09 Encounter for follow-up examination after completed treatment for conditions other than malignant neoplasm: Secondary | ICD-10-CM

## 2024-03-20 NOTE — Progress Notes (Signed)
 03/20/2024 2:55 PM   Daryl Shelton [birth year] [id number]  CC: Chief Complaint  Patient presents with   Elevated PSA   HPI: Daryl Shelton is a 63 y.o. male with PMH BPH and abnormal PSA velocity who presents today for annual follow-up.  He is accompanied today by his husband, Acupuncturist.  Today he reports recent cholecystectomy with postop constipation.  This is slowly resolving.  PSA history as follows: 03/13/2024: 5.6 02/28/2023: 3.8 10/20/2022: 3.7 06/28/2022: 3.03 12/22/2021: 2.35  PMH: Past Medical History:  Diagnosis Date   Alcoholism (HCC)    Anemia    Anxiety    Arthritis    Bell's palsy    Cholelithiasis    Chronic kidney disease    Depression    GERD (gastroesophageal reflux disease)    HIV (human immunodeficiency virus infection) (HCC)    Hypertension    Pneumonia    Stroke [facility])    TIA    Surgical History: Past Surgical History:  Procedure Laterality Date   CHOLECYSTECTOMY     COLONOSCOPY W/ POLYPECTOMY     LUMBAR LAMINECTOMY/DECOMPRESSION MICRODISCECTOMY N/A 02/16/2021   Procedure: OPEN LUMBAR LAMINECTOMY, LUMBAR TWO-THREE, LUMBAR THREE-FOUR, LUMBAR FOUR-FIVE,LUMBAR FIVE-SACRAL ONE;  Surgeon: Carollee Lani BROCKS, DO;  Location: [facility] OR;  Service: Neurosurgery;  Laterality: N/A;   UMBILICAL HERNIA REPAIR N/A 02/22/2024   Procedure: REPAIR, HERNIA, UMBILICAL, ADULT;  Surgeon: Desiderio Schanz, MD;  Location: [facility] ORS;  Service: General;  Laterality: N/A;    Home Medications:  Allergies as of 03/20/2024       Reactions   Varenicline Other (See Comments)   CNS symptoms - anxiety, agitation   Pollen Extract Other (See Comments)   Congestion        Medication List        Accurate as of March 20, 2024  2:55 PM. If you have any questions, ask your nurse or doctor.          acetaminophen  500 MG tablet Commonly known as: TYLENOL  Take 2 tablets (1,000 mg total) by mouth every 6 (six) hours as needed for mild pain (pain score 1-3).   atorvastatin   20 MG tablet Commonly known as: LIPITOR Take 1 tablet (20 mg total) by mouth daily.   Biktarvy  50-200-25 MG Tabs tablet Generic drug: bictegravir-emtricitabine -tenofovir  AF Take 1 tablet by mouth daily.   diphenhydrAMINE 25 MG tablet Commonly known as: BENADRYL Take 25 mg by mouth in the morning.   DULoxetine  60 MG capsule Commonly known as: CYMBALTA  Take 1 capsule (60 mg total) by mouth daily.   gabapentin  300 MG capsule Commonly known as: NEURONTIN  Take 1 capsule (300 mg total) by mouth 3 (three) times daily.   hydrochlorothiazide  25 MG tablet Commonly known as: HYDRODIURIL  Take 1 tablet (25 mg total) by mouth daily.   lisinopril  40 MG tablet Commonly known as: ZESTRIL  Take 1 tablet (40 mg total) by mouth daily.   Multivitamin Adults 50+ Tabs Take 1 tablet by mouth daily.   omeprazole 20 MG capsule Commonly known as: PRILOSEC Take 20 mg by mouth daily.   oxyCODONE  5 MG immediate release tablet Commonly known as: Oxy IR/ROXICODONE  Take 1 tablet (5 mg total) by mouth every 6 (six) hours as needed for severe pain (pain score 7-10).   rOPINIRole  1 MG tablet Commonly known as: REQUIP  Take 1 in early afternoon and 1 at bedtime.   tadalafil  20 MG tablet Commonly known as: CIALIS  Take 1 tablet (20 mg total) by mouth daily as needed for erectile  dysfunction.        Allergies:  Allergies  Allergen Reactions   Varenicline Other (See Comments)    CNS symptoms - anxiety, agitation     Pollen Extract Other (See Comments)    Congestion     Family History: Family History  Problem Relation Age of Onset   CAD Father    Stomach cancer Sister    Prostate cancer Neg Hx     Social History:   reports that he has been smoking cigarettes. He has a 25 pack-year smoking history. He has never used smokeless tobacco. He reports that he does not currently use alcohol. He reports that he does not use drugs.  Physical Exam: BP 105/72 (BP Location: Left Arm, Patient  Position: Sitting, Cuff Size: Large)   Pulse 97   Ht 5' 8 (1.727 m)   Wt 212 lb 3.2 oz (96.3 kg)   BMI 32.26 kg/m   Constitutional:  Alert and oriented, no acute distress, nontoxic appearing HEENT: [place], AT Cardiovascular: No clubbing, cyanosis, or edema Respiratory: Normal respiratory effort, no increased work of breathing GU: Normal sphincter tone.  Smooth, symmetrically enlarged 40+ cc prostate without nodules or induration. Skin: No rashes, bruises or suspicious lesions Neurologic: Grossly intact, no focal deficits, moving all 4 extremities Psychiatric: Normal mood and affect  Laboratory Data: Results for orders placed or performed in visit on 03/13/24  PSA   Collection Time: 03/13/24  1:58 PM  Result Value Ref Range   Prostate Specific Ag, Serum 5.6 (H) 0.0 - 4.0 ng/mL   Assessment & Plan:   1. Elevated PSA (Primary) Significant rise in PSA compared to prior, concerning velocity.  Possible constipation as contributor.  I repeated a PSA today, and subsequent DRE was benign.  If PSA remains stable or significantly elevated over prior, we will recommend repeat MRI.  If downtrending, will continue to monitor closely.  He is in agreement with this plan. - PSA   Return for Will MyChart with results.  Lucie Hones, PA-C  [facility] [address] [place], [place] [address] [phone]

## 2024-03-20 NOTE — Progress Notes (Signed)
 03/20/2024  HPI: Daryl Shelton is a 63 y.o. male s/p robotic assisted cholecystectomy on 02/22/2024.  Drain was left in place and was given Augmentin  for 7 days.  He was last seen in the office on 02/28/2024 at which time his drain was removed.  He had presented to the emergency room a few days prior to that with erythema the umbilicus and was also given a course of doxycycline .  On her last visit, the erythema had resolved.  Today, the patient reports that the erythema and ecchymosis have fully resolved.  However she notices an area at the umbilicus that is firm with a scab tissue.  Denies any nausea or vomiting.  Vital signs: BP 119/82   Pulse 96   Temp 98.9 F (37.2 C) (Oral)   Ht 5' 8 (1.727 m)   Wt 210 lb (95.3 kg)   SpO2 93%   BMI 31.93 kg/m    Physical Exam: Constitutional: No acute distress Abdomen: Soft, nondistended, nontender to palpation.  Incisions are healing well and are clean, dry, and intact except for the umbilical wound.  Umbilical wound has the central component I think area of scab tissue at the base of the umbilicus.  This was sharply debrided with scissors at bedside and dressed with dry gauze dressing.  Assessment/Plan: This is a 63 y.o. male s/p robotic assisted cholecystectomy  - Patient is doing well recovering after his surgery with exception of area of scab formation at the umbilical wound.  Discussed with the patient likely this is a result of the infection that happened at the umbilicus.  This area was sharply debrided to healthier tissue and advised on dressing changes daily. - Follow-up with me in 3 weeks to reassess his wound.   Aloysius Sheree Plant, MD [facility]

## 2024-03-20 NOTE — Patient Instructions (Signed)

## 2024-03-21 ENCOUNTER — Ambulatory Visit: Payer: Self-pay | Admitting: Physician Assistant

## 2024-03-21 LAB — PSA: Prostate Specific Ag, Serum: 5.5 ng/mL — ABNORMAL HIGH (ref 0.0–4.0)

## 2024-03-25 ENCOUNTER — Other Ambulatory Visit: Payer: Self-pay

## 2024-03-25 DIAGNOSIS — R972 Elevated prostate specific antigen [PSA]: Secondary | ICD-10-CM

## 2024-04-01 ENCOUNTER — Ambulatory Visit
Admission: RE | Admit: 2024-04-01 | Discharge: 2024-04-01 | Disposition: A | Discharge status: Home / Self Care | Payer: Self-pay | Source: Ambulatory Visit | Attending: Family Medicine | Admitting: Family Medicine

## 2024-04-01 DIAGNOSIS — I10 Essential (primary) hypertension: Secondary | ICD-10-CM | POA: Insufficient documentation

## 2024-04-02 ENCOUNTER — Ambulatory Visit: Payer: Self-pay | Admitting: Family Medicine

## 2024-04-04 ENCOUNTER — Ambulatory Visit
Admission: RE | Admit: 2024-04-04 | Discharge: 2024-04-04 | Disposition: A | Discharge status: Home / Self Care | Source: Ambulatory Visit | Attending: Physician Assistant | Admitting: Physician Assistant

## 2024-04-04 DIAGNOSIS — N4289 Other specified disorders of prostate: Secondary | ICD-10-CM | POA: Diagnosis not present

## 2024-04-04 DIAGNOSIS — R972 Elevated prostate specific antigen [PSA]: Secondary | ICD-10-CM | POA: Insufficient documentation

## 2024-04-04 DIAGNOSIS — N400 Enlarged prostate without lower urinary tract symptoms: Secondary | ICD-10-CM | POA: Diagnosis not present

## 2024-04-04 MED ORDER — GADOBUTROL 1 MMOL/ML IV SOLN
10.0000 mL | Freq: Once | INTRAVENOUS | Status: AC | PRN
  Administered 2024-04-04: 10 mL via INTRAVENOUS

## 2024-04-10 ENCOUNTER — Ambulatory Visit (INDEPENDENT_AMBULATORY_CARE_PROVIDER_SITE_OTHER): Admitting: Surgery

## 2024-04-10 ENCOUNTER — Encounter: Payer: Self-pay | Admitting: Surgery

## 2024-04-10 VITALS — BP 129/85 | HR 91 | Temp 97.9°F | Ht 68.0 in | Wt 214.0 lb

## 2024-04-10 DIAGNOSIS — K8000 Calculus of gallbladder with acute cholecystitis without obstruction: Secondary | ICD-10-CM

## 2024-04-10 DIAGNOSIS — K429 Umbilical hernia without obstruction or gangrene: Secondary | ICD-10-CM

## 2024-04-10 DIAGNOSIS — Z09 Encounter for follow-up examination after completed treatment for conditions other than malignant neoplasm: Secondary | ICD-10-CM

## 2024-04-10 DIAGNOSIS — K8001 Calculus of gallbladder with acute cholecystitis with obstruction: Secondary | ICD-10-CM

## 2024-04-10 NOTE — Patient Instructions (Signed)

## 2024-04-10 NOTE — Progress Notes (Signed)
 04/10/2024  HPI: Daryl Shelton is a 63 y.o. male s/p robotic assisted cholecystectomy and open umbilical hernia repair on 02/22/2024.  Patient presents today for follow-up.  On his last visit on 03/20/2024, there was an area of scab tissue at the umbilicus which was debrided with scissors at bedside.  Today, the patient reports that this area continues to heal well and denies any abdominal pain.  He had a scab that he picked off this morning but he is very satisfied with the look of his incision now.  Vital signs: BP 129/85   Pulse 91   Temp 97.9 F (36.6 C) (Oral)   Ht 5' 8 (1.727 m)   Wt 214 lb (97.1 kg)   SpO2 95%   BMI 32.54 kg/m    Physical Exam: Constitutional: No acute distress Abdomen: Soft, nondistended, nontender to palpation.  Incisions are healing well and are clean, dry, intact.  The umbilical wound continues to heal well and now is almost fully healed.  No fibrinous material noted and no debridement is needed.  Assessment/Plan: This is a 63 y.o. male s/p robotic assisted cholecystectomy and open umbilical hernia repair.  - The patient's wounds are healing appropriately and there is no further issues at this point. - Continue diet as tolerated. - Follow-up as needed.   Aloysius Sheree Plant, MD [facility]

## 2024-04-12 ENCOUNTER — Ambulatory Visit: Payer: Self-pay | Admitting: Physician Assistant

## 2024-04-25 ENCOUNTER — Other Ambulatory Visit: Payer: Self-pay | Admitting: Family Medicine

## 2024-04-25 DIAGNOSIS — M48061 Spinal stenosis, lumbar region without neurogenic claudication: Secondary | ICD-10-CM

## 2024-04-26 NOTE — Telephone Encounter (Signed)
 Requested medication (s) are due for refill today: yes  Requested medication (s) are on the active medication list: yes  Last refill:  03/28/24 #84  Future visit scheduled: yes  Notes to clinic:  Creatinine outside normal range    Requested Prescriptions  Pending Prescriptions Disp Refills   gabapentin  (NEURONTIN ) 300 MG capsule [Pharmacy Med Name: Gabapentin  300 MG Oral Capsule] 90 capsule 0    Sig: TAKE 1 CAPSULE BY MOUTH THREE TIMES DAILY     Neurology: Anticonvulsants - gabapentin  Failed - 04/26/2024  1:32 PM      Failed - Cr in normal range and within 360 days    Creat  Date Value Ref Range Status  01/16/2024 1.81 (H) 0.70 - 1.35 mg/dL Final   Creatinine, Ser  Date Value Ref Range Status  02/25/2024 1.63 (H) 0.61 - 1.24 mg/dL Final         Passed - Completed PHQ-2 or PHQ-9 in the last 360 days      Passed - Valid encounter within last 12 months    Recent Outpatient Visits           3 months ago Annual physical exam   [facility] [place], Marsa PARAS, DO

## 2024-05-14 DIAGNOSIS — R972 Elevated prostate specific antigen [PSA]: Secondary | ICD-10-CM

## 2024-05-14 MED ORDER — DIAZEPAM 2 MG PO TABS: ORAL_TABLET | ORAL | 0 refills | Status: DC

## 2024-05-23 ENCOUNTER — Other Ambulatory Visit: Payer: Self-pay | Admitting: Family Medicine

## 2024-05-23 DIAGNOSIS — M48061 Spinal stenosis, lumbar region without neurogenic claudication: Secondary | ICD-10-CM

## 2024-05-23 DIAGNOSIS — N521 Erectile dysfunction due to diseases classified elsewhere: Secondary | ICD-10-CM

## 2024-05-24 NOTE — Telephone Encounter (Signed)
 Requested Prescriptions  Pending Prescriptions Disp Refills   gabapentin  (NEURONTIN ) 300 MG capsule [Pharmacy Med Name: Gabapentin  300 MG Oral Capsule] 90 capsule 0    Sig: TAKE 1 CAPSULE BY MOUTH THREE TIMES DAILY     Neurology: Anticonvulsants - gabapentin  Failed - 05/24/2024  3:23 PM      Failed - Cr in normal range and within 360 days    Creat  Date Value Ref Range Status  01/16/2024 1.81 (H) 0.70 - 1.35 mg/dL Final   Creatinine, Ser  Date Value Ref Range Status  02/25/2024 1.63 (H) 0.61 - 1.24 mg/dL Final         Passed - Completed PHQ-2 or PHQ-9 in the last 360 days      Passed - Valid encounter within last 12 months    Recent Outpatient Visits           4 months ago Annual physical exam   [place] [facility] [place], Marsa PARAS, DO       Future Appointments             In 2 weeks Ge[place], Penne SAUNDERS, MD [facility] [place]             tadalafil  (CIALIS ) 20 MG tablet [Pharmacy Med Name: Tadalafil  20 MG Oral Tablet] 30 tablet 0    Sig: TAKE 1 TABLET BY MOUTH ONCE DAILY AS NEEDED FOR ERECTILE DYSFUNCTION     Urology: Erectile Dysfunction Agents Passed - 05/24/2024  3:23 PM      Passed - AST in normal range and within 360 days    AST  Date Value Ref Range Status  02/25/2024 28 15 - 41 U/L Final         Passed - ALT in normal range and within 360 days    ALT  Date Value Ref Range Status  02/25/2024 28 0 - 44 U/L Final         Passed - Last BP in normal range    BP Readings from Last 1 Encounters:  04/10/24 129/85         Passed - Valid encounter within last 12 months    Recent Outpatient Visits           4 months ago Annual physical exam   [place] [place], Marsa PARAS, DO       Future Appointments             In 2 weeks Georganne, Penne SAUNDERS, MD [facility]

## 2024-05-30 NOTE — Progress Notes (Signed)
   05/30/24  Indication:  PSA 5.6 (Aug 2025) - from 2-3 baseline since 2023  MRI (04/04/24) - PIRADS 3 at right lateral mid PZ   - 46g gland  MRI Fusion Prostate Biopsy Procedure:   Informed consent was obtained, and we discussed the risks of bleeding and infection/sepsis. A time out was performed to ensure correct patient identity.  Pre-Procedure: - Last PSA Level:  Lab Results  Component Value Date   PSA 3.03 06/28/2022   PSA 2.35 12/22/2021   - Gentamicin and levaquin given for antibiotic prophylaxis - MRI images were reviewed, targets mapped and uploaded via Dynacad   - Prostate volume by MRI: 46 cc  ROI 1: PIRADS 3 at right lateral mid PZ  Procedure: - Periprostatic nerve block performed using 20 cc 1% lidocaine   - Anatomic measurements obtained revealing a 66 gm prostate, PSA density 0.08 - No significant hypoechoic lesions or median lobe noted  Next, the MRI images were registered to real-time ultrasound using UroNav fusion software with accurate contour alignment. We noted 1 target lesions. Three biopsy cores were obtained from each target lesion. Next, we obtained an additional 12-core systematic biopsy template.   Total biopsy cores: 12 systematic + 3 cores RO1 = 15 cores  Post-Procedure: - Patient tolerated the procedure well - He was counseled to seek immediate medical attention if experiences significant bleeding, fevers, urinary retention, or severe pain - Return in one week to discuss biopsy results  Assessment/ Plan: Will follow up in ~7-10 days to discuss pathology  Penne Skye, MD 05/30/2024

## 2024-06-05 ENCOUNTER — Ambulatory Visit (INDEPENDENT_AMBULATORY_CARE_PROVIDER_SITE_OTHER): Admitting: Urology

## 2024-06-05 VITALS — BP 118/76 | HR 106

## 2024-06-05 DIAGNOSIS — Z2989 Encounter for other specified prophylactic measures: Secondary | ICD-10-CM

## 2024-06-05 DIAGNOSIS — Z792 Long term (current) use of antibiotics: Secondary | ICD-10-CM

## 2024-06-05 DIAGNOSIS — R972 Elevated prostate specific antigen [PSA]: Secondary | ICD-10-CM

## 2024-06-05 MED ORDER — LIDOCAINE HCL URETHRAL/MUCOSAL 2 % EX GEL
1.0000 | Freq: Once | CUTANEOUS | Status: AC
  Administered 2024-06-05: 1 via URETHRAL

## 2024-06-05 MED ORDER — GENTAMICIN SULFATE 40 MG/ML IJ SOLN
80.0000 mg | Freq: Once | INTRAMUSCULAR | Status: AC
  Administered 2024-06-05: 80 mg via INTRAMUSCULAR

## 2024-06-05 MED ORDER — LEVOFLOXACIN 500 MG PO TABS
500.0000 mg | ORAL_TABLET | Freq: Once | ORAL | Status: AC
  Administered 2024-06-05: 500 mg via ORAL

## 2024-06-05 NOTE — Patient Instructions (Signed)

## 2024-06-05 NOTE — Progress Notes (Signed)
   06/13/2024 1:12 PM   Daryl Shelton 08-18-1960 [id number]  Reason for visit: Follow up prostate biopsy   HPI: 63 y.o. male, follow up with me today MRI fusion biopsy (06/05/24) - all cores Benign   Prior HPI: PSA 5.6 (Aug 2025) - from 2-3 baseline since 2023  MRI (04/04/24) - PIRADS 3 at right lateral mid PZ   - 46g gland    Physical Exam: BP 136/81   Pulse (!) 101    Constitutional:  Alert and oriented, No acute distress.  Laboratory Data:   PSA history: 03/20/24: 5.5 03/13/2024: 5.6 02/28/2023: 3.8 10/20/2022: 3.7 06/28/2022: 3.03 12/22/2021: 2.35  Pertinent Imaging: N/A    Assessment & Plan:    Elevated PSA, less than 10 ng/ml Assessment & Plan: History of elevated PSA (3-5.6) since 2023  -s/p negative MRI fusion biopsy (Oct 2025)   Reviewed his recent prostate biopsy pathology with him today.  All biopsy cores were benign.  Baseline PSA elevation likely secondary to mild BPH versus mild chronic prostatitis.  No further workup or diagnostics are required at this time.  He may return to routine age-based PSA screening.   - He may follow up with PCP or Urology for annual PSA screening until age ~70.   - High threshold to repeat workup unless PSA trends significantly off historic baseline  Orders: -     PSA; Future       Penne JONELLE Skye, MD  [facility] [address] [place], [place] [address] [phone]

## 2024-06-11 LAB — PROSTATE CORE NEEDLE BIOPSY

## 2024-06-13 ENCOUNTER — Ambulatory Visit (INDEPENDENT_AMBULATORY_CARE_PROVIDER_SITE_OTHER): Admitting: Urology

## 2024-06-13 VITALS — BP 136/81 | HR 101

## 2024-06-13 DIAGNOSIS — R972 Elevated prostate specific antigen [PSA]: Secondary | ICD-10-CM

## 2024-06-13 NOTE — Assessment & Plan Note (Signed)
 History of elevated PSA (3-5.6) since 2023  -s/p negative MRI fusion biopsy (Oct 2025)   Reviewed his recent prostate biopsy pathology with him today.  All biopsy cores were benign.  Baseline PSA elevation likely secondary to mild BPH versus mild chronic prostatitis.  No further workup or diagnostics are required at this time.  He may return to routine age-based PSA screening.   - He may follow up with PCP or Urology for annual PSA screening until age ~70.   - High threshold to repeat workup unless PSA trends significantly off historic baseline

## 2024-06-26 ENCOUNTER — Other Ambulatory Visit: Payer: Self-pay | Admitting: Family Medicine

## 2024-06-26 DIAGNOSIS — M48061 Spinal stenosis, lumbar region without neurogenic claudication: Secondary | ICD-10-CM

## 2024-06-28 NOTE — Telephone Encounter (Signed)
 Requested Prescriptions  Pending Prescriptions Disp Refills   gabapentin  (NEURONTIN ) 300 MG capsule [Pharmacy Med Name: Gabapentin  300 MG Oral Capsule] 270 capsule 0    Sig: TAKE 1 CAPSULE BY MOUTH THREE TIMES DAILY     Neurology: Anticonvulsants - gabapentin  Failed - 06/28/2024  4:04 PM      Failed - Cr in normal range and within 360 days    Creat  Date Value Ref Range Status  01/16/2024 1.81 (H) 0.70 - 1.35 mg/dL Final   Creatinine, Ser  Date Value Ref Range Status  02/25/2024 1.63 (H) 0.61 - 1.24 mg/dL Final         Passed - Completed PHQ-2 or PHQ-9 in the last 360 days      Passed - Valid encounter within last 12 months    Recent Outpatient Visits           5 months ago Annual physical exam   [facility] [place] Marsa PARAS, DO       Future Appointments             In 11 months Georganne, Penne SAUNDERS, MD [facility]

## 2024-06-29 ENCOUNTER — Other Ambulatory Visit: Payer: Self-pay | Admitting: Family Medicine

## 2024-06-29 DIAGNOSIS — M48061 Spinal stenosis, lumbar region without neurogenic claudication: Secondary | ICD-10-CM

## 2024-07-01 NOTE — Telephone Encounter (Signed)
 Refilled 06/28/24 # 270. Requested Prescriptions  Refused Prescriptions Disp Refills   gabapentin  (NEURONTIN ) 300 MG capsule [Pharmacy Med Name: Gabapentin  300 MG Oral Capsule] 90 capsule 0    Sig: TAKE 1 CAPSULE BY MOUTH THREE TIMES DAILY     Neurology: Anticonvulsants - gabapentin  Failed - 07/01/2024 12:02 PM      Failed - Cr in normal range and within 360 days    Creat  Date Value Ref Range Status  01/16/2024 1.81 (H) 0.70 - 1.35 mg/dL Final   Creatinine, Ser  Date Value Ref Range Status  02/25/2024 1.63 (H) 0.61 - 1.24 mg/dL Final         Passed - Completed PHQ-2 or PHQ-9 in the last 360 days      Passed - Valid encounter within last 12 months    Recent Outpatient Visits           5 months ago Annual physical exam   [place] [facility] [place] Marsa PARAS, DO       Future Appointments             In 11 months Daryl Shelton, Daryl SAUNDERS, MD [facility]

## 2024-07-09 ENCOUNTER — Encounter: Payer: Self-pay | Admitting: Family Medicine

## 2024-07-16 ENCOUNTER — Other Ambulatory Visit: Payer: Self-pay | Admitting: Family Medicine

## 2024-07-16 ENCOUNTER — Other Ambulatory Visit: Payer: Self-pay | Admitting: Infectious Diseases

## 2024-07-16 DIAGNOSIS — N521 Erectile dysfunction due to diseases classified elsewhere: Secondary | ICD-10-CM

## 2024-07-17 ENCOUNTER — Encounter: Payer: Self-pay | Admitting: Family Medicine

## 2024-07-17 ENCOUNTER — Ambulatory Visit: Admitting: Family Medicine

## 2024-07-17 ENCOUNTER — Other Ambulatory Visit: Payer: Self-pay | Admitting: Family Medicine

## 2024-07-17 VITALS — BP 112/60 | HR 106 | Ht 68.0 in | Wt 220.2 lb

## 2024-07-17 DIAGNOSIS — F17200 Nicotine dependence, unspecified, uncomplicated: Secondary | ICD-10-CM | POA: Diagnosis not present

## 2024-07-17 DIAGNOSIS — F411 Generalized anxiety disorder: Secondary | ICD-10-CM | POA: Diagnosis not present

## 2024-07-17 DIAGNOSIS — M48061 Spinal stenosis, lumbar region without neurogenic claudication: Secondary | ICD-10-CM

## 2024-07-17 DIAGNOSIS — R972 Elevated prostate specific antigen [PSA]: Secondary | ICD-10-CM | POA: Diagnosis not present

## 2024-07-17 DIAGNOSIS — B20 Human immunodeficiency virus [HIV] disease: Secondary | ICD-10-CM | POA: Diagnosis not present

## 2024-07-17 DIAGNOSIS — F3341 Major depressive disorder, recurrent, in partial remission: Secondary | ICD-10-CM

## 2024-07-17 DIAGNOSIS — G2581 Restless legs syndrome: Secondary | ICD-10-CM | POA: Diagnosis not present

## 2024-07-17 DIAGNOSIS — E782 Mixed hyperlipidemia: Secondary | ICD-10-CM | POA: Diagnosis not present

## 2024-07-17 DIAGNOSIS — I10 Essential (primary) hypertension: Secondary | ICD-10-CM | POA: Diagnosis not present

## 2024-07-17 DIAGNOSIS — G629 Polyneuropathy, unspecified: Secondary | ICD-10-CM | POA: Diagnosis not present

## 2024-07-17 DIAGNOSIS — Z0000 Encounter for general adult medical examination without abnormal findings: Secondary | ICD-10-CM

## 2024-07-17 DIAGNOSIS — F1020 Alcohol dependence, uncomplicated: Secondary | ICD-10-CM | POA: Diagnosis not present

## 2024-07-17 DIAGNOSIS — R7309 Other abnormal glucose: Secondary | ICD-10-CM

## 2024-07-17 MED ORDER — GABAPENTIN 300 MG PO CAPS: 300.0000 mg | ORAL_CAPSULE | Freq: Three times a day (TID) | ORAL | 3 refills | Status: AC

## 2024-07-17 MED ORDER — DULOXETINE HCL 60 MG PO CPEP: 60.0000 mg | ORAL_CAPSULE | Freq: Every day | ORAL | 3 refills | Status: AC

## 2024-07-17 MED ORDER — LISINOPRIL 40 MG PO TABS: 40.0000 mg | ORAL_TABLET | Freq: Every day | ORAL | 3 refills | Status: AC

## 2024-07-17 MED ORDER — BUPROPION HCL ER (XL) 150 MG PO TB24: 150.0000 mg | ORAL_TABLET | Freq: Every morning | ORAL | 3 refills | Status: AC

## 2024-07-17 MED ORDER — ROPINIROLE HCL 1 MG PO TABS: 1.0000 mg | ORAL_TABLET | Freq: Three times a day (TID) | ORAL | 1 refills | Status: AC

## 2024-07-17 MED ORDER — HYDROCHLOROTHIAZIDE 25 MG PO TABS: 25.0000 mg | ORAL_TABLET | Freq: Every day | ORAL | 3 refills | Status: AC

## 2024-07-17 MED ORDER — ATORVASTATIN CALCIUM 20 MG PO TABS: 20.0000 mg | ORAL_TABLET | Freq: Every day | ORAL | 3 refills | Status: AC

## 2024-07-17 NOTE — Progress Notes (Addendum)
 Subjective:    Patient ID: Daryl Shelton, male    DOB: 12-18-1960, 63 y.o.   MRN: [id number]  Daryl Shelton is a 63 y.o. male presenting on 07/17/2024 for Medical Management of Chronic Issues, Hypertension, Back Pain, and Elevated PSA   HPI  Discussed the use of AI scribe software for clinical note transcription with the patient, who gave verbal consent to proceed.  History of Present Illness   Daryl Shelton is a 63 year old male who presents for a follow-up visit after gallbladder surgery and to discuss recent cardiac scan results.  Post-cholecystectomy status - Underwent cholecystectomy on February 22, 2024, following a gallbladder attack on February 09, 2024 - Experienced postoperative concerns leading to an emergency room visit one week after surgery; no complications identified - Currently able to tolerate all foods without gastrointestinal symptoms - Experienced depressive symptoms postoperatively, now improved  Coronary Calcification - Recent cardiac scan showed three out of four arteries normal, with one artery demonstrating a moderate score Coronary Calcium  Score Total 478, only due to RCA 456 but overestimation - No chest pain, dyspnea, palpitations, or other cardiac symptoms - Currently taking atorvastatin  20mg  Not interested in Cardiologist  Prostate evaluation - Underwent prostate biopsy in October 2025 - Biopsy results negative for malignancy - Satisfied with care during the procedure  Tobacco use - Smokes one pack of cigarettes every three days, reduced from previous half a pack per day - Plans to switch to vaping to aid in smoking cessation - Family smokes outside to avoid indoor smoke exposure  Neuropathy and restless leg syndrome - Takes gabapentin  for neuropathy - Takes duloxetine  for neuropathy and depression - Takes ropinirole  for restless leg syndrome, adjusting dose as needed  Physical activity and lifestyle - 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 Tolerating well, w/o complaints. Denies CP, dyspnea, HA, edema, dizziness / lightheadedness   Off of weight management medication Previously trial on Contrave  Phentermine  Failed regular diet exercise regimen >6 months   Lumbar DDD S/p Spinal surgery Lumbar Laminectomy 02/16/21 Dr Dawley He had complications with numbness from waist down, has improved. - He also has some cervical spine DDD   Alcohol Abuse history Neuropathy + RLS Prior history long course of alcohol intake, then reduced amount and abstained for years R worse than Left. Both burning and also has RLS. 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 taking Ropinirole  earlier in mid afternoon, current dose 1mg  daily works well. Instead of taking at bedtime.    ED On 20mg  Tadalafil  Cialis  regularly   Major Depression recurrent, partial remission Generalized Anxiety Hx Slight Agoraphobia History of childhood trauma, contributed  Off Sertraline , Remeron  On Cymbalta  Duloxetine  60mg  daily He has practiced Mindfulness  Admits mood worsening at times, interested in other med options   HIV, chronic, controlled and undetectable Followed by Dr Fayette ([facility]) On Biktarvy    HYPERLIPIDEMIA: Last lipid panel reviewed He is on Atorvastatin  20mg        Health Maintenance:   Cologuard negative 01/14/22, repeat 3 years,  01/2025   Prevnar-20 updated 04/2022.       07/17/2024   12:29 PM 07/18/2023    9:21 AM 05/09/2023   11:02 AM  Depression screen PHQ 2/9  Decreased Interest 1 0 0   Down, Depressed, Hopeless 1 1 1   PHQ - 2 Score 2 1 1   Altered sleeping 2 1   Tired, decreased energy 1 1   Change in appetite 0 1   Feeling bad or failure about yourself  0 1   Trouble concentrating 1 0   Moving slowly or fidgety/restless 0 0   Suicidal thoughts 0 0   PHQ-9 Score 6 5       Data saved with a previous flowsheet row definition       07/18/2023    9:21 AM 01/04/2023    9:22 AM 10/06/2022   10:03 AM 12/31/2021    2:49 PM  GAD 7 : Generalized Anxiety Score  Nervous, Anxious, on Edge 1 1 1 1   Control/stop worrying 1 1 0 0  Worry too much - different things 1 1 0 1  Trouble relaxing 1 1 1  0  Restless 1 0 0 0  Easily annoyed or irritable 1 1 1  0  Afraid - awful might happen 1 1 1 1   Total GAD 7 Score 7 6 4 3   Anxiety Difficulty   Not difficult at all Not difficult at all    Social History   Tobacco Use   Smoking status: Every Day    Current packs/day: 0.50    Average packs/day: 0.5 packs/day for 50.0 years (25.0 ttl pk-yrs)    Types: Cigarettes   Smokeless tobacco: Never  Vaping Use   Vaping status: Never Used  Substance Use Topics   Alcohol use: Not Currently   Drug use: No    Review of Systems Per HPI unless specifically indicated above     Objective:    BP 112/60 (BP Location: Left Arm, Patient Position: Sitting, Cuff Size: Normal)   Pulse (!) 106   Ht 5' 8 (1.727 m)   Wt 220 lb 4 oz (99.9 kg)   SpO2 98%   BMI 33.49 kg/m   Wt Readings from Last 3 Encounters:  07/17/24 220 lb 4 oz (99.9 kg)  04/10/24 214 lb (97.1 kg)  03/20/24 210 lb (95.3 kg)    Physical Exam Vitals and nursing note reviewed.  Constitutional:      General: He is not in acute distress.    Appearance: He is well-developed. He is not diaphoretic.     Comments: Well-appearing, comfortable, cooperative  HENT:     Head: Normocephalic and atraumatic.  Eyes:     General:        Right eye: No discharge.        Left eye: No discharge.     Conjunctiva/sclera: Conjunctivae  normal.  Neck:     Thyroid : No thyromegaly.  Cardiovascular:     Rate and Rhythm: Normal rate and regular rhythm.     Pulses: Normal pulses.     Heart sounds: Normal heart sounds. No murmur heard. Pulmonary:     Effort: Pulmonary effort is normal. No respiratory distress.     Breath sounds: Normal breath sounds. No wheezing or rales.  Musculoskeletal:        General: Normal range of motion.     Cervical back: Normal range of motion and neck supple.  Lymphadenopathy:     Cervical: No cervical adenopathy.  Skin:  General: Skin is warm and dry.     Findings: No erythema or rash.  Neurological:     Mental Status: He is alert and oriented to person, place, and time. Mental status is at baseline.  Psychiatric:        Behavior: Behavior normal.     Comments: Well groomed, good eye contact, normal speech and thoughts     I have personally reviewed the radiology report from 04/01/24 on Coronary CT Calcium  Score.  CLINICAL DATA:  Risk stratification   EXAM: Coronary Calcium  Score   TECHNIQUE: The patient was scanned on a Siemens Somatom scanner. Axial non-contrast 3 mm slices were carried out through the heart. The data set was analyzed on a dedicated work station and scored using the Agatson method.   FINDINGS: Non-cardiac: See separate report from [facility].   Ascending Aorta: Normal size. Mild calcifications of the right and left coronary cusps.   Pericardium: No calcifications.   Coronary arteries: Normal origin of left and right coronary arteries. Distribution of arterial calcifications if present, as noted below;   LM 7   LAD 7   LCx 9   RCA 456 (likely somewhat overestimated due to motion artifact)   Total 478   IMPRESSION AND RECOMMENDATION: 1. Coronary calcium  score of 478. This was 84th percentile for age and sex matched control.   CAC >300 in LAD, LCx, RCA. CAC-DRS A3/N3.   CAC-DRS A0: CAC score 0: Statin generally not recommended  unless other high risk condition (e.g., FH, DM2, tobacco use, etc.)   CAC-DRS A1: CAC score 1-99: moderate intensity statin   CAC-DRS A2: CAC score 100-299: moderate to high intensity statin + ASA 81mg    CAC-DRS A3: CAC score > 300: high intensity statin + ASA 81mg    Consider cardiology consultation.   Continue heart healthy lifestyle and risk factor modification.   Electronically Signed: By: Caron Poser On: 04/01/2024 11:55  Results for orders placed or performed in visit on 06/05/24  PROSTATE CORE NEEDLE BIOPSY   Collection Time: 06/05/24 12:00 AM  Result Value Ref Range   PROSTATE CORE NEEDLE BIOPSY Comment       Assessment & Plan:   Problem List Items Addressed This Visit     Alcohol dependence, uncomplicated (HCC)   Currently asymptomatic HIV infection, with history of HIV-related illness (HCC)   Elevated PSA, less than 10 ng/ml   Essential hypertension - Primary   Relevant Medications   hydrochlorothiazide  (HYDRODIURIL ) 25 MG tablet   lisinopril  (ZESTRIL ) 40 MG tablet   atorvastatin  (LIPITOR) 20 MG tablet   GAD (generalized anxiety disorder)   Relevant Medications   DULoxetine  (CYMBALTA ) 60 MG capsule   buPROPion (WELLBUTRIN XL) 150 MG 24 hr tablet   Hyperlipidemia, mixed   Relevant Medications   hydrochlorothiazide  (HYDRODIURIL ) 25 MG tablet   lisinopril  (ZESTRIL ) 40 MG tablet   atorvastatin  (LIPITOR) 20 MG tablet   Major depressive disorder, recurrent, in partial remission   Relevant Medications   DULoxetine  (CYMBALTA ) 60 MG capsule   buPROPion (WELLBUTRIN XL) 150 MG 24 hr tablet   Peripheral polyneuropathy   Relevant Medications   gabapentin  (NEURONTIN ) 300 MG capsule   DULoxetine  (CYMBALTA ) 60 MG capsule   buPROPion (WELLBUTRIN XL) 150 MG 24 hr tablet   rOPINIRole  (REQUIP ) 1 MG tablet   Other Visit Diagnoses       Spinal stenosis of lumbar region, unspecified whether neurogenic claudication present       Relevant Medications   gabapentin   (NEURONTIN )  300 MG capsule   DULoxetine  (CYMBALTA ) 60 MG capsule     Restless leg syndrome       Relevant Medications   rOPINIRole  (REQUIP ) 1 MG tablet     Tobacco dependence            Restless legs syndrome Managed with ropinirole  2 to 2.5 pills per day with good results. Discussed potential for augmentation reverse effect with excessive dosing. Adjust rx dose - Prescribed ropinirole  1 mg, take one pill three times a day as needed, with flexibility to adjust dosage.  Polyneuropathy Peripheral Managed with duloxetine  and gabapentin . Reports occasional neuropathy symptoms during the day. - Continue duloxetine  at current dose 60mg  daily - Continue gabapentin  as needed for neuropathy symptoms.  Major depressive disorder, recurrent - partial remission Generalized Anxiety Improved Managed with duloxetine . Discussed adding Wellbutrin (bupropion) as an adjunct therapy to enhance antidepressant effects and aid in weight management. - Prescribed Wellbutrin (bupropion) XL 150mg  daily to be taken in the morning with duloxetine .  Essential hypertension Controlled on medication Hydrochlorothiazide  25mg  daily, Lisinopril  40mg  daily - Refilled antihypertensive medication for 90-day supply.  Mixed hyperlipidemia Managed with atorvastatin  20mg  daily - Continue atorvastatin  as prescribed.  Chronic Low Back Pain Lumbar DDD Stenosis History of spinal surgery laminectomy Stable on current therapy Gabapentin   Elevated PSA Followed by [facility] PSA up to 5 range S/p MRI Fusion biopsy negative On surveillance  Asymptomatic HIV  Managed by Infectious Disease Specialist, he will schedule follow-up On Biktarvy   Alcohol Dependence without complication  Tobacco use Current smoker with plans to transition to vaping. Discussed benefits of reducing smoking and transitioning to vaping to decrease health risks. - Encouraged transition to vaping and cessation of smoking.        No orders  of the defined types were placed in this encounter.   Meds ordered this encounter  Medications   gabapentin  (NEURONTIN ) 300 MG capsule    Sig: Take 1 capsule (300 mg total) by mouth 3 (three) times daily.    Dispense:  270 capsule    Refill:  3    Add extra refills   hydrochlorothiazide  (HYDRODIURIL ) 25 MG tablet    Sig: Take 1 tablet (25 mg total) by mouth daily.    Dispense:  90 tablet    Refill:  3    Add future refills   lisinopril  (ZESTRIL ) 40 MG tablet    Sig: Take 1 tablet (40 mg total) by mouth daily.    Dispense:  90 tablet    Refill:  3    Add future refills   atorvastatin  (LIPITOR) 20 MG tablet    Sig: Take 1 tablet (20 mg total) by mouth daily.    Dispense:  90 tablet    Refill:  3   DULoxetine  (CYMBALTA ) 60 MG capsule    Sig: Take 1 capsule (60 mg total) by mouth daily.    Dispense:  90 capsule    Refill:  3    Add future refills   buPROPion (WELLBUTRIN XL) 150 MG 24 hr tablet    Sig: Take 1 tablet (150 mg total) by mouth in the morning.    Dispense:  90 tablet    Refill:  3   rOPINIRole  (REQUIP ) 1 MG tablet    Sig: Take 1 tablet (1 mg total) by mouth 3 (three) times daily.    Dispense:  270 tablet    Refill:  1    Dose increase. Add future refills    Follow up  plan: Return for 6 month fasting lab > 1 week later Annual Physical.  Future labs ordered for 01/15/25  Marsa Officer, DO [facility] 07/17/2024, 8:57 AM

## 2024-07-17 NOTE — Patient Instructions (Addendum)
 Thank you for coming to the office today.  Updated all refills.  Add new med Wellbutrin XL 150mg  daily in morning for boosting mood  Adjust Ropinirole  to 1 pill 3 times a day. At max dose for restless leg  Okay to hold off on Cardiology referral at this time. We can consider it again in the future, risk is overall low to moderate / average. Good to keep on the cholesterol rx Atorvastatin .  DUE for FASTING BLOOD WORK (no food or drink after midnight before the lab appointment, only water or coffee without cream/sugar on the morning of)  SCHEDULE Lab Only visit in the morning at the clinic for lab draw in 6 MONTHS   - Make sure Lab Only appointment is at about 1 week before your next appointment, so that results will be available  For Lab Results, once available within 2-3 days of blood draw, you can can log in to MyChart online to view your results and a brief explanation. Also, we can discuss results at next follow-up visit.   Please schedule a Follow-up Appointment to: Return for 6 month fasting lab > 1 week later Annual Physical.  If you have any other questions or concerns, please feel free to call the office or send a message through MyChart. You may also schedule an earlier appointment if necessary.  Additionally, you may be receiving a survey about your experience at our office within a few days to 1 week by e-mail or mail. We value your feedback.  Marsa Officer, DO [facility], [place]

## 2024-08-14 ENCOUNTER — Other Ambulatory Visit: Payer: Self-pay | Admitting: Infectious Diseases

## 2024-08-14 NOTE — Telephone Encounter (Signed)
 Patient plans to continue Biktarvy 

## 2024-09-05 ENCOUNTER — Encounter: Payer: Self-pay | Admitting: Infectious Diseases

## 2024-09-05 ENCOUNTER — Other Ambulatory Visit: Payer: Self-pay

## 2024-09-05 ENCOUNTER — Ambulatory Visit: Attending: Infectious Diseases | Admitting: Infectious Diseases

## 2024-09-05 ENCOUNTER — Other Ambulatory Visit
Admission: RE | Admit: 2024-09-05 | Discharge: 2024-09-05 | Disposition: A | Discharge status: Home / Self Care | Source: Ambulatory Visit | Attending: Infectious Diseases | Admitting: Infectious Diseases

## 2024-09-05 VITALS — BP 144/89 | HR 95 | Temp 98.1°F | Ht 68.0 in | Wt 227.0 lb

## 2024-09-05 DIAGNOSIS — Z79899 Other long term (current) drug therapy: Secondary | ICD-10-CM | POA: Insufficient documentation

## 2024-09-05 DIAGNOSIS — Z113 Encounter for screening for infections with a predominantly sexual mode of transmission: Secondary | ICD-10-CM

## 2024-09-05 DIAGNOSIS — B20 Human immunodeficiency virus [HIV] disease: Secondary | ICD-10-CM | POA: Diagnosis present

## 2024-09-05 DIAGNOSIS — Z21 Asymptomatic human immunodeficiency virus [HIV] infection status: Secondary | ICD-10-CM | POA: Diagnosis not present

## 2024-09-05 DIAGNOSIS — G629 Polyneuropathy, unspecified: Secondary | ICD-10-CM | POA: Diagnosis not present

## 2024-09-05 DIAGNOSIS — E785 Hyperlipidemia, unspecified: Secondary | ICD-10-CM | POA: Insufficient documentation

## 2024-09-05 DIAGNOSIS — N189 Chronic kidney disease, unspecified: Secondary | ICD-10-CM | POA: Diagnosis present

## 2024-09-05 DIAGNOSIS — F32A Depression, unspecified: Secondary | ICD-10-CM | POA: Diagnosis not present

## 2024-09-05 DIAGNOSIS — F17210 Nicotine dependence, cigarettes, uncomplicated: Secondary | ICD-10-CM | POA: Insufficient documentation

## 2024-09-05 DIAGNOSIS — F419 Anxiety disorder, unspecified: Secondary | ICD-10-CM | POA: Insufficient documentation

## 2024-09-05 DIAGNOSIS — I129 Hypertensive chronic kidney disease with stage 1 through stage 4 chronic kidney disease, or unspecified chronic kidney disease: Secondary | ICD-10-CM | POA: Insufficient documentation

## 2024-09-05 LAB — COMPREHENSIVE METABOLIC PANEL WITH GFR
ALT: 30 U/L (ref 0–44)
AST: 29 U/L (ref 15–41)
Albumin: 4.5 g/dL (ref 3.5–5.0)
Alkaline Phosphatase: 84 U/L (ref 38–126)
Anion gap: 11 (ref 5–15)
BUN: 19 mg/dL (ref 8–23)
CO2: 28 mmol/L (ref 22–32)
Calcium: 9 mg/dL (ref 8.9–10.3)
Chloride: 100 mmol/L (ref 98–111)
Creatinine, Ser: 1.27 mg/dL — ABNORMAL HIGH (ref 0.61–1.24)
GFR, Estimated: >60 mL/min
Glucose, Bld: 94 mg/dL (ref 70–99)
Potassium: 4.5 mmol/L (ref 3.5–5.1)
Sodium: 139 mmol/L (ref 135–145)
Total Bilirubin: 0.3 mg/dL (ref 0.0–1.2)
Total Protein: 7.2 g/dL (ref 6.5–8.1)

## 2024-09-05 LAB — CBC WITH DIFFERENTIAL/PLATELET
Abs Immature Granulocytes: 0.03 10*3/uL (ref 0.00–0.07)
Basophils Absolute: 0 10*3/uL (ref 0.0–0.1)
Basophils Relative: 1 %
Eosinophils Absolute: 0.1 10*3/uL (ref 0.0–0.5)
Eosinophils Relative: 2 %
HCT: 39.6 % (ref 39.0–52.0)
Hemoglobin: 12.7 g/dL — ABNORMAL LOW (ref 13.0–17.0)
Immature Granulocytes: 1 %
Lymphocytes Relative: 31 %
Lymphs Abs: 1.4 10*3/uL (ref 0.7–4.0)
MCH: 30.8 pg (ref 26.0–34.0)
MCHC: 32.1 g/dL (ref 30.0–36.0)
MCV: 95.9 fL (ref 80.0–100.0)
Monocytes Absolute: 0.4 10*3/uL (ref 0.1–1.0)
Monocytes Relative: 9 %
Neutro Abs: 2.5 10*3/uL (ref 1.7–7.7)
Neutrophils Relative %: 56 %
Platelets: 193 10*3/uL (ref 150–400)
RBC: 4.13 MIL/uL — ABNORMAL LOW (ref 4.22–5.81)
RDW: 13 % (ref 11.5–15.5)
WBC: 4.5 10*3/uL (ref 4.0–10.5)
nRBC: 0 % (ref 0.0–0.2)

## 2024-09-05 MED ORDER — BIKTARVY 50-200-25 MG PO TABS: 1.0000 | ORAL_TABLET | Freq: Every day | ORAL | 6 refills | Status: AC

## 2024-09-05 NOTE — Progress Notes (Signed)
 NAME: Daryl Shelton  DOB: 1960/08/19  MRN: [id number]  Date/Time: 09/05/2024 12:44 PM  Pt is here for follow up of HIV Doing well- last seen March 2025 Last Vl 20  and cd4 872 On 100% biktarvy  - HE is doing well Concerned about his weight-especially belly fat Since I last saw him he went to see urologist for increased PSA- mildly enlarged prostate. MRI N- Dr.Brandon will see him back in 1 year   The following copied from last note Daryl Shelton is a 64 y.o. 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 Pt was  at [facility], until 2021 Nadir Cd4 unknown-as far as I can see [facility] records- 08/19/10- 629 OI -none  HAARt history Genvoya  Dont know the medicine he took before Acquired thru sex with men  [facility]  Pt has one male partner- his husband, who is positive and on HAART and undetectable Monogamous relationship ? Past Medical History:  Diagnosis Date   Alcoholism (HCC)    Anemia    Anxiety    Arthritis    Bell's palsy    Cholelithiasis    Chronic kidney disease    Depression    GERD (gastroesophageal reflux disease)    HIV (human immunodeficiency virus infection) (HCC)    Hypertension    Pneumonia    Stroke [facility])    TIA   Spinal stenosis-   PSH 02/16/2021 Bilateral L2, L3, L4, L5, S1 laminectomies for decompression of neural elements with partial bilateral L2-3, L3-4, L4-5, L5-S1 medial facetectomies  SH Smoker 1/2 PPD No alcohol since Sep 22, 2020 Was drinking before for 30 years Past h/o substance use- clean many years [place]  Lived in [place] Now in [place] for the past 73yrs  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    Family History  Problem Relation Age of Onset   CAD Father    Stomach cancer Sister    Cancer - Other Brother        anal cancer, chemo radiation   Prostate cancer Neg Hx   Twin brother  -HIV 5 sisters- 3 of 5 opioid dependence due to back pain 1 sister died of gastric carcinoma  Allergies  Allergen Reactions   Varenicline Other (See Comments)    CNS symptoms - anxiety, agitation     Pollen Extract Other (See Comments)    Congestion    ? Current Outpatient Medications  Medication Sig Dispense Refill   atorvastatin  (LIPITOR) 20 MG tablet Take by mouth.     BIKTARVY  50-200-25 MG TABS tablet TAKE 1 TABLET BY MOUTH DAILY 90 tablet 1   chlorhexidine  (PERIDEX ) 0.12 % solution 15 mLs 2 (two) times daily.     gabapentin  (NEURONTIN ) 600 MG tablet Take 1 tablet by mouth 3 (three) times daily.     hydrochlorothiazide  (HYDRODIURIL ) 25 MG tablet TAKE 1 TABLET(25 MG) BY MOUTH DAILY 90 tablet 3   ketoconazole  (NIZORAL ) 2 % cream Apply 1 application topically daily. 60 g 2   lisinopril  (ZESTRIL ) 40 MG tablet Take by mouth.           nicotine  (NICODERM CQ  - DOSED IN MG/24 HOURS) 21 mg/24hr patch Place onto the skin.           sertraline  (ZOLOFT ) 100 MG tablet Take 200 mg by mouth daily.     tadalafil  (CIALIS ) 10 MG tablet Take 1 tablet (10 mg  total) by mouth every other day as needed for erectile dysfunction. 10 tablet 1      0   Naprosyn MVT   REVIEW OF SYSTEMS:  Const: negative fever, negative chills, some weight loss Eyes: negative diplopia or visual changes, negative eye pain ENT: negative coryza, negative sore throat Resp: negative cough, hemoptysis, dyspnea Cards: negative for chest pain, palpitations, lower extremity edema GU: negative for frequency, dysuria and hematuria Skin: negative for rash and pruritus Heme: negative for easy bruising and gum/nose bleeding FD:dnfz back pain Neurolo:numbness fingers Psych:  anxiety  Objective:  VITALS:  BP (!) 144/89   Pulse 95   Temp 98.1 F (36.7 C) (Temporal)   Ht 5' 8 (1.727 m)   Wt 227 lb (103 kg)   SpO2 93%   BMI 34.52 kg/m  PHYSICAL EXAM:  General: looks well.  Head: Normocephalic, without obvious  abnormality, atraumatic. Eyes: Conjunctivae clear, anicteric sclerae. Pupils are equal Nose: Nares normal. No drainage or sinus tenderness. Throat: Lips, mucosa, and tongue normal. No Thrush Neck: symmetrical, no adenopathy, thyroid : non tender no carotid bruit and no JVD. Lungs: Clear to auscultation bilaterally. No Wheezing or Rhonchi. No rales. Heart: Regular rate and rhythm, no murmur, rub or gallop. Abdomen: Soft, non-tender,not distended. Bowel sounds normal. No masses Extremities: Extremities normal, atraumatic, no cyanosis. No edema. No clubbing Skin: No rashes or lesions. Not Jaundiced Lymph: Cervical, supraclavicular normal. Neurologic:non focal Health maintenance Vaccination Immunizations   Immunizations Name Administration Dates Next Due  COVID-19 VACCINE,MRNA(MODERNA)(PF)(IM) 11/27/2019, 10/28/2019    Hepatitis A 12/29/2010, 04/21/2010    Hepatitis B, Adult 05/10/2017, 04/21/2010, 11/04/2009, 07/08/2009    INFLUENZA INJ MDCK PF, QUAD,(FLUCELVAX)(22MO AND UP EGG FREE) 05/11/2020    INFLUENZA TIV (TRI) PF (IM) 06/23/2011, 04/21/2010, 07/08/2009    Influenza Vaccine Quad (IIV4 PF) 57mo+ injectable 05/02/2019, 05/07/2018, 05/10/2017, 05/05/2016, 10/01/2015, 08/28/2014, 05/02/2013, 06/28/2012    PNEUMOCOCCAL POLYSACCHARIDE 23 02/26/2015, 02/19/2009    PPD Test 12/29/2010, 11/04/2009    Pneumococcal Conjugate 13-Valent 03/27/2014    SHINGRIX-ZOSTER VACCINE (HZV), RECOMBINANT,SUB-UNIT,ADJUVANTED IM 02/04/2020, 08/06/2019    TdaP 02/19/2009    Tetanus-Diptheria Toxoids-TD(TDVAX),Asdorbed,2LF(IM) 08/06/2019      ______________________  Labs Lab Result  Date comment  HIV VL 110 3/24   CD4 795 3/24   Genotype     [id number]     HIV antibody     RPR NR 3/24   Quantiferon Gold NR 3/24   Hep C ab NR 6/23/   Hepatitis B-ab,ag,c Sab 124 01/28/21   Hepatitis A-IgM, IgG /T     Lipid     GC/CHL NR-rectum throat 03/11/21   PAP     HB,PLT,Cr, LFT       Preventive  Procedure  Result  Date comment  colonoscopy  06/20/2017   Mammogram     Dental exam     Opthal Y 2021 Multiple polyps- done at [facility]  05/25/19- US  abdomen- no aneurysm of aorta 05/21/18 CT lung cancer screen  Impression/Recommendation HIV- - on Biktarvy - well controlled- Vl 20, and cd4 is > 800 100% adherent Labs today  HTN on lisinopril  and HCTZ- controlled  Hyperlipidemia on atorvastatin  Anxiety/depression- zoloft ,  Spinal steosis- underwent lumbar laminectomies  L2-L5 on 02/16/21-   Peripheral neuropathy Current smoker- reduced to 1/2 PPD, also uses nicotine  patch  Seborrheic warts scalp/trunk- saw Derm and had cryotherapy and resolved  Weight gain 227 pounds Will discuss with his PCP whether he is a candidate for GLP1  Pt in a monogamous relationship - partner positive  and undetectable  _________________________________________________ Discussed with patient in detail.  Follow up 6 months-

## 2024-09-06 LAB — T-HELPER CELLS CD4/CD8 %
% CD 4 Pos. Lymph.: 61.2 % — ABNORMAL HIGH (ref 30.8–58.5)
Absolute CD 4 Helper: 857 /uL (ref 359–1519)
Basophils Absolute: 0 10*3/uL (ref 0.0–0.2)
Basos: 1 %
CD3+CD4+ Cells/CD3+CD8+ Cells Bld: 2.78 (ref 0.92–3.72)
CD3+CD8+ Cells # Bld: 308 /uL (ref 109–897)
CD3+CD8+ Cells NFr Bld: 22 % (ref 12.0–35.5)
EOS (ABSOLUTE): 0.1 10*3/uL (ref 0.0–0.4)
Eos: 2 %
Hematocrit: 37.5 % (ref 37.5–51.0)
Hemoglobin: 12.6 g/dL — ABNORMAL LOW (ref 13.0–17.7)
Immature Grans (Abs): 0 10*3/uL (ref 0.0–0.1)
Immature Granulocytes: 0 %
Lymphocytes Absolute: 1.4 10*3/uL (ref 0.7–3.1)
Lymphs: 31 %
MCH: 31.6 pg (ref 26.6–33.0)
MCHC: 33.6 g/dL (ref 31.5–35.7)
MCV: 94 fL (ref 79–97)
Monocytes Absolute: 0.4 10*3/uL (ref 0.1–0.9)
Monocytes: 8 %
Neutrophils Absolute: 2.6 10*3/uL (ref 1.4–7.0)
Neutrophils: 57 %
Platelets: 206 10*3/uL (ref 150–450)
RBC: 3.99 x10E6/uL — ABNORMAL LOW (ref 4.14–5.80)
RDW: 12.7 % (ref 11.6–15.4)
WBC: 4.6 10*3/uL (ref 3.4–10.8)

## 2024-09-06 LAB — SYPHILIS: RPR W/REFLEX TO RPR TITER AND TREPONEMAL ANTIBODIES, TRADITIONAL SCREENING AND DIAGNOSIS ALGORITHM: RPR Ser Ql: NONREACTIVE

## 2024-09-06 LAB — HIV-1 RNA QUANT-NO REFLEX-BLD
HIV 1 RNA Quant: <20 {copies}/mL
LOG10 HIV-1 RNA: UNDETERMINED {Log_copies}/mL

## 2024-09-11 ENCOUNTER — Ambulatory Visit: Payer: Self-pay

## 2025-01-15 ENCOUNTER — Other Ambulatory Visit

## 2025-01-22 ENCOUNTER — Encounter: Admitting: Family Medicine

## 2025-03-06 ENCOUNTER — Ambulatory Visit: Admitting: Infectious Diseases

## 2025-06-05 ENCOUNTER — Other Ambulatory Visit

## 2025-06-12 ENCOUNTER — Ambulatory Visit: Admitting: Urology
